# Patient Record
Sex: Female | Born: 1955
Health system: Southern US, Community
[De-identification: ages and names within clinical notes are randomized; demographics above are authoritative.]

## PROBLEM LIST (undated history)

## (undated) DIAGNOSIS — K219 Gastro-esophageal reflux disease without esophagitis: Secondary | ICD-10-CM

## (undated) DIAGNOSIS — M542 Cervicalgia: Secondary | ICD-10-CM

## (undated) DIAGNOSIS — M5416 Radiculopathy, lumbar region: Secondary | ICD-10-CM

## (undated) DIAGNOSIS — M549 Dorsalgia, unspecified: Secondary | ICD-10-CM

## (undated) DIAGNOSIS — K589 Irritable bowel syndrome without diarrhea: Secondary | ICD-10-CM

## (undated) DIAGNOSIS — T1490XA Injury, unspecified, initial encounter: Secondary | ICD-10-CM

## (undated) DIAGNOSIS — G8929 Other chronic pain: Secondary | ICD-10-CM

## (undated) DIAGNOSIS — I10 Essential (primary) hypertension: Secondary | ICD-10-CM

## (undated) DIAGNOSIS — E785 Hyperlipidemia, unspecified: Secondary | ICD-10-CM

## (undated) DIAGNOSIS — J449 Chronic obstructive pulmonary disease, unspecified: Secondary | ICD-10-CM

## (undated) DIAGNOSIS — G43909 Migraine, unspecified, not intractable, without status migrainosus: Secondary | ICD-10-CM

## (undated) DIAGNOSIS — K529 Noninfective gastroenteritis and colitis, unspecified: Secondary | ICD-10-CM

## (undated) DIAGNOSIS — K760 Fatty (change of) liver, not elsewhere classified: Secondary | ICD-10-CM

## (undated) DIAGNOSIS — Z86010 Personal history of colonic polyps: Secondary | ICD-10-CM

## (undated) HISTORY — DX: Essential (primary) hypertension: I10

## (undated) HISTORY — DX: Fatty (change of) liver, not elsewhere classified: K76.0

## (undated) HISTORY — PX: ABDOMINAL HYSTERECTOMY: SHX81

## (undated) HISTORY — DX: Gastro-esophageal reflux disease without esophagitis: K21.9

## (undated) HISTORY — PX: ROTATOR CUFF REPAIR: SHX139

## (undated) HISTORY — PX: SPINE SURGERY: SHX786

## (undated) HISTORY — DX: Hyperlipidemia, unspecified: E78.5

## (undated) HISTORY — PX: CERVICAL DISC SURGERY: SHX588

## (undated) HISTORY — DX: Personal history of colonic polyps: Z86.010

## (undated) HISTORY — PX: HERNIA REPAIR: SHX51

## (undated) HISTORY — PX: NECK SURGERY: SHX720

## (undated) HISTORY — PX: CHOLECYSTECTOMY: SHX55

## (undated) HISTORY — PX: CARPAL TUNNEL RELEASE: SHX101

## (undated) HISTORY — DX: Injury, unspecified, initial encounter: T14.90XA

## (undated) HISTORY — PX: FOOT SURGERY: SHX648

## (undated) HISTORY — DX: Noninfective gastroenteritis and colitis, unspecified: K52.9

## (undated) HISTORY — PX: FACIAL RECONSTRUCTION SURGERY: SHX631

## (undated) HISTORY — PX: FRACTURE SURGERY: SHX138

---

## 2003-04-14 DIAGNOSIS — Z86010 Personal history of colonic polyps: Secondary | ICD-10-CM

## 2003-04-14 DIAGNOSIS — Z860101 Personal history of adenomatous and serrated colon polyps: Secondary | ICD-10-CM

## 2003-04-14 HISTORY — DX: Personal history of colonic polyps: Z86.010

## 2003-04-14 HISTORY — DX: Personal history of adenomatous and serrated colon polyps: Z86.0101

## 2005-12-02 ENCOUNTER — Ambulatory Visit (HOSPITAL_COMMUNITY): Admission: RE | Admit: 2005-12-02 | Discharge: 2005-12-02 | Payer: Self-pay | Admitting: Podiatry

## 2006-02-15 ENCOUNTER — Ambulatory Visit (HOSPITAL_COMMUNITY): Admission: RE | Admit: 2006-02-15 | Discharge: 2006-02-15 | Payer: Self-pay | Admitting: Family Medicine

## 2006-04-15 ENCOUNTER — Ambulatory Visit (HOSPITAL_COMMUNITY): Admission: RE | Admit: 2006-04-15 | Discharge: 2006-04-15 | Payer: Self-pay | Admitting: Family Medicine

## 2006-06-16 ENCOUNTER — Ambulatory Visit (HOSPITAL_COMMUNITY): Admission: RE | Admit: 2006-06-16 | Discharge: 2006-06-16 | Payer: Self-pay | Admitting: Family Medicine

## 2006-07-19 ENCOUNTER — Observation Stay (HOSPITAL_COMMUNITY): Admission: RE | Admit: 2006-07-19 | Discharge: 2006-07-19 | Payer: Self-pay | Admitting: General Surgery

## 2007-03-21 ENCOUNTER — Observation Stay (HOSPITAL_COMMUNITY): Admission: RE | Admit: 2007-03-21 | Discharge: 2007-03-22 | Payer: Self-pay | Admitting: General Surgery

## 2007-04-14 DIAGNOSIS — K760 Fatty (change of) liver, not elsewhere classified: Secondary | ICD-10-CM

## 2007-04-14 HISTORY — DX: Fatty (change of) liver, not elsewhere classified: K76.0

## 2007-05-06 ENCOUNTER — Ambulatory Visit (HOSPITAL_COMMUNITY): Admission: RE | Admit: 2007-05-06 | Discharge: 2007-05-06 | Payer: Self-pay | Admitting: Family Medicine

## 2007-05-25 ENCOUNTER — Ambulatory Visit (HOSPITAL_COMMUNITY): Admission: RE | Admit: 2007-05-25 | Discharge: 2007-05-25 | Payer: Self-pay | Admitting: Family Medicine

## 2007-06-02 ENCOUNTER — Ambulatory Visit (HOSPITAL_COMMUNITY): Admission: RE | Admit: 2007-06-02 | Discharge: 2007-06-02 | Payer: Self-pay | Admitting: Family Medicine

## 2007-08-31 ENCOUNTER — Emergency Department (HOSPITAL_COMMUNITY): Admission: EM | Admit: 2007-08-31 | Discharge: 2007-08-31 | Payer: Self-pay | Admitting: Emergency Medicine

## 2007-09-08 ENCOUNTER — Ambulatory Visit: Payer: Self-pay | Admitting: Internal Medicine

## 2007-09-14 ENCOUNTER — Ambulatory Visit (HOSPITAL_COMMUNITY): Admission: RE | Admit: 2007-09-14 | Discharge: 2007-09-14 | Payer: Self-pay | Admitting: Internal Medicine

## 2007-09-14 ENCOUNTER — Encounter: Payer: Self-pay | Admitting: Internal Medicine

## 2007-09-14 ENCOUNTER — Ambulatory Visit: Payer: Self-pay | Admitting: Internal Medicine

## 2007-09-14 DIAGNOSIS — K219 Gastro-esophageal reflux disease without esophagitis: Secondary | ICD-10-CM

## 2007-09-14 HISTORY — PX: ESOPHAGOGASTRODUODENOSCOPY: SHX1529

## 2007-09-14 HISTORY — DX: Gastro-esophageal reflux disease without esophagitis: K21.9

## 2007-09-29 ENCOUNTER — Ambulatory Visit: Payer: Self-pay | Admitting: Internal Medicine

## 2007-12-07 ENCOUNTER — Ambulatory Visit: Payer: Self-pay | Admitting: Cardiology

## 2007-12-20 ENCOUNTER — Ambulatory Visit: Payer: Self-pay | Admitting: Cardiology

## 2007-12-20 ENCOUNTER — Ambulatory Visit (HOSPITAL_COMMUNITY): Admission: RE | Admit: 2007-12-20 | Discharge: 2007-12-20 | Payer: Self-pay | Admitting: Family Medicine

## 2008-01-02 ENCOUNTER — Ambulatory Visit (HOSPITAL_COMMUNITY): Admission: RE | Admit: 2008-01-02 | Discharge: 2008-01-02 | Payer: Self-pay | Admitting: Family Medicine

## 2008-01-09 ENCOUNTER — Ambulatory Visit: Payer: Self-pay | Admitting: Cardiology

## 2008-01-16 ENCOUNTER — Ambulatory Visit (HOSPITAL_COMMUNITY): Admission: RE | Admit: 2008-01-16 | Discharge: 2008-01-16 | Payer: Self-pay | Admitting: Family Medicine

## 2008-01-17 DIAGNOSIS — I10 Essential (primary) hypertension: Secondary | ICD-10-CM | POA: Insufficient documentation

## 2008-01-17 DIAGNOSIS — R079 Chest pain, unspecified: Secondary | ICD-10-CM | POA: Insufficient documentation

## 2008-01-17 DIAGNOSIS — R002 Palpitations: Secondary | ICD-10-CM | POA: Insufficient documentation

## 2008-01-17 DIAGNOSIS — E785 Hyperlipidemia, unspecified: Secondary | ICD-10-CM | POA: Insufficient documentation

## 2008-02-02 ENCOUNTER — Ambulatory Visit: Payer: Self-pay | Admitting: Cardiology

## 2008-05-01 ENCOUNTER — Ambulatory Visit: Payer: Self-pay | Admitting: Cardiology

## 2008-06-06 ENCOUNTER — Emergency Department (HOSPITAL_COMMUNITY): Admission: EM | Admit: 2008-06-06 | Discharge: 2008-06-06 | Payer: Self-pay | Admitting: Emergency Medicine

## 2008-06-13 ENCOUNTER — Ambulatory Visit (HOSPITAL_COMMUNITY): Admission: RE | Admit: 2008-06-13 | Discharge: 2008-06-13 | Payer: Self-pay | Admitting: Family Medicine

## 2008-07-12 ENCOUNTER — Ambulatory Visit (HOSPITAL_COMMUNITY): Admission: RE | Admit: 2008-07-12 | Discharge: 2008-07-12 | Payer: Self-pay | Admitting: Neurosurgery

## 2008-09-20 ENCOUNTER — Encounter: Payer: Self-pay | Admitting: Cardiology

## 2008-11-30 ENCOUNTER — Ambulatory Visit: Payer: Self-pay | Admitting: Vascular Surgery

## 2008-12-03 ENCOUNTER — Ambulatory Visit: Payer: Self-pay | Admitting: Vascular Surgery

## 2009-02-11 ENCOUNTER — Encounter (HOSPITAL_COMMUNITY): Admission: RE | Admit: 2009-02-11 | Discharge: 2009-03-13 | Payer: Self-pay | Admitting: Orthopedic Surgery

## 2009-04-15 ENCOUNTER — Ambulatory Visit (HOSPITAL_COMMUNITY): Admission: RE | Admit: 2009-04-15 | Discharge: 2009-04-15 | Payer: Self-pay | Admitting: General Surgery

## 2009-06-03 ENCOUNTER — Ambulatory Visit (HOSPITAL_COMMUNITY): Admission: RE | Admit: 2009-06-03 | Discharge: 2009-06-03 | Payer: Self-pay | Admitting: Family Medicine

## 2009-07-14 ENCOUNTER — Emergency Department (HOSPITAL_COMMUNITY): Admission: EM | Admit: 2009-07-14 | Discharge: 2009-07-14 | Payer: Self-pay | Admitting: Emergency Medicine

## 2009-07-22 ENCOUNTER — Observation Stay (HOSPITAL_COMMUNITY): Admission: RE | Admit: 2009-07-22 | Discharge: 2009-07-23 | Payer: Self-pay | Admitting: General Surgery

## 2009-08-21 ENCOUNTER — Ambulatory Visit (HOSPITAL_COMMUNITY): Admission: RE | Admit: 2009-08-21 | Discharge: 2009-08-21 | Payer: Self-pay | Admitting: Family Medicine

## 2009-09-23 DIAGNOSIS — R197 Diarrhea, unspecified: Secondary | ICD-10-CM

## 2009-09-23 DIAGNOSIS — K219 Gastro-esophageal reflux disease without esophagitis: Secondary | ICD-10-CM | POA: Insufficient documentation

## 2009-09-23 DIAGNOSIS — K589 Irritable bowel syndrome without diarrhea: Secondary | ICD-10-CM

## 2009-09-23 DIAGNOSIS — M129 Arthropathy, unspecified: Secondary | ICD-10-CM | POA: Insufficient documentation

## 2009-09-23 DIAGNOSIS — Z86010 Personal history of colonic polyps: Secondary | ICD-10-CM | POA: Insufficient documentation

## 2009-09-23 DIAGNOSIS — Z860101 Personal history of adenomatous and serrated colon polyps: Secondary | ICD-10-CM | POA: Insufficient documentation

## 2009-09-23 DIAGNOSIS — M549 Dorsalgia, unspecified: Secondary | ICD-10-CM | POA: Insufficient documentation

## 2009-09-24 ENCOUNTER — Ambulatory Visit: Payer: Self-pay | Admitting: Internal Medicine

## 2009-09-24 ENCOUNTER — Encounter: Payer: Self-pay | Admitting: Gastroenterology

## 2009-09-24 DIAGNOSIS — R1011 Right upper quadrant pain: Secondary | ICD-10-CM

## 2009-09-24 DIAGNOSIS — R1310 Dysphagia, unspecified: Secondary | ICD-10-CM | POA: Insufficient documentation

## 2009-09-26 ENCOUNTER — Ambulatory Visit (HOSPITAL_COMMUNITY): Admission: RE | Admit: 2009-09-26 | Discharge: 2009-09-26 | Payer: Self-pay | Admitting: Gastroenterology

## 2009-09-26 LAB — CONVERTED CEMR LAB
AST: 20 units/L (ref 0–37)
Albumin: 4.5 g/dL (ref 3.5–5.2)
Alkaline Phosphatase: 103 units/L (ref 39–117)
BUN: 8 mg/dL (ref 6–23)
Basophils Absolute: 0 10*3/uL (ref 0.0–0.1)
Lymphocytes Relative: 35 % (ref 12–46)
Neutro Abs: 3 10*3/uL (ref 1.7–7.7)
Neutrophils Relative %: 56 % (ref 43–77)
Platelets: 207 10*3/uL (ref 150–400)
Potassium: 4 meq/L (ref 3.5–5.3)
RDW: 15.3 % (ref 11.5–15.5)
Sodium: 138 meq/L (ref 135–145)
Tissue Transglutaminase Ab, IgA: 5.2 units (ref <20)
Total Protein: 7.3 g/dL (ref 6.0–8.3)

## 2009-09-30 ENCOUNTER — Encounter: Payer: Self-pay | Admitting: Internal Medicine

## 2009-10-21 ENCOUNTER — Telehealth (INDEPENDENT_AMBULATORY_CARE_PROVIDER_SITE_OTHER): Payer: Self-pay

## 2009-10-30 ENCOUNTER — Encounter (INDEPENDENT_AMBULATORY_CARE_PROVIDER_SITE_OTHER): Payer: Self-pay

## 2009-11-08 ENCOUNTER — Encounter: Payer: Self-pay | Admitting: Internal Medicine

## 2009-11-11 ENCOUNTER — Ambulatory Visit (HOSPITAL_COMMUNITY): Admission: RE | Admit: 2009-11-11 | Discharge: 2009-11-11 | Payer: Self-pay | Admitting: Internal Medicine

## 2009-11-11 ENCOUNTER — Telehealth (INDEPENDENT_AMBULATORY_CARE_PROVIDER_SITE_OTHER): Payer: Self-pay | Admitting: *Deleted

## 2009-11-11 ENCOUNTER — Ambulatory Visit: Payer: Self-pay | Admitting: Internal Medicine

## 2009-11-11 HISTORY — PX: ESOPHAGOGASTRODUODENOSCOPY: SHX1529

## 2009-11-12 ENCOUNTER — Telehealth (INDEPENDENT_AMBULATORY_CARE_PROVIDER_SITE_OTHER): Payer: Self-pay | Admitting: *Deleted

## 2009-11-12 ENCOUNTER — Encounter (INDEPENDENT_AMBULATORY_CARE_PROVIDER_SITE_OTHER): Payer: Self-pay | Admitting: *Deleted

## 2009-11-13 ENCOUNTER — Telehealth (INDEPENDENT_AMBULATORY_CARE_PROVIDER_SITE_OTHER): Payer: Self-pay | Admitting: *Deleted

## 2009-12-06 ENCOUNTER — Encounter: Payer: Self-pay | Admitting: Internal Medicine

## 2010-02-12 ENCOUNTER — Ambulatory Visit: Payer: Self-pay | Admitting: Internal Medicine

## 2010-02-12 DIAGNOSIS — K625 Hemorrhage of anus and rectum: Secondary | ICD-10-CM

## 2010-02-12 DIAGNOSIS — R109 Unspecified abdominal pain: Secondary | ICD-10-CM | POA: Insufficient documentation

## 2010-05-04 ENCOUNTER — Encounter: Payer: Self-pay | Admitting: Family Medicine

## 2010-05-05 ENCOUNTER — Encounter: Payer: Self-pay | Admitting: Family Medicine
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
## 2010-05-13 NOTE — Letter (Signed)
Summary: OFFICE NOTE FROM [facility]  OFFICE NOTE FROM [facility]   Imported By: Rexene Alberts 12/06/2009 09:41:10  _____________________________________________________________________  External Attachment:    Type:   Image     Comment:   External Document

## 2010-05-13 NOTE — Letter (Signed)
Summary: husband wants a call  Patient's husband desperately wants to speak with you RE: yesterday's results and further description of what could be causing her problems.  He felt as if he didn't get the entire picture at the time and is concerned previous surgeries could have caused severe damage.  Appended Document: husband wants a call Called Mr. Bo back to let him know that Dr. Jena Gauss is aware that he wishes to speak with him and that he will contact him in an appropriate time frame.  And that Mrs. Berthelot has been scheduled to see Dr. Chrisandra Carota at [facility] on 12/19/09 @ 2pm.  He documented the appt and I stated they would send her paperwork and directions.  Appt made with [facility].  Appended Document: husband wants a call called pts husband and reiterated findings and recommendations from yesterday's encounter. I answered all of his questions. He states he would like to speak with me after she has seen the  doctor at [facility]. I told him I would be glad to review the input we get from them once it becomes available.  Appended Document: husband wants a call done

## 2010-05-13 NOTE — Progress Notes (Signed)
Summary: scheduling referral to [facility] for second opinion  Phone Note From Other Clinic   Summary of Call: RMR called the office and spoke with Soledad Gerlach. He wants pt to have a referral to [facility] in the next several weeks for a second opinion. Initial call taken by: Diana Eves,  November 11, 2009 3:53 PM

## 2010-05-13 NOTE — Progress Notes (Signed)
----   Converted from flag ---- ---- 10/20/2009 6:16 PM, R. Roetta Sessions MD, Caleen Essex wrote: Discussed at length with LSL; will go aheaad and offer pt EGD/ED to see if this will help dysphagia signs;  pleease get her on schedule ---- 10/18/2009 11:29 AM, Leanna Battles. Dixon Boos wrote: Do you think we should offer EGD/ED? See OV note, had ugi series on 5/11. If so, can you please let nursing know to schedule it. ------------------------------  Appended Document:  LMOM to call.LMOM to call.  Appended Document:  LMOM to call.  Appended Document:  Mailed letter to call.

## 2010-05-13 NOTE — Letter (Signed)
Summary: referral-sarah bruce,P[facility]/Dr. Marisue Ivan bruce,[facility]/Dr. McGough   Imported By: Rosine Beat 09/30/2009 15:00:24  _____________________________________________________________________  External Attachment:    Type:   Image     Comment:   External Document

## 2010-05-13 NOTE — Letter (Signed)
Summary: Internal Other Magdalene Molly for EGD/ED  Internal Other Magdalene Molly for EGD/ED   Imported By: Cloria Spring LPN 09/81/1914 78:29:56  _____________________________________________________________________  External Attachment:    Type:   Image     Comment:   External Document

## 2010-05-13 NOTE — Letter (Signed)
Summary: Plan of Care, Need to Discuss  [facility]  [address]   [place], [place] [address]   Phone: [phone]  Fax: [phone]    October 30, 2009  [name] [address] [place], [place]  [address] October 07, 1955   Dear [name],   We are writing this letter to inform you of treatment plans and/or discuss your plan of care.  We have tried several times to contact you; however, we have yet to reach you.  We ask that you please contact our office for follow-up on your gastrointestinal issues.  We can  be reached at [phone] to schedule an appointment, or to speak with someone regarding your health care needs.  Please do not neglect your health.   Sincerely,    Cloria Spring LPN  [facility] Ph: [phone]    Fax: [phone]

## 2010-05-13 NOTE — Letter (Signed)
Summary: CT SCAN ORDER  CT SCAN ORDER   Imported By: Ave Filter 09/24/2009 11:36:27  _____________________________________________________________________  External Attachment:    Type:   Image     Comment:   External Document

## 2010-05-13 NOTE — Progress Notes (Signed)
Summary: [facility] referral DX  ---- Converted from flag ---- ---- 11/12/2009 12:52 PM, Jonathon Bellows MD, Caleen Essex wrote: regarding her abdominal wall hernias - last CT on CD ROM from here  needs to go w patient  ------------------------------
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
## 2010-05-13 NOTE — Progress Notes (Signed)
  Phone Note Call from Patient   Summary of Call: Soledad Gerlach took call from pt's husband and wanted to know the status of her appt at [facility].  He was told that Marchelle Folks was out of the office and he should call back tomorrow to speak with her.  Initial call taken by: Diana Eves,  November 13, 2009 4:08 PM     Appended Document:  Mr Speece called again this morning to speak with office manager and was told that she was not in today. He continues to tell me of his wife's condition and wants to know what is being done. I read him the note from Tuesday of his and Mandy's conversation and he does not remember this. He documented what I read about pt's referral to see Dr Barnetta Chapel on 12/19/09 @ 2pm and I gave him the number to reach their office. Mr Zarr was satisfied with that and thanked me. Call ended.  Appended Document:  Pt has an appt to see LL on 12/05/09 for further follow up for chronic diarrhea - She is scheduled for [facility] appt on 12/19/09- cdg

## 2010-05-13 NOTE — Letter (Signed)
Summary: OFFICE NOTE FROM [facility]  OFFICE NOTE FROM [facility]   Imported By: Rexene Alberts 12/06/2009 09:43:05  _____________________________________________________________________  External Attachment:    Type:   Image     Comment:   External Document

## 2010-05-16 ENCOUNTER — Emergency Department (HOSPITAL_COMMUNITY): Admit: 2010-05-16 | Discharge: 2010-05-16 | Disposition: A | Discharge status: Home / Self Care | Payer: Medicare Other

## 2010-05-16 ENCOUNTER — Emergency Department (HOSPITAL_COMMUNITY)
Admission: EM | Admit: 2010-05-16 | Discharge: 2010-05-16 | Disposition: A | Discharge status: Home / Self Care | Payer: Medicare Other | Attending: Emergency Medicine | Admitting: Emergency Medicine

## 2010-05-16 DIAGNOSIS — S301XXA Contusion of abdominal wall, initial encounter: Secondary | ICD-10-CM | POA: Insufficient documentation

## 2010-05-16 DIAGNOSIS — Y92009 Unspecified place in unspecified non-institutional (private) residence as the place of occurrence of the external cause: Secondary | ICD-10-CM | POA: Insufficient documentation

## 2010-05-16 DIAGNOSIS — S20229A Contusion of unspecified back wall of thorax, initial encounter: Secondary | ICD-10-CM | POA: Insufficient documentation

## 2010-05-16 DIAGNOSIS — W010XXA Fall on same level from slipping, tripping and stumbling without subsequent striking against object, initial encounter: Secondary | ICD-10-CM | POA: Insufficient documentation

## 2010-07-02 LAB — BASIC METABOLIC PANEL
BUN: 4 mg/dL — ABNORMAL LOW (ref 6–23)
BUN: 8 mg/dL (ref 6–23)
CO2: 31 mEq/L (ref 19–32)
Calcium: 8.6 mg/dL (ref 8.4–10.5)
Chloride: 104 mEq/L (ref 96–112)
Creatinine, Ser: 0.81 mg/dL (ref 0.4–1.2)
GFR calc Af Amer: >60 mL/min (ref >60)
Glucose, Bld: 118 mg/dL — ABNORMAL HIGH (ref 70–99)
Potassium: 4.1 mEq/L (ref 3.5–5.1)

## 2010-07-02 LAB — CBC
HCT: 38.9 % (ref 36.0–46.0)
Hemoglobin: 13.6 g/dL (ref 12.0–15.0)
MCHC: 35.1 g/dL (ref 30.0–36.0)
MCV: 91 fL (ref 78.0–100.0)
MCV: 91 fL (ref 78.0–100.0)
Platelets: 156 10*3/uL (ref 150–400)
RDW: 14 % (ref 11.5–15.5)
WBC: 5.8 10*3/uL (ref 4.0–10.5)

## 2010-07-02 LAB — DIFFERENTIAL
Basophils Absolute: 0 10*3/uL (ref 0.0–0.1)
Basophils Relative: 0 % (ref 0–1)
Eosinophils Absolute: 0 10*3/uL (ref 0.0–0.7)
Eosinophils Absolute: 0.1 10*3/uL (ref 0.0–0.7)
Eosinophils Relative: 1 % (ref 0–5)
Lymphs Abs: 1.9 10*3/uL (ref 0.7–4.0)
Monocytes Absolute: 0.4 10*3/uL (ref 0.1–1.0)
Monocytes Relative: 7 % (ref 3–12)
Neutro Abs: 8.3 10*3/uL — ABNORMAL HIGH (ref 1.7–7.7)
Neutrophils Relative %: 83 % — ABNORMAL HIGH (ref 43–77)

## 2010-07-14 LAB — BASIC METABOLIC PANEL
BUN: 9 mg/dL (ref 6–23)
Calcium: 9.4 mg/dL (ref 8.4–10.5)
GFR calc non Af Amer: >60 mL/min (ref >60)
Potassium: 4.1 mEq/L (ref 3.5–5.1)
Sodium: 137 mEq/L (ref 135–145)

## 2010-07-14 LAB — CBC
HCT: 40.5 % (ref 36.0–46.0)
Hemoglobin: 13.7 g/dL (ref 12.0–15.0)
Platelets: 170 10*3/uL (ref 150–400)
RDW: 14.3 % (ref 11.5–15.5)
WBC: 5.9 10*3/uL (ref 4.0–10.5)

## 2010-07-16 ENCOUNTER — Emergency Department (HOSPITAL_COMMUNITY): Payer: Medicare Other

## 2010-07-16 ENCOUNTER — Encounter (HOSPITAL_COMMUNITY): Payer: Self-pay | Admitting: Radiology

## 2010-07-16 ENCOUNTER — Emergency Department (HOSPITAL_COMMUNITY)
Admission: EM | Admit: 2010-07-16 | Discharge: 2010-07-16 | Disposition: A | Discharge status: Home / Self Care | Payer: Medicare Other | Attending: Emergency Medicine | Admitting: Emergency Medicine

## 2010-07-16 DIAGNOSIS — R109 Unspecified abdominal pain: Secondary | ICD-10-CM | POA: Insufficient documentation

## 2010-07-16 DIAGNOSIS — R141 Gas pain: Secondary | ICD-10-CM | POA: Insufficient documentation

## 2010-07-16 DIAGNOSIS — R143 Flatulence: Secondary | ICD-10-CM | POA: Insufficient documentation

## 2010-07-16 DIAGNOSIS — K7689 Other specified diseases of liver: Secondary | ICD-10-CM | POA: Insufficient documentation

## 2010-07-16 DIAGNOSIS — I10 Essential (primary) hypertension: Secondary | ICD-10-CM | POA: Insufficient documentation

## 2010-07-16 DIAGNOSIS — R142 Eructation: Secondary | ICD-10-CM | POA: Insufficient documentation

## 2010-07-16 DIAGNOSIS — E785 Hyperlipidemia, unspecified: Secondary | ICD-10-CM | POA: Insufficient documentation

## 2010-07-16 DIAGNOSIS — K432 Incisional hernia without obstruction or gangrene: Secondary | ICD-10-CM | POA: Insufficient documentation

## 2010-07-16 LAB — CBC
HCT: 37.5 % (ref 36.0–46.0)
Hemoglobin: 12.5 g/dL (ref 12.0–15.0)
MCV: 89.9 fL (ref 78.0–100.0)
RDW: 13.8 % (ref 11.5–15.5)
WBC: 4.5 10*3/uL (ref 4.0–10.5)

## 2010-07-16 LAB — URINALYSIS, ROUTINE W REFLEX MICROSCOPIC
Glucose, UA: NEGATIVE mg/dL
Ketones, ur: NEGATIVE mg/dL
Nitrite: NEGATIVE
Protein, ur: NEGATIVE mg/dL
Urobilinogen, UA: 0.2 mg/dL (ref 0.0–1.0)

## 2010-07-16 LAB — COMPREHENSIVE METABOLIC PANEL
ALT: 28 U/L (ref 0–35)
Alkaline Phosphatase: 85 U/L (ref 39–117)
BUN: 2 mg/dL — ABNORMAL LOW (ref 6–23)
CO2: 26 mEq/L (ref 19–32)
Chloride: 104 mEq/L (ref 96–112)
GFR calc non Af Amer: >60 mL/min (ref >60)
Glucose, Bld: 113 mg/dL — ABNORMAL HIGH (ref 70–99)
Potassium: 3.5 mEq/L (ref 3.5–5.1)
Sodium: 136 mEq/L (ref 135–145)
Total Bilirubin: 0.2 mg/dL — ABNORMAL LOW (ref 0.3–1.2)

## 2010-07-16 LAB — DIFFERENTIAL
Basophils Absolute: 0 10*3/uL (ref 0.0–0.1)
Eosinophils Relative: 3 % (ref 0–5)
Lymphocytes Relative: 28 % (ref 12–46)
Lymphs Abs: 1.3 10*3/uL (ref 0.7–4.0)
Neutro Abs: 2.7 10*3/uL (ref 1.7–7.7)

## 2010-07-16 LAB — URINE MICROSCOPIC-ADD ON

## 2010-07-16 LAB — OCCULT BLOOD, POC DEVICE: Fecal Occult Bld: NEGATIVE

## 2010-07-16 MED ORDER — IOHEXOL 300 MG/ML  SOLN
100.0000 mL | Freq: Once | INTRAMUSCULAR | Status: AC | PRN
  Administered 2010-07-16: 100 mL via INTRAVENOUS

## 2010-07-18 LAB — URINE CULTURE
Colony Count: 75000
Culture  Setup Time: 201204042109

## 2010-08-26 NOTE — H&P (Signed)
NAME:  [name], [name]            ACCOUNT NO.:  [id number]   MEDICAL RECORD NO.:  [id number]          PATIENT TYPE:  AMB   LOCATION:  DAY                           FACILITY:  [facility]   PHYSICIAN:  Dalia Heading, M.D.  DATE OF BIRTH:  [birth year]   DATE OF ADMISSION:  DATE OF DISCHARGE:  LH                              HISTORY & PHYSICAL   AGE:  55 years old.   CHIEF COMPLAINT:  Recurrent incisional hernia.   HISTORY OF PRESENT ILLNESS:  The patient is a 55 year old white female  who is status post multiple abdominal surgeries and incisional  herniorrhaphies in the past, who now presents with recurrence of a  suprapubic incisional hernia.  She did have a repair earlier this year  with mesh, but she has broken through lateral to this repair.   PAST MEDICAL HISTORY:  As noted above, arthritis.   PAST SURGICAL HISTORY:  1. Multiple extremity surgeries.  2. Hysterectomy.  3. Incisional herniorrhaphies with mesh.   CURRENT MEDICATIONS:  1. Vytorin.  2. Cyclobenzaprine.  3. Naprosyn.  4. Citalopram.   ALLERGIES:  NO KNOWN DRUG ALLERGIES.   SOCIAL HISTORY:  The patient smokes half a pack of cigarettes a day.  She denies any significant alcohol use.   REVIEW OF SYSTEMS:  She denies any other cardiopulmonary difficulties or  bleeding disorders.   PHYSICAL EXAMINATION:  GENERAL:  The patient is a well-developed, well-  nourished white female in no acute distress.  LUNGS:  Clear to auscultation with equal breath sounds bilaterally.  HEART:  Regular rate and rhythm without S3, S4 or murmurs.  ABDOMEN:  Soft and nondistended.  She is tender in the suprapubic region  with a large reducible hernia present.   IMPRESSION:  Recurrent incisional hernia.   PLAN:  The patient is scheduled for a laparoscopic recurrent incisional  herniorrhaphy with mesh on March 21, 2007.  The risks and benefits of  the procedure including bleeding, infection, bowel injury, recurrence of  the hernia,  the possibility of an open procedure were fully explained to  the patient and gave informed consent.      Dalia Heading, M.D.  Electronically Signed     MAJ/MEDQ  D:  03/17/2007  T:  03/17/2007  Job:  [id number]   cc:   Dalia Heading, M.D.  Fax: [phone]   Corrie Mckusick, M.D.  Fax: [phone]

## 2010-08-26 NOTE — Op Note (Signed)
NAMEBELKY, [name]            ACCOUNT NO.:  [id number]   MEDICAL RECORD NO.:  [id number]          PATIENT TYPE:  OBV   LOCATION:  A310                          FACILITY:  [facility]   PHYSICIAN:  Dalia Heading, M.D.  DATE OF BIRTH:  1956/04/07   DATE OF PROCEDURE:  03/21/2007  DATE OF DISCHARGE:                               OPERATIVE REPORT   PREOPERATIVE DIAGNOSIS:  Recurrent incisional hernia.   POSTOPERATIVE DIAGNOSIS:  Recurrent incisional hernia.   PROCEDURE:  Laparoscopic incisional herniorrhaphy with mesh.   SURGEON:  Dr. Franky Macho.   ASSISTANT:  Dr. Tilford Pillar.   ANESTHESIA:  General endotracheal.   INDICATIONS:  The patient is a 55 year old white female status post  multiple abdominal surgeries and incisional herniorrhaphy in the past,  who now presents with recurrent incisional hernia.  Risks and benefits  of the procedure including bleeding, infection, bowel injury, possible  recurrence of the hernia, and the possibility of an open procedure were  fully explained to the patient who gave informed consent.   PROCEDURE NOTE:  The patient was placed in the supine position.  After  induction of general endotracheal anesthesia, the abdomen was prepped  and draped in the usual sterile technique with Betadine.  Surgical site  confirmation was performed.   A small epigastric incision was made and the Veress needle was placed  into the abdominal cavity without difficulty.  Confirmation of placement  was done using the saline drop test.  The abdomen was then insufflated  to 16 mmHg pressure.  An 11 mm trocar was entered was then introduced  into the left flank under direct visualization without difficulty.  An  additional 5-mm trocar was placed on the right side of the abdominal  wall an 11-mm trocar was placed in the left lower quadrant region.  The  hernia defect was large and noted to have omentum in it.  This was freed  away and the hernia sac was excised using  the LigaSure without  difficulty.  The defect measured greater than 15 cm in its greatest  diameter.  Thus, a 15 x 20 cm piece of Proceed surgical mesh was placed.  2-0 Novofil tacking sutures were used as well as the Protacker.  No  abnormal bleeding was noted at the end of the procedure.  All air was  then evacuated from the abdominal cavity prior to removal of the  trocars.   All wounds were irrigated with normal saline.  All wounds were checked  with 0.5 cm Sensorcaine.  The 11-mm trocar site fascial edges were  reapproximated using 0 Vicryl interrupted sutures.  All skin incisions  were closed using staples and Dermabond.   All tape and needle counts correct at the end of the procedure.  The  patient was extubated in the operating room and went back to recovery  room awake in stable condition.   COMPLICATIONS:  None.   SPECIMEN:  None.   BLOOD LOSS:  Minimal.      Dalia Heading, M.D.  Electronically Signed     MAJ/MEDQ  D:  03/21/2007  T:  03/21/2007  Job:  [id number]   cc:   Corrie Mckusick, M.D.  Fax: [phone]

## 2010-08-26 NOTE — Consult Note (Signed)
NAME:  [name], [name]            ACCOUNT NO.:  [id number]   MEDICAL RECORD NO.:  [id number]          PATIENT TYPE:  AMB   LOCATION:  DAY                           FACILITY:  [facility]   PHYSICIAN:  R. Roetta Sessions, M.D. DATE OF BIRTH:  08-24-1955   DATE OF CONSULTATION:  DATE OF DISCHARGE:                                 CONSULTATION   REQUESTING PHYSICIAN:  Rhae Lerner. Margretta Ditty, MD   PRIMARY CARE PHYSICIAN:  [name], MD   REASON FOR CONSULTATION:  Abdominal pain and chronic diarrhea.   HISTORY OF PRESENT ILLNESS:  [name] is a 55 year old Caucasian  female.  She began to have severe epigastric pain about 3 months ago.  She describes this as a terrible pain.  She rates pain 10/10 on pain  scale.  She had a previous surgery for incisional hernia by Dr. Lovell Sheehan  in April 2008.  She had a second surgery on March 21, 2007, for  recurrent incisional hernia with mesh placement.  She continues to  complain of epigastric pain, which does seem to radiate to her back.  She complains of constant back pain and a chronic pressure.  She  complains of pain down her right side.  She complains of pain all the  time.  She tells me that she cannot sleep.  She is taking Percocet and  tells me it barely touches it.  She tells me that she does not take  off the edge.  She complains of nausea and has had vomiting.  On  occasion, she has had a 1-week trial of Prevacid 30 mg daily, which did  not seem to help.  She denies any history of heartburn indigestion.  Denies any dysphagia or odynophagia.  She has had anorexia.  She tells  me she has lost 20 pounds in the last 6 months.  She tells me she has  had chronic diarrhea for the last 2 months.  She is having upwards of  six stools per day, which are loose, watery, and nonbloody.  She denies  any melena.  She denies any NSAID use.  She tells me she was referred to  [facility], given her chronic back pain, but she  has not  received her appointment time yet.  She has been started on  Questran for diarrhea, this does seem to help some.   CT of the abdomen and pelvis with contrast on Aug 31, 2007, shows mild  hepatic fatty infiltration status post cholecystectomy.  Postoperative  changes; post hernia repair, anterior abdominal wall, and normal pelvis  status post hysterectomy.   PAST MEDICAL AND SURGICAL HISTORY:  Past medical and surgical history is  extensive.  She tells me she had a colonoscopy 4 years ago in [place],  which showed polyp unsure as to whether these were adenomatous.  She had  an EGD at the same time and she believes this was normal.  She has had a  partial hysterectomy followed by bilateral salpingo-oophorectomy.  She  had a cholecystectomy 3 years ago.  She has had three C-sections.  She  has had cervical  disk surgery.  She has had left rotator cuff repair and  carpal tunnel and trigger finger surgeries.  She has had a tubal  pregnancy.  She has had arthroscopy to her right knee.  She has had foot  surgery.  She has had three back surgeries.  Reconstructive face  surgery.  She had a multiple incisional herniorrhaphy with mesh  placement by Dr. Lovell Sheehan.  She has history of arthritis.   LABORATORY STUDIES:  Laboratory studies from the same date show, white  blood cell count of 5.4, hemoglobin 14.7, hematocrit 41, and platelets  187.  Lipase 27.  Sodium 137, potassium 3.9, chloride 103, carbon  dioxide 27, glucose 98, BUN 6, creatinine 0.68, calcium 9.5, total  protein 7, albumin 3.9, AST 23, and ALT 24.  Trace hemoglobin in her  urine.  MRI of lumbar spine with and without contrast on May 25, 2007, mild disk bulge at L2-L3 with nerve compression and diskogenic  edema anteriorly with in the L3 vertebral body.  Lumbar spine films on  May 06, 2007, no acute finding.   CURRENT MEDICATIONS:  Cholestyramine 2 scoops b.i.d. and Percocet p.r.n.   ALLERGIES:  DARVOCET.   FAMILY  HISTORY:  There is no known family history of colon or rectal  carcinoma.  She does have one brother who has history of alcoholism and  what sounds like hepatocellular carcinoma.  Mother of age 29 is diabetic  with hypertension.  Father is healthy.  She has 10 healthy siblings all  together.   SOCIAL HISTORY:  [name] is divorced, but has had a significant  other for about 14 years now.  She is disabled.  She smokes half-a-pack  of cigarettes per day.  She denies any alcohol or drug use.   REVIEW OF SYSTEMS:  See HPI, otherwise negative.   PHYSICAL EXAMINATION:  VITAL SIGNS:  Weight 147 pounds, height 61  inches, temperature 97.9, blood pressure 150/78, and pulse 84.  GENERAL:  [name] is a well-developed, well-nourished Caucasian  female in no acute distress.  HEENT:  Sclerae clear, nonicteric, conjunctivae pink.  Oropharynx pink  and moist without any lesions.  NECK:  Supple without any mass or thyromegaly.  HEART:  Regular rate and rhythm.  Normal S1 and S2.  No murmurs, clicks,  rubs, or gallops.  LUNGS:  Clear to auscultation bilaterally.  ABDOMEN:  Positive bowel sounds x4.  No bruits auscultated.  Soft,  nontender, and nondistended without palpable mass or hepatosplenomegaly.  No rebound tenderness or guarding.  Well-healed surgical scars.  EXTREMITIES:  Without clubbing or edema bilaterally.  SKIN:  Pink, warm, and dry without any rash or jaundice.   IMPRESSION:  [name] is a 55 year old female with a 80-month history  of severe epigastric pain and nausea.  She also has a history of chronic  back pain with multiple surgeries.  She has actually been referred for  pain management for chronic back pain and new 35-month history of chronic  nonbloody diarrhea, etiology unknown.  I suspect, she could have either  post cholecystectomy diarrhea versus microscopic colitis.  She had had  significant weight loss as well and this is going to require further  evaluation to  rule out occult colorectal carcinoma as well as peptic  ulcer disease given her epigastric pain and nausea.   PLAN:  1. I do agree that she should proceed with the Pain Clinic evaluation,      given her chronic back pain and I  have deferred her back to her      primary care physician Dr. Nobie Putnam for distribution of narcotic      pain medications, as I feel it is in her best interest to have all      of her pain medications coming from one source.  She does have      Percocet left over at this time from the emergency room.  2. Colonoscopy and EGD with Dr. Jena Gauss in near future.  I have      discussed the procedure including risks and benefits which include,      but not limited to bleeding, infection, perforation, drug reaction.      She agrees with the      plan and consent will be obtained.  3. Full set of stool studies, which include C. Diff, ova and      parasites, culture and sensitivity, and lactoferrin.   Thank you Dr. Margretta Ditty for the opportunity to participate in the care of  [name].      Lorenza Burton, N.P.      Jonathon Bellows, M.D.  Electronically Signed    KJ/MEDQ  D:  09/08/2007  T:  09/09/2007  Job:  [id number]   cc:   [name], M.D.  Fax: [phone]

## 2010-08-26 NOTE — Procedures (Signed)
NAMESEJAL, COFIELD             ACCOUNT NO.:  [id number]   MEDICAL RECORD NO.:  [id number]          PATIENT TYPE:  OUT   LOCATION:  RESP                          FACILITY:  [facility]   PHYSICIAN:  Edward L. Juanetta Gosling, M.D.DATE OF BIRTH:  Aug 11, 1955   DATE OF PROCEDURE:  DATE OF DISCHARGE:                            PULMONARY FUNCTION TEST   Pulmonary Function Test  1. Spirometry shows no ventilatory defect, but does show evidence of      airflow obstruction in the smaller airways.  2. Lung volumes are normal.  3. DLCO is normal.  4. Arterial blood gases are normal.  5. The airflow obstruction in the smaller airways is consistent with      the clinical diagnosis of COPD.      Edward L. Juanetta Gosling, M.D.  Electronically Signed     ELH/MEDQ  D:  01/03/2008  T:  01/03/2008  Job:  [id number]   cc:   Kirk Ruths, M.D.  Fax: [phone]

## 2010-08-26 NOTE — Letter (Signed)
December 07, 2007    [name], M.D.  [address]  [place], [place]  [address]   RE:  KORDELIA, SEVERIN  MRN:  [id number]  /  DOB:  [birth year]   Dear Loraine Leriche:   It was my pleasure evaluating Ms. Wack in the office today in  consultation at your request for palpitations.  As you know, this nice  woman has had wealth of medical problems, but no cardiovascular issues.  She has never been seen by a cardiologist.  She has never undergone any  significant cardiac testing.  Over the past 10 days or so, she has noted  episodes of rapid heart action associated with malaise.  These occur at  rest and resolve spontaneously after number of minutes.  There has been  no chest discomfort associated with palpitations, but she has had some  right shoulder pain radiating to the chest with associated numbness in  her right hand and a clammy sensation in the right hand.   Past medical history is notable for dyslipidemia for which she is  treated pharmacologically.  She has a history of GERD.  She was  evaluated by Dr. Jena Gauss earlier this year for chronic diarrhea with a  possible diagnosis of IBS.  Adenomatous polyps were excised in 2005.  She has had multiple surgeries including partial hysterectomy and  subsequent bilateral oophorectomy, cholecystectomy, three cesarean  sections, cervical disk surgery, left rotator cuff repair, carpal tunnel  repair and multiple lumbosacral spinal procedures following an injury.  She has required a number of surgeries for incisional hernias with a  more recent mesh placement.  She also has a history of DJD.   She has no known allergies.   MEDICATION:  Her only medication is Vytorin 10/20 mg daily.   SOCIAL HISTORY:  Disabled; recently married to a companion of 14 years;  two adult children.   FAMILY HISTORY:  Positive for diabetes, hypertension, hyperlipidemia,  and myocardial infarction in one of four siblings.   REVIEW OF SYSTEMS:  Notable for intermittent  dizziness, the need for  corrective lenses, mild chronic cough, variable weight, a somewhat  impaired appetite.  All other systems reviewed and are negative.   PHYSICAL EXAMINATION:  GENERAL:  On exam, trim somewhat anxious woman in  no acute distress.  The height is 5 feet 1 inch, weight 142 pounds.  VITAL SIGNS:  Blood pressure 170/90, heart rate 70 and regular,  respirations 16.  NECK:  No jugular venous distention; normal carotid upstrokes without  bruits.  HEENT:  Slight asymmetry to the face; EOMs full; normal lids and  conjunctivae; normal oral mucosa.  LUNGS:  Mild inspiratory and expiratory rhonchi.  CARDIAC:  Normal first and second heart sounds; modest early systolic  ejection murmur.  ABDOMEN:  Soft and nontender; normal bowel sounds; no organomegaly.  EXTREMITIES:  Normal distal pulses; no edema.  MUSCULOSKELETAL:  Tenderness over the anterior aspect of the humeral  head; some limitation of range of motion at the shoulder; neurologic  exam of the upper extremities is normal.   EKG:  Normal sinus rhythm; borderline left atrial abnormality; right  ventricular conduction delay; and occasional PVC.   Most recent laboratory available to me is from May when CBC and  chemistry profiles were normal.  She had tests drawn yesterday, but the  results are not currently available.   IMPRESSION:  Ms. Devins has episodes that are certainly consistent with  paroxysmal supraventricular tachycardia or perhaps atrial fibrillation.  A serious arrhythmia,  such as ventricular tachycardia, is much less  likely.  We will verify that thyroid function has been ascertained.  We  will proceed with an event recorder in an attempt to document rhythm  during a symptomatic spell.  The chest and shoulder discomfort that she  describes is associated with numbness in the hand is most likely  musculoskeletal in etiology.   Blood pressure is elevated at this visit.  We have asked her to collect   additional values for review at her next appointment in 1 month, once  event recording has been completed.   Thanks so much for sending this nice woman to see me.    Sincerely,      Gerrit Friends. Dietrich Pates, MD, [facility]  Electronically Signed    RMR/MedQ  DD: 12/07/2007  DT: 12/08/2007  Job #: [id number]

## 2010-08-26 NOTE — Assessment & Plan Note (Signed)
NAME:  [name], [name]             CHART#:  [id number]   DATE:  09/29/2007                       DOB:  1955/04/16   CHIEF COMPLAINT:  Followup colonoscopy and EGD for abdominal bloating,  chronic diarrhea, chronic GERD, and epigastric pain.   PROBLEM LIST:  1. GERD with history of normal esophagus on 09/14/2007.  2. Chronic diarrhea felt to be due to IBS.  Colonoscopy on 09/14/2007,      by Dr. Jena Gauss showed benign biopsies and a full set of negative      stool studies.  3. History of adenomatous polyps, 2005, in [place].  4. Status post partial hysterectomy followed later by bilateral      salpingo-oophorectomy.  5. Status post cholecystectomy.  6. Status post 3 C-sections.  7. History of cervical disk surgery, chronic back pain, left rotator      cuff repair, carpal tunnel, and multiple surgeries.  8. History of multiple incisional herniorrhaphies with mesh placement      by Dr. Lovell Sheehan.  9. History of arthritis.   SUBJECTIVE:  The patient is a 55 year old Caucasian female who underwent  EGD and colonoscopy by Dr. Jena Gauss for above problems.  She is felt to  have postcholecystectomy diarrhea versus IBS.  She was previously on  Questran, but tells me she could not stand the taste but will take it if  she needs to.  She has been avoiding fried and fatty foods.  Overall,  she is doing much better since the procedures.  She does continue to  have a couple of loose nonbloody stools per day.  Occasional  intermittent epigastric pain; however, most of her pain is chronic mid  to low back pain.  Her weight has remained stable.   CURRENT MEDICATIONS:  See the list from 09/29/2007.   ALLERGIES:  Darvocet.   OBJECTIVE:  VITAL SIGNS:  Weight 146 pounds, height 61 inches,  temperature 98.7, blood pressure 122/78, and pulse 80.  GENERAL:  The patient is a well-developed, well-nourished Caucasian  female in no acute distress.  HEENT:  Sclerae clear, nonicteric.  Conjunctivae pink.   Oropharynx pink  and moist without any lesions.  CHEST:  Heart regular rate and rhythm.  Normal S1 and S2.  ABDOMEN:  Positive bowel sounds x4.  No bruits auscultated.  Soft,  nontender, nondistended without palpable mass or hepatosplenomegaly.  No  rebound, tenderness, or guarding.  EXTREMITIES:  Without clubbing or edema.   ASSESSMENT:  The patient is a 51-hour-old Caucasian female with  postcholecystectomy diarrhea and irritable bowel syndrome.   PLAN:  1. Daily fiber supplement of choice.  We will send her home with      Benefiber samples today.  2. Levsin 0.125 mg before meals and nightly p.r.n. abdominal cramping      and diarrhea, #60 with 2 refills.  3. Can continue Questran 2-4 g daily, not within 2 hours of any of her      other medications, 30 packets with 2 refills.  4. Office visit in 6 months with Dr. Jena Gauss or sooner if needed.       Lorenza Burton, N.P.  Electronically Signed     R. Roetta Sessions, M.D.  Electronically Signed    KJ/MEDQ  D:  09/29/2007  T:  09/30/2007  Job:  [id number]   cc:   [name],  M.D. 

## 2010-08-26 NOTE — Letter (Signed)
February 02, 2008    Kaitlyn Duel, MD  [address], [address]  [place], [place] [address]   RE:  [name], [name]  MRN:  [id number]  /  DOB:  January 15, 1956   [name]:   [name] returns to the office for continued assessment and  treatment of palpitations without demonstrable arrhythmia by event  recording.  She has continued to have symptoms, which have created  substantial anxiety.  Use of Xanax either is 0.25 mg t.i.d. or 0.5 mg  t.i.d., had no effect.  She has not experienced lightheadedness nor  syncope.  She has no chest pain nor dyspnea.   CURRENT MEDICATIONS:  1. Azitamide 10 mg daily.  2. Xanax 0.25-0.5 mg t.i.d. p.r.n.  3. Chlorthalidone 12.5 mg daily.   She has carefully monitored her blood pressures at home.  Of 70 or 80  readings, the vast majority showed systolics below 135.  All diastolic  pressures are below 80.   PHYSICAL EXAMINATION:  GENERAL:  Anxious woman, in no acute distress.  VITAL SIGNS:  The weight is 149, 4 pounds more than at her last visit.  Blood pressure 130/80, heart rate 70 and regular, and respirations 14.  NECK:  No jugular venous distention; no carotid bruits.  LUNGS:  Clear.  CARDIAC:  Normal first and second heart sounds; fourth heart sound  present.  ABDOMEN:  Soft and nontender; no organomegaly.  EXTREMITIES:  No edema.   IMPRESSION:  [name] is doing well overall.  There is no evidence for  significant heart disease.  To decrease her symptoms, I will ask her to  take Toprol 25 mg daily.  She will continue on chlorthalidone, which is  working well for her hypertension.  A chemistry profile is pending.  I  will reassess this nice woman in 1 month.    Sincerely,      Gerrit Friends. Dietrich Pates, MD, [facility]  Electronically Signed    RMR/MedQ  DD: 02/02/2008  DT: 02/03/2008  Job #: [id number]

## 2010-08-26 NOTE — Procedures (Signed)
DUPLEX DEEP VENOUS EXAM - LOWER EXTREMITY   INDICATION:  Severe varicose veins with pain.   HISTORY:  Edema:  No.  Trauma/Surgery:  No.  Pain:  Bilateral upper lateral thigh pain with exercise and alleviated  with rest.  PE:  No.  Previous DVT:  No.  Anticoagulants:  Other:  The patient states no raised varicose veins bilaterally.   DUPLEX EXAM:                CFV   SFV   PopV  PTV    GSV                R  L  R  L  R  L  R   L  R  L  Thrombosis    o  o  o  o  o  o  o   o  o  o  Spontaneous   +  +  +  +  +  +  +   +  +  +  Phasic        +  +  +  +  +  +  +   +  +  +  Augmentation  +  +  +  +  +  +  +   +  +  +  Compressible  +  +  +  +  +  +  +   +  +  +  Competent     +  +  +  +  +  +  +   +  +  +   Legend:  + - yes  o - no  p - partial  D - decreased   IMPRESSION:  1. No evidence of deep vein thrombosis noted in the bilateral lower      extremities.  2. No reflux is noted in the bilateral deep venous system or the      bilateral greater saphenous and lesser saphenous veins.    _____________________________  Quita Skye Hart [place], M.D.   CH/MEDQ  D:  12/03/2008  T:  12/03/2008  Job:  [id number]

## 2010-08-26 NOTE — Letter (Signed)
January 09, 2008    [name], M.D.  [address], [address]  [place], [place] [address]   RE:  HADEN, CAVENAUGH  MRN:  [id number]  /  DOB:  10-May-1955   Dear Loraine Leriche:   Ms. Oconnor returns to the office for continued assessment and treatment  of palpitations.  She did not record events with her device as I  advised.  She claims that the nurse monitoring her rhythms told her that  was not necessary.  She also had problems with the skin patches and was  only able to carry the device for 10 days.  During that interval, no  significant arrhythmias were identified.  Her heart rate tended to stay  well within the normal range.  She did have a few PACs and PVCs, but  without information as to her symptoms, it is impossible to determine if  these were symptomatic.  She notes continuing fatigue.  She does not  report any more right shoulder, chest, and arm discomfort.  She did not  monitor blood pressures at home as I asked.   Her exam and medications are unchanged.  Her heart rate was initially 52  when she entered the office, but subsequently was closer to 70.  Blood  pressure is 150/85.   IMPRESSION:  Ms. Vita does not have any significant arrhythmias.  We  do not have a specific etiology for her symptoms.  I suggested that we  could either try a low dose beta-blocker  to suppress premature beats or an antianxiety agent.  We settled on  Xanax 0.25 mg t.i.d. with dose to be increased to 0.5 mg t.i.d. if she  continues to have symptoms.  She will start chlorthalidone 12.5 mg daily  for her mild hypertension.  I will reassess this nice woman again in 3  weeks at which time a metabolic profile will be obtained.    Sincerely,      Gerrit Friends. Dietrich Pates, MD, [facility]  Electronically Signed    RMR/MedQ  DD: 01/09/2008  DT: 01/10/2008  Job #: [id number]

## 2010-08-26 NOTE — Letter (Signed)
May 01, 2008    [name], MD  [address]  [place], [place] [address]   RE:  [name], [name]  MRN:  [id number]  /  DOB:  Jun 26, 1955   Dear Loraine Leriche,   Ms. Hackert-Chester returns to the office after a delay caused by the  illness of her husband.  Unfortunately, he required below-the-knee  amputation and soon will require an above-the-knee amputation on the  same side.  Caring for him has been a mental and physical strain.  She  has increased low back pain.  She has monitored blood pressure at home,  which has been good.  She rarely has diastolics above 90 or systolics  approaching 150.  The palpitations resolved immediately upon starting  metoprolol XL 25 mg daily.   Her other medications include:  1. Acetamide 10 mg daily.  2. Chlorthalidone 12.5 mg daily.  3. Simvastatin 80 mg nightly.   PHYSICAL EXAMINATION:  GENERAL:  A pleasant woman in no acute distress.  VITAL SIGNS:  The weight is 147 pounds, 2 pounds less than in October.  Blood pressure 135/75, heart rate 65 and regular, respirations 12 and  unlabored.  NECK:  No jugular venous distention; no carotid bruits.  LUNGS:  Clear.  CARDIAC:  Normal first heart sounds; increased intensity of the aortic  component of the second heart sound.  ABDOMEN:  Soft and nontender; no organomegaly.  EXTREMITIES:  No edema; normal distal pulses.   IMPRESSION:  [name] is doing well with current therapy.  Blood  pressure control is adequate.  I will plan to reassess this nice woman  in 1 year.  Please call me in the interim, if I can offer assistance in  her medical care.    Sincerely,      Gerrit Friends. Dietrich Pates, MD, [facility]  Electronically Signed    RMR/MedQ  DD: 05/01/2008  DT: 05/02/2008  Job #: [id number]

## 2010-08-26 NOTE — Op Note (Signed)
NAME:  [name], [name]            ACCOUNT NO.:  [id number]   MEDICAL RECORD NO.:  [id number]          PATIENT TYPE:  AMB   LOCATION:  DAY                           FACILITY:  [facility]   PHYSICIAN:  R. Roetta Sessions, M.D. DATE OF BIRTH:  [birth year]   DATE OF PROCEDURE:  09/14/2007  DATE OF DISCHARGE:                               OPERATIVE REPORT   EGD and ileocolonoscopy with biopsy and stool sampling.   INDICATIONS FOR PROCEDURE:  Epigastric right upper quadrant abdominal  pain, status post cholecystectomy, negative CT scan, recent history of  abdominal hernia repair, history of colonic polyps.  She has had  diarrhea for 5 months.  EGD and colonoscopy are now being done as she  has a distant history of polyps with adenomas removed poorly in [place]  4 years ago.  Risks, benefits, alternatives, and limitations have been  reviewed, questions answered, and all parties agreeable.  Please see  documentation in medical record.   PROCEDURE NOTE:  O2 saturation, blood pressure, pulse, and respirations  were monitored through the entirety of both procedures.   CONSCIOUS SEDATION:  Versed 5 mg IV, Demerol 125 mg IV in divided doses.  Cetacaine spray for topical pharyngeal anesthesia.   INSTRUMENT:  Pentax video chip system.   FINDINGS:  EGD examination of tubular esophagus revealed normal mucosa.  EG junction easily traversed.   Stomach:  Gastric cavity was emptied and insufflated well with air.  Thorough examination of the gastric mucosa including retroflexion of the  proximal stomach and esophagogastric junction demonstrated no  abnormalities.  Pylorus was patent, easily traversed.  Examination of  the bulb and second portion revealed no abnormalities.   Therapeutic/diagnostic maneuvers performed:  None.   The patient tolerated the procedure well and was prepared for  colonoscopy.  A digital rectal exam revealed no abnormalities.   ENDOSCOPIC FINDINGS:  The prep was suboptimal but  adequate.  Colon:  Colonic mucosa was surveyed from the rectosigmoid junction through the  left transverse, right colon to the appendiceal orifice, ileocecal  valve, and cecum.  These structures were well seen and photographed for  the record.  Terminal ileum was intubated at 5 cm.  From this level, the  scope was slowly and cautiously withdrawn.  All previously mentioned  mucosal surfaces were again seen.  The patient had scattered sigmoid  diverticula.  The remainder of colonic mucosa and terminal ileal mucosa  appeared entirely normal.  Scope was pulled down the rectum where a  thorough examination of the rectal mucosa including retroflexion of the  anal verge demonstrated 3 distal diminutive polyps.  Stool samples were  sent to the microbiology lab.  Biopsies of the descending and sigmoid  were taken to rule out microscopic colitis.  The 3 polyps in the distal  rectum were removed with cold biopsy forceps in the retroflexed  position.  The patient did have single prominent anal papilla.  The  patient tolerated both procedures well and was reactive to endoscopy.   IMPRESSION:  Normal esophagus, stomach, D1-D2.  Colonoscopy findings:  Distal diminutive rectal polyp, status post cold biopsy removal.  Otherwise, normal rectum.  Scattered sigmoid diverticula.  Remainder of  the colonic mucosa and terminal ileal mucosa appeared normal.  Status  post segmental biopsies and stool collection of the colon as described  above.   RECOMMENDATIONS:  1. Followup on path.  2. Diverticulosis literature provided to [name].  3. Further recommendations to follow.      Jonathon Bellows, M.D.  Electronically Signed     RMR/MEDQ  D:  09/14/2007  T:  09/15/2007  Job:  [id number]   cc:   Rhae Lerner. Margretta Ditty, M.D.  [address]  [place]  [place] [address]   [facility]
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
## 2010-08-29 NOTE — H&P (Signed)
NAME:  [name], [name]            ACCOUNT NO.:  [id number]   MEDICAL RECORD NO.:  [id number]          PATIENT TYPE:  AMB   LOCATION:  DAY                           FACILITY:  [facility]   PHYSICIAN:  Denny Peon. Ulice Brilliant, D.P.M.  DATE OF BIRTH:  06-17-1955   DATE OF ADMISSION:  DATE OF DISCHARGE:  LH                                HISTORY & PHYSICAL   HISTORY OF PRESENT ILLNESS:  Bunion pain, right foot, present for about five  months.  The patient relates she is at the point now where no shoes are  comfortable.  She relates it just aches and throbs in this area.  Ms.  [name] is a recent transplant from [place].   PAST MEDICAL HISTORY:  Significant for hypercholesterolemia for which she  has taken, in the past, Vytorin.  She also takes Ambien for difficulty  sleeping.  She has had previous back surgery in 1998, previous gallbladder  procedure last year, hernia repair in February of this year.   SOCIAL HISTORY:  She smokes 1/2 pack per day of cigarettes.  She does not  drink alcohol.   ALLERGIES:  DARVOCET.   PHYSICAL EXAMINATION:  Patterson 55 year old white female who has a prominent  HAV deformity of the right foot.  She has been treated previously with anti-  inflammatory which gave her a little relief.  She is noted clinically to  have a well defined HAV deformity of the right foot.  It is painful to  palpation of the medial eminence.  Range of motion is somewhat  uncomfortable.  The length of the metatarsal is slightly shorter than the  first, equal to the third.   ASSESSMENT:  HAV deformity, right foot.   PLAN:  The patient did not do well with conservative care which basically  consisted of wearing sandals, soaks, and taking Diclofenac.  We have  discussed surgical repair.  This would consist of an Recruitment consultant  under [facility] at [facility].  She has been described on two occasions the  details of the procedure, the usual postoperative course, the possible  complications.  She  has read, apparently understood, and signed the consent  form.                                            ______________________________  Denny Peon. Ulice Brilliant, D.P.M.     CMD/MEDQ  D:  12/01/2005  T:  12/01/2005  Job:  [id number]

## 2010-08-29 NOTE — Op Note (Signed)
[name], [name]            ACCOUNT NO.:  [id number]   MEDICAL RECORD NO.:  [id number]          PATIENT TYPE:  AMB   LOCATION:  DAY                           FACILITY:  [facility]   PHYSICIAN:  Dalia Heading, M.D.  DATE OF BIRTH:  [birth year]   DATE OF PROCEDURE:  07/19/2006  DATE OF DISCHARGE:                               OPERATIVE REPORT   PREOPERATIVE DIAGNOSIS:  Recurrent incisional hernia.   POSTOPERATIVE DIAGNOSIS:  Recurrent incisional hernia.   PROCEDURE:  Recurrent incisional herniorrhaphy with mesh.   SURGEON:  Dr. Franky Macho.   ANESTHESIA:  General endotracheal.   INDICATIONS:  The patient is a 55 year old white female who has had  multiple abdominal surgeries with incisional herniorrhaphies in the past  who now presents with a recurrent incisional hernia along the lower  aspect of her incision.  The risks and benefits of the procedure,  including bleeding, infection, intestinal injury, and the possibility of  recurrence of the hernia, were fully explained to the patient who gave  informed consent.   PROCEDURE NOTE:  The patient was placed in the supine position.  After  induction of general endotracheal anesthesia, the abdomen was prepped  and draped using the usual sterile technique with Betadine.  Surgical  site confirmation was performed.   A lower midline incision was made through the previous midline surgical  scar.  The surgical scar was excised.  The dissection was taken down to  the fascia/mesh.  It appeared the patient had previous dual mesh placed  in the past.  There was a hernia defect along the inferior aspect of the  mesh.  This extended into the right groin region.  The defect was  approximately 4-5 cm at its greatest diameter.  The perineal layer was  freed away from the overlying muscle and closed using 0-Ethibond  interrupted sutures.  An onlay polypropylene mesh patch was then placed  over this area.  The fascia and remaining mesh was  reapproximated  transversely using 0-Prolene interrupted sutures.  The subcutaneous  layer was reapproximated using 2-0 and 3-0 Vicryl interrupted sutures.  The skin was closed using staples.  0.5 cm Sensorcaine was instilled in  the surrounding wound.  Betadine ointment and dry sterile dressing were  applied.   All team needle counts were correct and the procedure.  The patient was  extubated in the operating room and went back to the recovery room awake  in stable condition.   COMPLICATIONS:  None.   SPECIMEN:  None.   BLOOD LOSS:  Minimal.      Dalia Heading, M.D.  Electronically Signed     MAJ/MEDQ  D:  07/19/2006  T:  07/19/2006  Job:  [id number]   cc:   Corrie Mckusick, M.D.  Fax: [phone]

## 2010-08-29 NOTE — Op Note (Signed)
NAME:  [name], [name]            ACCOUNT NO.:  [id number]   MEDICAL RECORD NO.:  [id number]          PATIENT TYPE:  AMB   LOCATION:  DAY                           FACILITY:  [facility]   PHYSICIAN:  Denny Peon. Ulice Brilliant, D.P.M.  DATE OF BIRTH:  1955-12-06   DATE OF PROCEDURE:  12/02/2005  DATE OF DISCHARGE:                                 OPERATIVE REPORT   PREOPERATIVE DIAGNOSIS:  Hallux abductovalgus deformity.   POSTOPERATIVE DIAGNOSIS:  Hallux abductovalgus deformity.   OPERATION/PROCEDURE:  Austin bunionectomy, right foot.   SURGEON:  Denny Peon. Ulice Brilliant, D.P.M.   ANESTHESIA:  MAC.   INDICATIONS:  Painful bunion deformity of right foot getting progressively  worse. This has been bothersome for probably a year but to the point now in  the last three months she has been unable to wear regular shoes.   DESCRIPTION OF PROCEDURE:  [name] is brought to the OR and placed on  the table in the supine position.  IV sedation is established.  A Mayo block  was performed by about the first MTP of her right foot.  A pneumatic ankle  tourniquet was then applied across the right ankle.  The foot is prepped and  draped in the usual aseptic fashion.  An Ace bandage utilizing to  exsanguinate her foot.  Tourniquet was inflated to 250 mmHg.   Austin bunionectomy, right foot.  Attention is directed to this first MTP of  the right foot. A 7 cm, slightly curvilinear skin incision is created  utilizing a #15 blade and then deepened through subcutaneous tissue  utilizing sharp and blunt dissection.  Care was taken to avoid neurovascular  structures.  Care was taken to get good exposure of the medial aspect of the  joint capsule and inverted L capsulotomy was then performed.  The capsule  and periosteal tissues were reflected away from the underlying and bony  surface.  The medial lamina was delivered in the surgical wound and excised.  The first interspace was then entered through the original skin  incision.  A  Weitlaner self-retaining retractor was utilized to aid in exposure.  The  dissection was carried down to the level of the fibular sesamoid.  An  adductor halluces tenotomy was then performed.  Attention was then directed  back to the first metatarsal head.  A 0.045 K-wire was introduced and  utilized to act as a pin axis guide.  The [place] cuts were then made with  the #63 blade on the oscillating saw.  The K-wire was removed and the  capital fragment is relocated approximately 30% across the first  metatarsal's width. This was then fixated via two 0.045 K-wires driven in a  slightly divergent fashion with care taken to avoid any redundant protrusion  of K-wire through the articular surface.  The toe is put through a range of  motion and found to be freely movable.  The osteotomy was deemed stable.  The __________ scan shows good position of the capital fragment along with  no protrusion of the K-wires through the bone or cartilage.   The wound is flushed with  copious amounts of irrigant.  Redundant bone  laterally is excised utilizing the #63 blade.  The newly formed ostial  surface is rasped smooth,  K-wires were bent close to the metatarsal surface  cut and rotated so that it lay flush.   Joint capsule was then reapproximated  and approximately 1-2 mm wedge of  redundant medial joint capsule was excised.  Capsule tissues were then  reapproximated and closed utilizing 3-0 Vicryl.  Subcutaneous tissues were  reapproximated and closed utilizing 4-0 Vicryl in a running horizontal  mattress suture.  Skin was closed with 4-0 Vicryl in a running subcuticular  suture.  Steri-Strips were applied across the wound.  The postoperative  injection of Marcaine and Hexadrol was dispensed.  Betadine-soaked Adaptic  dressing and dry sterile compressive dressing follows.  The tourniquet is  deflated with tourniquet time spanning roughly 55 minutes.   [name] tolerates the anesthesia  and procedure well.  She is  transported from the OR to day hospital.  While there a list of written  instructions are explained to her and her husband.  A prescription for  hydrocodone 10/650 and Phenergan 25 mg is dispensed.  She is adamantly  instructed to stay off the foot, keep it elevated and keep ice to it.  She  is to wear the Cam walker when she has to get up and ambulate.  She asked  for a pair of crutches which I am willing to do as long as she is careful.  We will see her in one week for her first postoperative visit.           ______________________________  Denny Peon. Ulice Brilliant, D.P.M.     CMD/MEDQ  D:  12/02/2005  T:  12/03/2005  Job:  [id number]

## 2010-08-29 NOTE — H&P (Signed)
[name], [name]            ACCOUNT NO.:  [id number]   MEDICAL RECORD NO.:  [id number]          PATIENT TYPE:  AMB   LOCATION:                                [facility]   PHYSICIAN:  Dalia Heading, M.D.  DATE OF BIRTH:  07-13-1955   DATE OF ADMISSION:  07/19/2006  DATE OF DISCHARGE:  LH                              HISTORY & PHYSICAL   CHIEF COMPLAINT:  Recurrent incisional hernia.   HISTORY OF PRESENT ILLNESS:  The patient is a 55 year old white female  who is referred for evaluation and treatment of a recurrent incisional  hernia.  It has been present since 2007.  She has had multiple lower  abdominal surgeries and herniorrhaphies with mesh in [place] in the  past.  She has been having some pain and swelling along the right lower  quadrant inguinal region.  No nausea or vomiting have been noted.   PAST MEDICAL HISTORY:  1. As noted above.  2. Arthritis.   PAST SURGICAL HISTORY:  1. Multiple extremity surgeries.  2. Hysterectomy.  3. Incisional herniorrhaphies.   CURRENT MEDICATIONS:  1. Vytorin.  2. Cyclobenzaprine.  3. Naprosyn.  4. Citalopram.   ALLERGIES:  No known drug allergies.   REVIEW OF SYSTEMS:  The patient smokes half a pack of cigarettes a day.  She denies any significant alcohol use.  She denies any other  cardiopulmonary difficulties or bleeding disorders.   PHYSICAL EXAMINATION:  GENERAL:  The patient is a well-developed, well-  nourished white female in no acute distress.  LUNGS:  Clear to auscultation with equal breath sounds bilaterally.  HEART:  Regular rate and rhythm without S3, S4, or murmurs.  ABDOMEN:  Soft and nondistended.  She is tender in the right inguinal  region and right lower quadrant to palpation over the hernia site.  The  lower midline incision is present with no fascia palpable.  No  hepatosplenomegaly or masses are noted.   IMPRESSION:  Recurrent incisional hernia.   PLAN:  The patient is scheduled for a  recurrent incisional herniorrhaphy  with allograft mesh on July 19, 2006.  The risks and benefits of the  procedure including bleeding, infection, bowel injury and recurrence of  the hernia were fully explained to the patient who gave informed  consent.      Dalia Heading, M.D.  Electronically Signed     MAJ/MEDQ  D:  06/29/2006  T:  06/30/2006  Job:  [id number]   cc:   Corrie Mckusick, M.D.  Fax: [phone]

## 2010-11-07 HISTORY — PX: ABDOMINAL SURGERY: SHX537

## 2011-01-07 LAB — URINE MICROSCOPIC-ADD ON

## 2011-01-07 LAB — COMPREHENSIVE METABOLIC PANEL
ALT: 24
AST: 23
Alkaline Phosphatase: 90
CO2: 27
Calcium: 9.5
GFR calc Af Amer: >60
GFR calc non Af Amer: >60
Glucose, Bld: 98
Potassium: 3.9
Sodium: 137
Total Protein: 7

## 2011-01-07 LAB — URINALYSIS, ROUTINE W REFLEX MICROSCOPIC
Bilirubin Urine: NEGATIVE
Glucose, UA: NEGATIVE
Ketones, ur: NEGATIVE
Specific Gravity, Urine: <1.005 — ABNORMAL LOW
pH: 6

## 2011-01-07 LAB — DIFFERENTIAL
Basophils Relative: 0
Eosinophils Absolute: 0.1
Eosinophils Relative: 1
Lymphs Abs: 1.6
Monocytes Relative: 7
Neutrophils Relative %: 62

## 2011-01-07 LAB — CBC
Hemoglobin: 14.7
RBC: 4.69

## 2011-01-08 LAB — STOOL CULTURE

## 2011-01-08 LAB — GIARDIA/CRYPTOSPORIDIUM SCREEN(EIA)
Cryptosporidium Screen (EIA): NEGATIVE
Giardia Screen - EIA: NEGATIVE

## 2011-01-08 LAB — OVA AND PARASITE EXAMINATION

## 2011-01-08 LAB — CLOSTRIDIUM DIFFICILE EIA

## 2011-01-08 LAB — FECAL LACTOFERRIN, QUANT

## 2011-01-12 LAB — BLOOD GAS, ARTERIAL
Acid-base deficit: 0.9
FIO2: 0.21
O2 Saturation: 96.8
Patient temperature: 37
TCO2: 19.7
pCO2 arterial: 36.4

## 2011-01-19 LAB — BASIC METABOLIC PANEL
CO2: 27
Calcium: 9
Chloride: 101
Creatinine, Ser: 0.75
GFR calc Af Amer: >60
Glucose, Bld: 100 — ABNORMAL HIGH

## 2011-01-19 LAB — DIFFERENTIAL
Basophils Absolute: 0
Basophils Relative: 0
Eosinophils Absolute: 0.1 — ABNORMAL LOW
Monocytes Absolute: 0.4
Monocytes Relative: 7
Neutrophils Relative %: 62

## 2011-01-19 LAB — CBC
MCHC: 34
MCV: 90.2
RBC: 4.54
RDW: 13.7

## 2011-03-30 ENCOUNTER — Encounter: Payer: Self-pay | Admitting: Cardiology

## 2011-04-30 ENCOUNTER — Other Ambulatory Visit (HOSPITAL_COMMUNITY): Payer: Self-pay | Admitting: Internal Medicine

## 2011-04-30 DIAGNOSIS — Z139 Encounter for screening, unspecified: Secondary | ICD-10-CM

## 2011-05-05 ENCOUNTER — Ambulatory Visit (HOSPITAL_COMMUNITY)
Admission: RE | Admit: 2011-05-05 | Discharge: 2011-05-05 | Disposition: A | Discharge status: Home / Self Care | Payer: Medicare Other | Source: Ambulatory Visit | Attending: Internal Medicine | Admitting: Internal Medicine

## 2011-05-05 DIAGNOSIS — Z1231 Encounter for screening mammogram for malignant neoplasm of breast: Secondary | ICD-10-CM | POA: Insufficient documentation

## 2011-05-05 DIAGNOSIS — Z139 Encounter for screening, unspecified: Secondary | ICD-10-CM

## 2011-05-27 ENCOUNTER — Encounter: Payer: Self-pay | Admitting: Internal Medicine

## 2011-05-28 ENCOUNTER — Ambulatory Visit (INDEPENDENT_AMBULATORY_CARE_PROVIDER_SITE_OTHER): Payer: Medicare Other | Admitting: Gastroenterology

## 2011-05-28 ENCOUNTER — Encounter: Payer: Self-pay | Admitting: Gastroenterology

## 2011-05-28 VITALS — BP 135/85 | HR 92 | Temp 97.8°F | Ht 61.0 in | Wt 155.4 lb

## 2011-05-28 DIAGNOSIS — K760 Fatty (change of) liver, not elsewhere classified: Secondary | ICD-10-CM

## 2011-05-28 DIAGNOSIS — K589 Irritable bowel syndrome without diarrhea: Secondary | ICD-10-CM

## 2011-05-28 DIAGNOSIS — K7689 Other specified diseases of liver: Secondary | ICD-10-CM

## 2011-05-28 MED ORDER — RESTORA PO CAPS: ORAL_CAPSULE | ORAL | Status: DC

## 2011-05-28 NOTE — Assessment & Plan Note (Signed)
NL HFP 2011 AND APR 2012.  LOSE 10-20 LBS. FOLLOW A LOW FAT DIET.
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
## 2011-05-28 NOTE — Patient Instructions (Addendum)
ADD RESTORA EVERY MORNING. ALTERNATIVE PROBIOTICS ARE [facility] OR [facility] COLON HE[facility]. FOLLOW A LOW FAT/DAIRY FREE DIET. ADD 2 TUMS WITH MEALS THREE TIMES A DAY. FOLLOW UP IN 2 MOS.  Irritable Bowel Syndrome (Spastic Colon) Irritable Bowel Syndrome (IBS) is caused by a disturbance of normal bowel function. Other terms used are spastic colon, mucous colitis, and irritable colon. It does not require surgery, nor does it lead to cancer. There is no cure for IBS. But with proper diet, stress reduction, and medication, you will find that your problems (symptoms) will gradually disappear or improve. IBS is a common digestive disorder. It usually appears in late adolescence or early adulthood. Women develop it twice as often as men.  CAUSES After food has been digested and absorbed in the small intestine, waste material is moved into the colon (large intestine). In the colon, water and salts are absorbed from the undigested products coming from the small intestine. The remaining residue, or fecal material, is held for elimination. Under normal circumstances, gentle, rhythmic contractions on the bowel walls push the fecal material along the colon towards the rectum. In IBS, however, these contractions are irregular and poorly coordinated. The fecal material is either retained too long, resulting in constipation, or expelled too soon, producing diarrhea.  SYMPTOMS  The most common symptom of IBS is pain. It is typically in the lower left side of the belly (abdomen). But it may occur anywhere in the abdomen. It can be felt as heartburn, backache, or even as a dull pain in the arms or shoulders. The pain comes from excessive bowel-muscle spasms and from the buildup of gas and fecal material in the colon. This pain:  Can range from sharp belly (abdominal) cramps to a dull, continuous ache.   Usually worsens soon after eating.   Is typically relieved by having a bowel movement or passing gas.    Abdominal pain is usually accompanied by constipation. But it may also produce diarrhea. The diarrhea typically occurs right after a meal or upon arising in the morning. The stools are typically soft and watery. They are often flecked with secretions (mucus).  Other symptoms of IBS include:  Bloating.  Loss of appetite.   Heartburn.  Feeling sick to your stomach  (nausea).   Belching  Vomiting   Gas.   IBS may also cause a number of symptoms that are unrelated to the digestive system:  Fatigue.  Headaches.   Anxiety  Shortness of breath   Difficulty in concentrating.  Dizziness.   These symptoms tend to come and go.  HOME CARE INSTRUCTIONS   Avoid foods that are high in fat or oils. Some examples WUJ:[id number] cream, butter, frankfurters, sausage, and other fatty meats.   Avoid foods that have a laxative effect, such as fruit, fruit juice, and dairy products.   Cut out carbonated drinks, chewing gum, and "gassy" foods, such as beans and cabbage. This may help relieve bloating and belching.   Keep track of what foods seem to trigger your symptoms.   Avoid emotionally charged situations or circumstances that produce anxiety.   Start or continue exercising.   Get plenty of rest and sleep.  Low-Fat Diet BREADS, CEREALS, PASTA, RICE, DRIED PEAS, AND BEANS These products are high in carbohydrates and most are low in fat. Therefore, they can be increased in the diet as substitutes for fatty foods. They too, however, contain calories and should not be eaten in excess. Cereals can be eaten for snacks as well  as for breakfast.  Include foods that contain fiber (fruits, vegetables, whole grains, and legumes). Research shows that fiber may lower blood cholesterol levels, especially the water-soluble fiber found in fruits, vegetables, oat products, and legumes. FRUITS AND VEGETABLES It is good to eat fruits and vegetables. Besides being sources of fiber, both are rich in vitamins  and some minerals. They help you get the daily allowances of these nutrients. Fruits and vegetables can be used for snacks and desserts. MEATS Limit lean meat, chicken, [place], and fish to no more than 6 ounces per day. Beef, Pork, and Lamb Use lean cuts of beef, pork, and lamb. Lean cuts include:  Extra-lean ground beef.  Arm roast.  Sirloin tip.  Center-cut ham.  Round steak.  Loin chops.  Rump roast.  Tenderloin.  Trim all fat off the outside of meats before cooking. It is not necessary to severely decrease the intake of red meat, but lean choices should be made. Lean meat is rich in protein and contains a highly absorbable form of iron. Premenopausal women, in particular, should avoid reducing lean red meat because this could increase the risk for low red blood cells (iron-deficiency anemia).  Chicken and [place] These are good sources of protein. The fat of poultry can be reduced by removing the skin and underlying fat layers before cooking. Chicken and [place] can be substituted for lean red meat in the diet. Poultry should not be fried or covered with high-fat sauces. Fish and Shellfish Fish is a good source of protein. Shellfish contain cholesterol, but they usually are low in saturated fatty acids. The preparation of fish is important. Like chicken and [place], they should not be fried or covered with high-fat sauces. EGGS Egg whites contain no fat or cholesterol. They can be eaten often. Try 1 to 2 egg whites instead of whole eggs in recipes or use egg substitutes that do not contain yolk.  MILK AND DAIRY PRODUCTS Use skim or 1% milk instead of 2% or whole milk. Decrease whole milk, natural, and processed cheeses. Use nonfat or low-fat (2%) cottage cheese or low-fat cheeses made from vegetable oils. Choose nonfat or low-fat (1 to 2%) yogurt. Experiment with evaporated skim milk in recipes that call for heavy cream. Substitute low-fat yogurt or low-fat cottage cheese for sour cream in  dips and salad dressings. Have at least 2 servings of low-fat dairy products, such as 2 glasses of skim (or 1%) milk each day to help get your daily calcium intake.  FATS AND OILS Butterfat, lard, and beef fats are high in saturated fat and cholesterol. These should be avoided.Vegetable fats do not contain cholesterol. AVOID coconut oil, palm oil, and palm kernel oil, WHICH are very high in saturated fats. These should be limited. These fats are often used in bakery goods, processed foods, popcorn, oils, and nondairy creamers. Vegetable shortenings and some peanut butters contain hydrogenated oils, which are also saturated fats. Read the labels on these foods and check for saturated vegetable oils.  Desirable liquid vegetable oils are corn oil, cottonseed oil, olive oil, canola oil, safflower oil, soybean oil, and sunflower oil. Peanut oil is not as good, but small amounts are acceptable. Buy a heart-healthy tub margarine that has no partially hydrogenated oils in the ingredients. AVOID Mayonnaise and salad dressings often are made from unsaturated fats.  OTHER EATING TIPS Snacks  Most sweets should be limited as snacks. They tend to be rich in calories and fats, and their caloric content outweighs their nutritional  value. Some good choices in snacks are graham crackers, melba toast, soda crackers, bagels (no egg), English muffins, fruits, and vegetables. These snacks are preferable to snack crackers, [place] fries, and chips. Popcorn should be air-popped or cooked in small amounts of liquid vegetable oil.  Desserts Eat fruit, low-fat yogurt, and fruit ices instead of pastries, cake, and cookies. Sherbet, angel food cake, gelatin dessert, frozen low-fat yogurt, or other frozen products that do not contain saturated fat (pure fruit juice bars, frozen ice pops) are also acceptable.   COOKING METHODS Choose those methods that use little or no fat. They include: Poaching.  Braising.  Steaming.   Grilling.  Baking.  Stir-frying.  Broiling.  Microwaving.  Foods can be cooked in a nonstick pan without added fat, or use a nonfat cooking spray in regular cookware. Limit fried foods and avoid frying in saturated fat. Add moisture to lean meats by using water, broth, cooking wines, and other nonfat or low-fat sauces along with the cooking methods mentioned above. Soups and stews should be chilled after cooking. The fat that forms on top after a few hours in the refrigerator should be skimmed off. When preparing meals, avoid using excess salt. Salt can contribute to raising blood pressure in some people.  EATING AWAY FROM HOME Order entres, potatoes, and vegetables without sauces or butter. When meat exceeds the size of a deck of cards (3 to 4 ounces), the rest can be taken home for another meal. Choose vegetable or fruit salads and ask for low-calorie salad dressings to be served on the side. Use dressings sparingly. Limit high-fat toppings, such as bacon, crumbled eggs, cheese, sunflower seeds, and olives. Ask for heart-healthy tub margarine instead of butter.   Lactose Free Diet Lactose is a carbohydrate that is found mainly in milk and milk products, as well as in foods with added milk or whey. Lactose must be digested by the enzyme in order to be used by the body. Lactose intolerance occurs when there is a shortage of lactase. When your body is not able to digest lactose, you may feel sick to your stomach (nausea), bloating, cramping, gas and diarrhea.  There are many dairy products that may be tolerated better than milk by some people:  The use of cultured dairy products such as yogurt, buttermilk, cottage cheese, and sweet acidophilus milk (Kefir) for lactase-deficient individuals is usually well tolerated. This is because the healthy bacteria help digest lactose.   Lactose-hydrolyzed milk (Lactaid) contains 40-90% less lactose than milk and may also be well tolerated.    SPECIAL  NOTES  Lactose is a carbohydrates. The major food source is dairy products. Reading food labels is important. Many products contain lactose even when they are not made from milk. Look for the following words: whey, milk solids, dry milk solids, nonfat dry milk powder. Typical sources of lactose other than dairy products include breads, candies, cold cuts, prepared and processed foods, and commercial sauces and gravies.   All foods must be prepared without milk, cream, or other dairy foods.   Soy milk and lactose-free supplements (LACTASE) may be used as an alternative to milk.   FOOD GROUP ALLOWED/RECOMMENDED AVOID/USE SPARINGLY  BREADS / STARCHES 4 servings or more* Breads and rolls made without milk. [place], [place], or [place] bread. Breads and rolls that contain milk. Prepared mixes such as muffins, biscuits, waffles, pancakes. Sweet rolls, donuts, [place] toast (if made with milk or lactose).  Crackers: Soda crackers, graham crackers. Any crackers prepared without lactose.  Zwieback crackers, corn curls, or any that contain lactose.  Cereals: Cooked or dry cereals prepared without lactose (read labels). Cooked or dry cereals prepared with lactose (read labels). Total, Cocoa Krispies. Special K.  Potatoes / Pasta / Rice: Any prepared without milk or lactose. Popcorn. Instant potatoes, frozen [place] fries, scalloped or au gratin potatoes.  VEGETABLES 2 servings or more Fresh, frozen, and canned vegetables. Creamed or breaded vegetables. Vegetables in a cheese sauce or with lactose-containing margarines.  FRUIT 2 servings or more All fresh, canned, or frozen fruits that are not processed with lactose. Any canned or frozen fruits processed with lactose.  MEAT & SUBSTITUTES 2 servings or more (4 to 6 oz. total per day) Plain beef, chicken, fish, [place], lamb, veal, pork, or ham. Kosher prepared meat products. Strained or junior meats that do not contain milk. Eggs, soy meat substitutes, nuts.  Scrambled eggs, omelets, and souffles that contain milk. Creamed or breaded meat, fish, or fowl. Sausage products such as wieners, liver sausage, or cold cuts that contain milk solids. Cheese, cottage cheese, or cheese spreads.  MILK None. (See "BEVERAGES" for milk substitutes. See "DESSERTS" for ice cream and frozen desserts.) Milk (whole, 2%, skim, or chocolate). Evaporated, powdered, or condensed milk; malted milk.  SOUPS & COMBINATION FOODS Bouillon, broth, vegetable soups, clear soups, consomms. Homemade soups made with allowed ingredients. Combination or prepared foods that do not contain milk or milk products (read labels). Cream soups, chowders, commercially prepared soups containing lactose. Macaroni and cheese, pizza. Combination or prepared foods that contain milk or milk products.  DESSERTS & SWEETS In moderation Water and fruit ices; gelatin; angel food cake. Homemade cookies, pies, or cakes made from allowed ingredients. Pudding (if made with water or a milk substitute). Lactose-free tofu desserts. Sugar, honey, corn syrup, jam, jelly; marmalade; molasses (beet sugar); Pure sugar candy; marshmallows. Ice cream, ice milk, sherbet, custard, pudding, frozen yogurt. Commercial cake and cookie mixes. Desserts that contain chocolate. Pie crust made with milk-containing margarine; reduced-calorie desserts made with a sugar substitute that contains lactose. Toffee, peppermint, butterscotch, chocolate, caramels.  FATS & OILS In moderation Butter (as tolerated; contains very small amounts of lactose). Margarines and dressings that do not contain milk, Vegetable oils, shortening, Miracle Whip, mayonnaise, nondairy cream & whipped toppings without lactose or milk solids added (examples: Coffee Rich, Carnation Coffeemate, Rich's Whipped Topping, PolyRich). Tomasa Blase. Margarines and salad dressings containing milk; cream, cream cheese; peanut butter with added milk solids, sour cream, chip dips, made with  sour cream.  BEVERAGES Carbonated drinks; tea; coffee and freeze-dried coffee; some instant coffees (check labels). Fruit drinks; fruit and vegetable juice; Rice or Soy milk. Ovaltine, hot chocolate. Some cocoas; some instant coffees; instant iced teas; powdered fruit drinks (read labels).   CONDIMENTS / MISCELLANEOUS Soy sauce, carob powder, olives, gravy made with water, baker's cocoa, pickles, pure seasonings and spices, wine, pure monosodium glutamate, catsup, mustard. Some chewing gums, chocolate, some cocoas. Certain antibiotics and vitamin / mineral preparations. Spice blends if they contain milk products. MSG extender. Artificial sweeteners that contain lactose such as Equal (Nutra-Sweet) and Sweet 'n Low. Some nondairy creamers (read labels).   SAMPLE MENU*  Breakfast   Orange Juice.  Banana.   Bran flakes.   Nondairy Creamer.  [place]ienna Bread (toasted).   Butter or milk-free margarine.   Coffee or tea.    Noon Meal   Chicken Breast.  Rice.   Green beans.   Butter or milk-free margarine.  Fresh melon.   Coffee  or tea.    Evening Meal   Roast Beef.  Baked potato.   Butter or milk-free margarine.   Broccoli.   Lettuce salad with vinegar and oil dressing.  [employer].   Coffee or tea.

## 2011-05-28 NOTE — Progress Notes (Signed)
Faxed to PCP

## 2011-06-01 NOTE — Progress Notes (Signed)
Pt is aware of OV on 4/16 at 0830 with [facility]

## 2011-07-27 ENCOUNTER — Encounter: Payer: Self-pay | Admitting: Internal Medicine

## 2011-07-28 ENCOUNTER — Ambulatory Visit: Payer: Medicare Other | Admitting: Gastroenterology

## 2011-07-29 ENCOUNTER — Other Ambulatory Visit (HOSPITAL_COMMUNITY): Payer: Self-pay | Admitting: Family Medicine

## 2011-07-29 ENCOUNTER — Ambulatory Visit (HOSPITAL_COMMUNITY)
Admission: RE | Admit: 2011-07-29 | Discharge: 2011-07-29 | Disposition: A | Discharge status: Home / Self Care | Payer: Medicare Other | Source: Ambulatory Visit | Attending: Family Medicine | Admitting: Family Medicine

## 2011-07-29 DIAGNOSIS — M546 Pain in thoracic spine: Secondary | ICD-10-CM | POA: Insufficient documentation

## 2011-07-29 DIAGNOSIS — R0789 Other chest pain: Secondary | ICD-10-CM | POA: Insufficient documentation

## 2012-02-08 ENCOUNTER — Other Ambulatory Visit (HOSPITAL_COMMUNITY): Payer: Self-pay | Admitting: Preventative Medicine

## 2012-02-08 ENCOUNTER — Ambulatory Visit (HOSPITAL_COMMUNITY)
Admission: RE | Admit: 2012-02-08 | Discharge: 2012-02-08 | Disposition: A | Discharge status: Home / Self Care | Payer: Medicare Other | Source: Ambulatory Visit | Attending: Preventative Medicine | Admitting: Preventative Medicine

## 2012-02-08 DIAGNOSIS — R1011 Right upper quadrant pain: Secondary | ICD-10-CM

## 2012-02-08 LAB — CBC
ALT: 16 U/L (ref 7–35)
Alkaline Phosphatase: 90 U/L
Bilirubin, Direct: 0.1 mg/dL (ref 0.01–0.4)
Hemoglobin: 14.2 g/dL (ref 12.0–16.0)
RBC: 4.64
Total Bilirubin: 0.3 mg/dL
Total Protein: 7.2 g/dL
WBC: 4.5

## 2012-02-09 ENCOUNTER — Ambulatory Visit (INDEPENDENT_AMBULATORY_CARE_PROVIDER_SITE_OTHER): Payer: Medicare Other | Admitting: Gastroenterology

## 2012-02-09 ENCOUNTER — Encounter: Payer: Self-pay | Admitting: Gastroenterology

## 2012-02-09 VITALS — BP 123/70 | HR 72 | Temp 97.3°F | Ht 62.0 in | Wt 151.4 lb

## 2012-02-09 DIAGNOSIS — R1011 Right upper quadrant pain: Secondary | ICD-10-CM

## 2012-02-09 LAB — LIPASE: Lipase: 47 U/L (ref 11–59)

## 2012-02-09 NOTE — Patient Instructions (Addendum)
We have set you up for an upper endoscopy with Dr. Darrick Penna.   We will call you with the results of your blood work.  Seek medical attention if you have worsening pain, nausea, vomiting, fever, or chills.
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
## 2012-02-10 ENCOUNTER — Encounter (HOSPITAL_COMMUNITY): Payer: Self-pay | Admitting: Pharmacy Technician

## 2012-02-10 NOTE — Progress Notes (Signed)
Quick Note:  Informed pt normal Lipase. Continue with EGD as planned.   Start Prilosec daily. Samples to be left at front, pt picking up 10/31 ______

## 2012-02-11 DIAGNOSIS — R1011 Right upper quadrant pain: Secondary | ICD-10-CM | POA: Insufficient documentation

## 2012-02-11 NOTE — Assessment & Plan Note (Addendum)
55 year old female with hx of chronic abdominal pain, referral to pain clinic in past but has not gone recently (unclear why), who now presents with upper abdominal and right upper quadrant pain (right greater than left). Notes worsening, stabbing, +nausea, worsened with twisting to right. Denies melena. Decreased appetite and early satiety noted. Currently not on a PPI. Last EGD in Aug 2011 normal. [place] from Oct 2013 with normal CBD at 3mm, fatty liver. LFTs normal. CBC normal. No lipase on file. +CARNETT'S SIGN on physical exam. Likely dealing with acute on chronic abdominal pain with several possible etiologies: unable to exclude gastritis, PUD, unlikely pancreatitis. Likely element of underlying chronic abdominal pain, ?adhesions, would benefit from referral again to Pain Management after GI evaluation.   Proceed with upper endoscopy in the near future with Dr. Darrick Penna. The risks, benefits, and alternatives have been discussed in detail with patient. They have stated understanding and desire to proceed.  Lipase

## 2012-02-15 ENCOUNTER — Encounter (HOSPITAL_COMMUNITY): Payer: Self-pay | Admitting: *Deleted

## 2012-02-15 ENCOUNTER — Ambulatory Visit (HOSPITAL_COMMUNITY)
Admission: RE | Admit: 2012-02-15 | Discharge: 2012-02-15 | Disposition: A | Discharge status: Home / Self Care | Payer: Medicare Other | Source: Ambulatory Visit | Attending: Gastroenterology | Admitting: Gastroenterology

## 2012-02-15 ENCOUNTER — Encounter (HOSPITAL_COMMUNITY)
Admission: RE | Disposition: A | Discharge status: Home / Self Care | Payer: Self-pay | Source: Ambulatory Visit | Attending: Gastroenterology

## 2012-02-15 DIAGNOSIS — I10 Essential (primary) hypertension: Secondary | ICD-10-CM | POA: Insufficient documentation

## 2012-02-15 DIAGNOSIS — K2970 Gastritis, unspecified, without bleeding: Secondary | ICD-10-CM

## 2012-02-15 DIAGNOSIS — R1011 Right upper quadrant pain: Secondary | ICD-10-CM

## 2012-02-15 DIAGNOSIS — R197 Diarrhea, unspecified: Secondary | ICD-10-CM | POA: Insufficient documentation

## 2012-02-15 DIAGNOSIS — K2990 Gastroduodenitis, unspecified, without bleeding: Secondary | ICD-10-CM

## 2012-02-15 DIAGNOSIS — K2940 Chronic atrophic gastritis without bleeding: Secondary | ICD-10-CM | POA: Insufficient documentation

## 2012-02-15 HISTORY — PX: ESOPHAGOGASTRODUODENOSCOPY: SHX5428

## 2012-02-15 SURGERY — EGD (ESOPHAGOGASTRODUODENOSCOPY)
Anesthesia: Moderate Sedation

## 2012-02-15 MED ORDER — MIDAZOLAM HCL 5 MG/5ML IJ SOLN
INTRAMUSCULAR | Status: DC | PRN
  Administered 2012-02-15: 1 mg via INTRAVENOUS
  Administered 2012-02-15 (×2): 2 mg via INTRAVENOUS

## 2012-02-15 MED ORDER — MEPERIDINE HCL 100 MG/ML IJ SOLN
INTRAMUSCULAR | Status: AC
  Filled 2012-02-15: qty 2

## 2012-02-15 MED ORDER — MIDAZOLAM HCL 5 MG/5ML IJ SOLN
INTRAMUSCULAR | Status: AC
  Filled 2012-02-15: qty 10

## 2012-02-15 MED ORDER — BUTAMBEN-TETRACAINE-BENZOCAINE 2-2-14 % EX AERO
INHALATION_SPRAY | CUTANEOUS | Status: DC | PRN
  Administered 2012-02-15: 2 via TOPICAL

## 2012-02-15 MED ORDER — MEPERIDINE HCL 100 MG/ML IJ SOLN
INTRAMUSCULAR | Status: DC | PRN
  Administered 2012-02-15 (×2): 50 mg via INTRAVENOUS

## 2012-02-15 MED ORDER — MINERAL OIL PO OIL
TOPICAL_OIL | ORAL | Status: AC
  Filled 2012-02-15: qty 30

## 2012-02-15 MED ORDER — SODIUM CHLORIDE 0.45 % IV SOLN
INTRAVENOUS | Status: DC
  Administered 2012-02-15: 13:00:00 via INTRAVENOUS

## 2012-02-15 MED ORDER — STERILE WATER FOR IRRIGATION IR SOLN
Status: DC | PRN
  Administered 2012-02-15: 14:00:00
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
## 2012-02-15 NOTE — Op Note (Signed)
[facility] [address] [place], [address]   ENDOSCOPY PROCEDURE REPORT  PATIENT: Janey, Petron  MR#: #[id number] BIRTHDATE: 07/01/1955 , 56  yrs. old GENDER: Female  ENDOSCOPIST: Jonette Eva, MD REFERRED [id number] Regino Schultze, M.D.  PROCEDURE DATE: 02/15/2012 PROCEDURE:   EGD w/ biopsy  INDICATIONS:abdominal pain in the upper right quadrant.   chronic diarrhea. USE NSAIDS. LAST EGD/DIL/TCS BY DR. Jena Gauss IN 2011.  MEDICATIONS: Demerol 100 mg IV and Versed 5 mg IV TOPICAL ANESTHETIC:   Cetacaine Spray  DESCRIPTION OF PROCEDURE:     Physical exam was performed.  Informed consent was obtained from the patient after explaining the benefits, risks, and alternatives to the procedure.  The patient was connected to the monitor and placed in the left lateral position.  Continuous oxygen was provided by nasal cannula and IV medicine administered through an indwelling cannula.  After administration of sedation, the patients esophagus was intubated and the EG-[id number] ([id number])  endoscope was advanced under direct visualization to the second portion of the duodenum.  The scope was removed slowly by carefully examining the color, texture, anatomy, and integrity of the mucosa on the way out.  The patient was recovered in endoscopy and discharged home in satisfactory condition.      ESOPHAGUS: The mucosa of the esophagus appeared normal.   IRREGULAR Z LINE.  STOMACH: Mild non-erosive gastritis (inflammation) was found in the gastric antrum.  Multiple biopsies were performed using cold forceps.  DUODENUM: The duodenal mucosa showed no abnormalities in the bulb and second portion of the duodenum.  Cold forcep biopsies were taken in the second portion.  COMPLICATIONS:   None  ENDOSCOPIC IMPRESSION: 1.   The mucosa of the esophagus appeared normal 2.   Non-erosive gastritis (inflammation) was found in the gastric antrum; multiple biopsies 3.   The duodenal  mucosa showed no abnormalities in the bulb and second portion of the duodenum 4.   RUQ PAIN DUE TO FUNCTIONAL BOWEL DISORDER AND GASTRITIS  RECOMMENDATIONS: AWAIT BIOPSY LOW FAT DIET CONTINUE OMEPRAZOLE OPV IN 4 MOS   REPEAT EXAM:   _______________________________ Rosalie DoctorJonette Eva, MD 02/15/2012 2:20 PM       PATIENT NAME:  Ashok Croon MR#: #[id number]

## 2012-02-15 NOTE — Progress Notes (Signed)
Faxed to PCP

## 2012-02-16 NOTE — Progress Notes (Signed)
REVIEWED.  

## 2012-02-18 ENCOUNTER — Telehealth: Payer: Self-pay | Admitting: Gastroenterology

## 2012-02-18 ENCOUNTER — Encounter (HOSPITAL_COMMUNITY): Payer: Self-pay | Admitting: Gastroenterology

## 2012-02-18 NOTE — Telephone Encounter (Signed)
PLEASE CALL PT. HER BIOPSIES SHOW gastritis. HER SMALL BOWEL BIOPSIES ARE NORMAL.   TAKE OMEPRAZOLE 30 MINUTES PRIOR TO MEALS ONCE DAILY FORever.  AVOID TRIGGERS FOR GASTRITIS.   FOLLOW A LOW FAT DIET.   FOLLOW UP IN 4 MOS WITH RMR OR EXTENDER E30.

## 2012-02-18 NOTE — Telephone Encounter (Signed)
Called and informed pt.  

## 2012-02-18 NOTE — Telephone Encounter (Signed)
Path faxed to PCP, appt made  

## 2012-03-15 ENCOUNTER — Other Ambulatory Visit (HOSPITAL_COMMUNITY): Payer: Self-pay | Admitting: Physician Assistant

## 2012-03-15 DIAGNOSIS — Z139 Encounter for screening, unspecified: Secondary | ICD-10-CM

## 2012-04-19 ENCOUNTER — Other Ambulatory Visit (HOSPITAL_COMMUNITY): Payer: Self-pay | Admitting: Physician Assistant

## 2012-04-19 DIAGNOSIS — M549 Dorsalgia, unspecified: Secondary | ICD-10-CM

## 2012-04-19 DIAGNOSIS — R109 Unspecified abdominal pain: Secondary | ICD-10-CM

## 2012-04-21 ENCOUNTER — Other Ambulatory Visit (HOSPITAL_COMMUNITY): Payer: Self-pay | Admitting: Physician Assistant

## 2012-04-21 ENCOUNTER — Ambulatory Visit (HOSPITAL_COMMUNITY)
Admission: RE | Admit: 2012-04-21 | Discharge: 2012-04-21 | Disposition: A | Discharge status: Home / Self Care | Payer: Medicare Other | Source: Ambulatory Visit | Attending: Physician Assistant | Admitting: Physician Assistant

## 2012-04-21 DIAGNOSIS — M549 Dorsalgia, unspecified: Secondary | ICD-10-CM

## 2012-04-21 DIAGNOSIS — R109 Unspecified abdominal pain: Secondary | ICD-10-CM | POA: Insufficient documentation

## 2012-04-21 MED ORDER — IOHEXOL 300 MG/ML  SOLN
100.0000 mL | Freq: Once | INTRAMUSCULAR | Status: AC | PRN
  Administered 2012-04-21: 100 mL via INTRAVENOUS

## 2012-05-05 ENCOUNTER — Ambulatory Visit (HOSPITAL_COMMUNITY)
Admission: RE | Admit: 2012-05-05 | Discharge: 2012-05-05 | Disposition: A | Discharge status: Home / Self Care | Payer: Medicare Other | Source: Ambulatory Visit | Attending: Physician Assistant | Admitting: Physician Assistant

## 2012-05-05 DIAGNOSIS — Z139 Encounter for screening, unspecified: Secondary | ICD-10-CM

## 2012-05-05 DIAGNOSIS — Z1231 Encounter for screening mammogram for malignant neoplasm of breast: Secondary | ICD-10-CM | POA: Insufficient documentation

## 2012-05-06 ENCOUNTER — Ambulatory Visit (HOSPITAL_COMMUNITY): Payer: Medicare Other

## 2012-06-06 ENCOUNTER — Emergency Department (HOSPITAL_COMMUNITY)
Admission: EM | Admit: 2012-06-06 | Discharge: 2012-06-06 | Disposition: A | Discharge status: Home / Self Care | Payer: Medicare Other | Attending: Emergency Medicine | Admitting: Emergency Medicine

## 2012-06-06 ENCOUNTER — Encounter (HOSPITAL_COMMUNITY): Payer: Self-pay | Admitting: *Deleted

## 2012-06-06 DIAGNOSIS — Z860100 Personal history of colon polyps, unspecified: Secondary | ICD-10-CM | POA: Insufficient documentation

## 2012-06-06 DIAGNOSIS — G43909 Migraine, unspecified, not intractable, without status migrainosus: Secondary | ICD-10-CM | POA: Insufficient documentation

## 2012-06-06 DIAGNOSIS — I10 Essential (primary) hypertension: Secondary | ICD-10-CM | POA: Insufficient documentation

## 2012-06-06 DIAGNOSIS — Z79899 Other long term (current) drug therapy: Secondary | ICD-10-CM | POA: Insufficient documentation

## 2012-06-06 DIAGNOSIS — R110 Nausea: Secondary | ICD-10-CM | POA: Insufficient documentation

## 2012-06-06 DIAGNOSIS — Z862 Personal history of diseases of the blood and blood-forming organs and certain disorders involving the immune mechanism: Secondary | ICD-10-CM | POA: Insufficient documentation

## 2012-06-06 DIAGNOSIS — H53149 Visual discomfort, unspecified: Secondary | ICD-10-CM | POA: Insufficient documentation

## 2012-06-06 DIAGNOSIS — F3289 Other specified depressive episodes: Secondary | ICD-10-CM | POA: Insufficient documentation

## 2012-06-06 DIAGNOSIS — Z87891 Personal history of nicotine dependence: Secondary | ICD-10-CM | POA: Insufficient documentation

## 2012-06-06 DIAGNOSIS — M542 Cervicalgia: Secondary | ICD-10-CM | POA: Insufficient documentation

## 2012-06-06 DIAGNOSIS — Z87828 Personal history of other (healed) physical injury and trauma: Secondary | ICD-10-CM | POA: Insufficient documentation

## 2012-06-06 DIAGNOSIS — Z8639 Personal history of other endocrine, nutritional and metabolic disease: Secondary | ICD-10-CM | POA: Insufficient documentation

## 2012-06-06 DIAGNOSIS — K219 Gastro-esophageal reflux disease without esophagitis: Secondary | ICD-10-CM | POA: Insufficient documentation

## 2012-06-06 DIAGNOSIS — F411 Generalized anxiety disorder: Secondary | ICD-10-CM | POA: Insufficient documentation

## 2012-06-06 DIAGNOSIS — Z8719 Personal history of other diseases of the digestive system: Secondary | ICD-10-CM | POA: Insufficient documentation

## 2012-06-06 MED ORDER — KETOROLAC TROMETHAMINE 60 MG/2ML IM SOLN
60.0000 mg | Freq: Once | INTRAMUSCULAR | Status: AC
  Administered 2012-06-06: 60 mg via INTRAMUSCULAR
  Filled 2012-06-06: qty 2

## 2012-06-06 MED ORDER — KETOROLAC TROMETHAMINE 10 MG PO TABS: 10.0000 mg | ORAL_TABLET | Freq: Once | ORAL | Status: DC

## 2012-06-06 MED ORDER — DEXAMETHASONE SODIUM PHOSPHATE 4 MG/ML IJ SOLN
8.0000 mg | Freq: Once | INTRAMUSCULAR | Status: AC
  Administered 2012-06-06: 8 mg via INTRAMUSCULAR
  Filled 2012-06-06: qty 2

## 2012-06-06 MED ORDER — OXYCODONE-ACETAMINOPHEN 5-325 MG PO TABS: 1.0000 | ORAL_TABLET | Freq: Four times a day (QID) | ORAL | Status: DC | PRN

## 2012-06-06 NOTE — ED Provider Notes (Signed)
Medical screening examination/treatment/procedure(s) were performed by non-physician practitioner and as supervising physician I was immediately available for consultation/collaboration.   Aristide Waggle M Haylin Camilli, DO 06/06/12 1854 

## 2012-06-06 NOTE — ED Notes (Signed)
Pt states hx of migraine HA's but never had one this long

## 2012-06-06 NOTE — ED Notes (Signed)
Headache since Wednesday,  Took vicodin without relief. Nausea, no vomiting,  No HI.
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
## 2012-06-06 NOTE — ED Notes (Signed)
Pt with HA since Wednesday, diaphoresis Wednesday and Saturday, + nausea but denies vomiting, feels like tightness to throat as well, took Bayer Migraine at 1100 and Vicodin earlier, unable to see PCP and Urgent Care

## 2012-06-10 ENCOUNTER — Encounter: Payer: Self-pay | Admitting: Gastroenterology

## 2012-06-14 ENCOUNTER — Ambulatory Visit: Payer: Medicare Other | Admitting: Urgent Care

## 2012-07-04 ENCOUNTER — Encounter: Payer: Self-pay | Admitting: Gastroenterology

## 2012-07-04 ENCOUNTER — Ambulatory Visit (INDEPENDENT_AMBULATORY_CARE_PROVIDER_SITE_OTHER): Payer: Medicare Other | Admitting: Gastroenterology

## 2012-07-04 VITALS — BP 156/78 | HR 71 | Temp 97.4°F | Ht 61.0 in | Wt 153.4 lb

## 2012-07-04 DIAGNOSIS — I739 Peripheral vascular disease, unspecified: Secondary | ICD-10-CM

## 2012-07-04 DIAGNOSIS — R109 Unspecified abdominal pain: Secondary | ICD-10-CM

## 2012-07-04 DIAGNOSIS — Z86010 Personal history of colonic polyps: Secondary | ICD-10-CM

## 2012-07-04 DIAGNOSIS — K219 Gastro-esophageal reflux disease without esophagitis: Secondary | ICD-10-CM

## 2012-07-04 DIAGNOSIS — K589 Irritable bowel syndrome without diarrhea: Secondary | ICD-10-CM

## 2012-07-04 DIAGNOSIS — K625 Hemorrhage of anus and rectum: Secondary | ICD-10-CM

## 2012-07-04 NOTE — Assessment & Plan Note (Signed)
History of adenomatous colon polyps. Last colonoscopy 5 years ago. She has chronic diarrhea which is unchanged. She has intermittent bright red blood per rectum with history of hemorrhoids. Discussed with patient, we'll likely offer her colonoscopy for surveillance purposes in the near future. But initially we'll address acute on chronic abdominal pain.

## 2012-07-04 NOTE — Patient Instructions (Addendum)
I will review your CT abdomen with radiologist and notes from Dr. Barnetta Chapel. Once reviewed we will let you know next step in management of your pain.  I will let you know in your next colonoscopy is due after reviewing records.  Call in the meantime if your symptoms worsen.

## 2012-07-04 NOTE — Progress Notes (Signed)
Faxed to PCP

## 2012-07-04 NOTE — Assessment & Plan Note (Signed)
Doing well on omeprazole.
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
## 2012-07-04 NOTE — Assessment & Plan Note (Signed)
Chronic right-sided abdominal pain unrelated to meals or bowel movements. Pain is constant. Multiple abdominal wall hernia repairs with last surgery in July 2012 by Dr. Barnetta Chapel as outlined above. Recent CT showed extensive atherosclerosis of the abdominal vasculature but otherwise was not commented on. Will review CT with radiologist. Symptoms not typical of mesenteric ischemia. Suspect adhesive disease or abdominal wall pain. Could not appreciate any recurrent hernias. Further recommendations to follow once CT scans have been reviewed with radiologist.

## 2012-07-04 NOTE — Assessment & Plan Note (Signed)
Questionable claudication of the lower extremities based on history. Risk factors include history of tobacco use. No audible bruits on exam. Will review vasculature seen on recent CT and further recommendations to follow. She also has a history of bulging disc in the lower lumbar region with possible impingement on the right.

## 2012-07-07 ENCOUNTER — Encounter: Payer: Self-pay | Admitting: Gastroenterology

## 2012-07-07 ENCOUNTER — Telehealth: Payer: Self-pay | Admitting: Gastroenterology

## 2012-07-07 NOTE — Telephone Encounter (Signed)
Patient is c/o a lot of pain in her legs and left side of stomach, she cant hardly walk for periods of time, shes asking to talk to [facility]

## 2012-07-07 NOTE — Telephone Encounter (Signed)
Tried to call pt- she was not at home, spoke with husband- they are wanting to know what the next step is and if [facility] has reviewed the records. She continues to have the same problems she was having at her ov.

## 2012-07-07 NOTE — Telephone Encounter (Signed)
Talked with patient. Now her legs hurt even without ambulation. Bad last couple of days. Still with chronic right sided abd pain. Reviewed CT abd done in 04/2012 with Dr. Tyron Russell. She has significant athlerosclerosis of the aorta and iliac arteries. SMA patent, IMA with some plaque at origin but patent, and celiac about 50% patent. No hernias or intraabdominal findings to explain right sided abd pain.  Discussed with patient. She has had two lumbar surgeries. CAGE procedure.  Last surgery in 1998. Last MRI lumbar region in 2010. Appears she could have some impingment of L2 and L3 on that study.  Advised patient I would discuss with Dr. Jena Gauss. ?needs repeat MRI or referral.  Eventually will need surveillance TCS.

## 2012-07-08 NOTE — Telephone Encounter (Signed)
[name], please make f/u OV with RMR only in 4 weeks for chronic abd pain/diarrhea/due TCS.

## 2012-07-08 NOTE — Telephone Encounter (Signed)
Pt is aware of results. I told her that [name] will be giving her a call on Monday to get her set up for the MRI

## 2012-07-08 NOTE — Telephone Encounter (Addendum)
Discussed with Dr. Jena Gauss. Proceed with MRI lumbar spine with and without contrast. Dx: prior lumbar surgery and h/o known bulging disc, radiculopathy.  Colonoscopy at a later date.   Schedule f/u OV with RMR only 4 weeks for chronic abd pain/diarrhea/due tcs.

## 2012-07-11 ENCOUNTER — Encounter: Payer: Self-pay | Admitting: Internal Medicine

## 2012-07-11 ENCOUNTER — Other Ambulatory Visit: Payer: Self-pay | Admitting: Gastroenterology

## 2012-07-11 DIAGNOSIS — Z9889 Other specified postprocedural states: Secondary | ICD-10-CM

## 2012-07-11 DIAGNOSIS — M5126 Other intervertebral disc displacement, lumbar region: Secondary | ICD-10-CM

## 2012-07-11 NOTE — Telephone Encounter (Signed)
Patient is scheduled for MRI on April 3rd at 10:45 and I have spoke to patients spouse and he is aware of the details

## 2012-07-11 NOTE — Telephone Encounter (Signed)
Pt is aware of OV on 4/22 at 1030 with RMR and appt card was mailed

## 2012-07-14 ENCOUNTER — Ambulatory Visit (HOSPITAL_COMMUNITY)
Admission: RE | Admit: 2012-07-14 | Discharge: 2012-07-14 | Disposition: A | Discharge status: Home / Self Care | Payer: Medicare Other | Source: Ambulatory Visit | Attending: Gastroenterology | Admitting: Gastroenterology

## 2012-07-14 DIAGNOSIS — R1031 Right lower quadrant pain: Secondary | ICD-10-CM | POA: Insufficient documentation

## 2012-07-14 DIAGNOSIS — Z9889 Other specified postprocedural states: Secondary | ICD-10-CM

## 2012-07-14 DIAGNOSIS — M5380 Other specified dorsopathies, site unspecified: Secondary | ICD-10-CM | POA: Insufficient documentation

## 2012-07-14 DIAGNOSIS — M5126 Other intervertebral disc displacement, lumbar region: Secondary | ICD-10-CM

## 2012-07-14 DIAGNOSIS — R209 Unspecified disturbances of skin sensation: Secondary | ICD-10-CM | POA: Insufficient documentation

## 2012-07-14 MED ORDER — GADOBENATE DIMEGLUMINE 529 MG/ML IV SOLN
12.0000 mL | Freq: Once | INTRAVENOUS | Status: AC | PRN
  Administered 2012-07-14: 12 mL via INTRAVENOUS

## 2012-07-18 ENCOUNTER — Telehealth: Payer: Self-pay | Admitting: Internal Medicine

## 2012-07-18 ENCOUNTER — Telehealth: Payer: Self-pay

## 2012-07-18 NOTE — Telephone Encounter (Signed)
Faxed Referral to [facility] and they will call the patient to schedule date & time

## 2012-07-18 NOTE — Telephone Encounter (Signed)
Blood work last week: Creatinine normal. MRI: No definite nerve root compression at L2-L3 but bulging noted (stable).   1. Recommend keep appt with Dr. Jena Gauss. He will reevaluate abdominal pain, determine if CTA of abdomen needed to look more closely at blood vessel flow and schedule for colonoscopy. 2. Recommend referral to neurosurgeon to discuss MRI findings and symptoms to see if anything needs to be done. I think her leg pain is related to her back.

## 2012-07-18 NOTE — Telephone Encounter (Signed)
[name] is calling for MRI and Lab Results, please advise

## 2012-07-18 NOTE — Telephone Encounter (Signed)
Routing to [facility] for review. 

## 2012-07-18 NOTE — Telephone Encounter (Signed)
Pt is aware of results.  [name] can you please referral her to a neurosurgeon

## 2012-07-18 NOTE — Telephone Encounter (Signed)
Pt's husband called and is very up set because they have not heard anything about her MRI and blood work that was done last week. She is hurting really bad. He would like to know what is going on. Please advise

## 2012-07-18 NOTE — Telephone Encounter (Signed)
See other phone note

## 2012-07-21 NOTE — Telephone Encounter (Signed)
Patient is scheduled with Dr Gerlene Fee 07/27/12 at 10:00

## 2012-07-21 NOTE — Telephone Encounter (Signed)
Referral has been sent to [facility] instead of [facility]

## 2012-07-27 ENCOUNTER — Other Ambulatory Visit (HOSPITAL_COMMUNITY): Payer: Self-pay | Admitting: Neurosurgery

## 2012-07-27 ENCOUNTER — Ambulatory Visit (HOSPITAL_COMMUNITY)
Admission: RE | Admit: 2012-07-27 | Discharge: 2012-07-27 | Disposition: A | Discharge status: Home / Self Care | Payer: Medicare Other | Source: Ambulatory Visit | Attending: Neurosurgery | Admitting: Neurosurgery

## 2012-07-27 DIAGNOSIS — M25569 Pain in unspecified knee: Secondary | ICD-10-CM

## 2012-07-27 DIAGNOSIS — I10 Essential (primary) hypertension: Secondary | ICD-10-CM | POA: Insufficient documentation

## 2012-07-27 DIAGNOSIS — E785 Hyperlipidemia, unspecified: Secondary | ICD-10-CM | POA: Insufficient documentation

## 2012-07-27 DIAGNOSIS — I739 Peripheral vascular disease, unspecified: Secondary | ICD-10-CM

## 2012-07-27 DIAGNOSIS — M79609 Pain in unspecified limb: Secondary | ICD-10-CM

## 2012-07-27 NOTE — Progress Notes (Signed)
VASCULAR LAB PRELIMINARY  ARTERIAL  ABI completed:    RIGHT    LEFT    PRESSURE WAVEFORM  PRESSURE WAVEFORM  BRACHIAL 153 Triphasic BRACHIAL 150 Triphasic  DP 148 Biphasic DP 159 Biphasic  AT   AT    PT 159 Biphasic PT 150 Biphasic  PER   PER    GREAT TOE  NA GREAT TOE  NA    RIGHT LEFT  ABI 1.04 1.04   ABIs are within normal limits bilaterally.  07/27/2012 1:30 PM Gertie Fey, RDMS, RDCS

## 2012-08-02 ENCOUNTER — Ambulatory Visit: Payer: Medicare Other | Admitting: Internal Medicine

## 2012-08-04 ENCOUNTER — Encounter: Payer: Self-pay | Admitting: Gastroenterology

## 2012-08-04 ENCOUNTER — Ambulatory Visit (INDEPENDENT_AMBULATORY_CARE_PROVIDER_SITE_OTHER): Payer: Medicare Other | Admitting: Gastroenterology

## 2012-08-04 VITALS — BP 154/80 | HR 65 | Temp 97.2°F | Ht 61.0 in | Wt 153.0 lb

## 2012-08-04 DIAGNOSIS — R1011 Right upper quadrant pain: Secondary | ICD-10-CM

## 2012-08-04 DIAGNOSIS — D369 Benign neoplasm, unspecified site: Secondary | ICD-10-CM

## 2012-08-04 MED ORDER — DICYCLOMINE HCL 10 MG PO CAPS: 10.0000 mg | ORAL_CAPSULE | Freq: Three times a day (TID) | ORAL | Status: DC

## 2012-08-04 MED ORDER — PEG 3350-KCL-NA BICARB-NACL 420 G PO SOLR: 4000.0000 mL | ORAL | Status: DC

## 2012-08-04 NOTE — Assessment & Plan Note (Signed)
Persistent, chronic, gallbladder absent. CT as in HPI without evidence to suggest mesenteric ischemia. Her symptoms do not seem consistent with this; pain is constant, and I question abdominal wall pain vs radiculopathy secondary to spinal issues. She will be seeing specialist again next week. LFTs remain normal.

## 2012-08-04 NOTE — Patient Instructions (Addendum)
We have set you up for a colonoscopy with Dr. Jena Gauss in the near future.   Start taking Bentyl before meals and at bedtime; this is to help with loose stools after eating.   Further recommendations to follow in near future.

## 2012-08-04 NOTE — Assessment & Plan Note (Signed)
57 year old female due for surveillance, last colonoscopy in 2009. Chronic diarrhea unchanged, with postprandial component. Start Bentyl. Prescription provided.   Proceed with TCS with Dr. Jena Gauss in near future: the risks, benefits, and alternatives have been discussed with the patient in detail. The patient states understanding and desires to proceed.
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
## 2012-08-04 NOTE — Progress Notes (Signed)
Cc PCP 

## 2012-08-09 ENCOUNTER — Ambulatory Visit: Payer: Medicare Other | Admitting: Internal Medicine

## 2012-08-10 ENCOUNTER — Encounter (HOSPITAL_COMMUNITY): Payer: Self-pay | Admitting: Pharmacy Technician

## 2012-08-24 ENCOUNTER — Encounter (HOSPITAL_COMMUNITY): Payer: Self-pay | Admitting: *Deleted

## 2012-08-24 ENCOUNTER — Encounter (HOSPITAL_COMMUNITY)
Admission: RE | Disposition: A | Discharge status: Home / Self Care | Payer: Self-pay | Source: Ambulatory Visit | Attending: Internal Medicine

## 2012-08-24 ENCOUNTER — Ambulatory Visit (HOSPITAL_COMMUNITY)
Admission: RE | Admit: 2012-08-24 | Discharge: 2012-08-24 | Disposition: A | Discharge status: Home / Self Care | Payer: Medicare Other | Source: Ambulatory Visit | Attending: Internal Medicine | Admitting: Internal Medicine

## 2012-08-24 DIAGNOSIS — Z860100 Personal history of colon polyps, unspecified: Secondary | ICD-10-CM

## 2012-08-24 DIAGNOSIS — D369 Benign neoplasm, unspecified site: Secondary | ICD-10-CM

## 2012-08-24 DIAGNOSIS — Z1211 Encounter for screening for malignant neoplasm of colon: Secondary | ICD-10-CM

## 2012-08-24 DIAGNOSIS — K5730 Diverticulosis of large intestine without perforation or abscess without bleeding: Secondary | ICD-10-CM

## 2012-08-24 DIAGNOSIS — K7689 Other specified diseases of liver: Secondary | ICD-10-CM | POA: Insufficient documentation

## 2012-08-24 DIAGNOSIS — G8929 Other chronic pain: Secondary | ICD-10-CM | POA: Insufficient documentation

## 2012-08-24 DIAGNOSIS — K620 Anal polyp: Secondary | ICD-10-CM

## 2012-08-24 DIAGNOSIS — I10 Essential (primary) hypertension: Secondary | ICD-10-CM | POA: Insufficient documentation

## 2012-08-24 DIAGNOSIS — K621 Rectal polyp: Secondary | ICD-10-CM

## 2012-08-24 DIAGNOSIS — Z87891 Personal history of nicotine dependence: Secondary | ICD-10-CM | POA: Insufficient documentation

## 2012-08-24 DIAGNOSIS — Z885 Allergy status to narcotic agent status: Secondary | ICD-10-CM | POA: Insufficient documentation

## 2012-08-24 DIAGNOSIS — I700 Atherosclerosis of aorta: Secondary | ICD-10-CM | POA: Insufficient documentation

## 2012-08-24 DIAGNOSIS — F329 Major depressive disorder, single episode, unspecified: Secondary | ICD-10-CM | POA: Insufficient documentation

## 2012-08-24 DIAGNOSIS — Z79899 Other long term (current) drug therapy: Secondary | ICD-10-CM | POA: Insufficient documentation

## 2012-08-24 DIAGNOSIS — K219 Gastro-esophageal reflux disease without esophagitis: Secondary | ICD-10-CM | POA: Insufficient documentation

## 2012-08-24 DIAGNOSIS — F411 Generalized anxiety disorder: Secondary | ICD-10-CM | POA: Insufficient documentation

## 2012-08-24 DIAGNOSIS — Z7982 Long term (current) use of aspirin: Secondary | ICD-10-CM | POA: Insufficient documentation

## 2012-08-24 DIAGNOSIS — R1011 Right upper quadrant pain: Secondary | ICD-10-CM | POA: Insufficient documentation

## 2012-08-24 DIAGNOSIS — E785 Hyperlipidemia, unspecified: Secondary | ICD-10-CM | POA: Insufficient documentation

## 2012-08-24 DIAGNOSIS — Z9109 Other allergy status, other than to drugs and biological substances: Secondary | ICD-10-CM | POA: Insufficient documentation

## 2012-08-24 DIAGNOSIS — R197 Diarrhea, unspecified: Secondary | ICD-10-CM | POA: Insufficient documentation

## 2012-08-24 DIAGNOSIS — I708 Atherosclerosis of other arteries: Secondary | ICD-10-CM | POA: Insufficient documentation

## 2012-08-24 DIAGNOSIS — F3289 Other specified depressive episodes: Secondary | ICD-10-CM | POA: Insufficient documentation

## 2012-08-24 HISTORY — PX: COLONOSCOPY: SHX5424

## 2012-08-24 SURGERY — COLONOSCOPY
Anesthesia: Moderate Sedation

## 2012-08-24 MED ORDER — MIDAZOLAM HCL 5 MG/5ML IJ SOLN
INTRAMUSCULAR | Status: DC | PRN
  Administered 2012-08-24 (×3): 2 mg via INTRAVENOUS

## 2012-08-24 MED ORDER — MEPERIDINE HCL 100 MG/ML IJ SOLN
INTRAMUSCULAR | Status: DC | PRN
  Administered 2012-08-24 (×3): 50 mg via INTRAVENOUS

## 2012-08-24 MED ORDER — MIDAZOLAM HCL 5 MG/5ML IJ SOLN
INTRAMUSCULAR | Status: AC
  Filled 2012-08-24: qty 10

## 2012-08-24 MED ORDER — ONDANSETRON HCL 4 MG/2ML IJ SOLN
INTRAMUSCULAR | Status: AC
  Filled 2012-08-24: qty 2

## 2012-08-24 MED ORDER — STERILE WATER FOR IRRIGATION IR SOLN
Status: DC | PRN
  Administered 2012-08-24: 10:00:00

## 2012-08-24 MED ORDER — MEPERIDINE HCL 100 MG/ML IJ SOLN
INTRAMUSCULAR | Status: AC
  Filled 2012-08-24: qty 1

## 2012-08-24 MED ORDER — MEPERIDINE HCL 100 MG/ML IJ SOLN
INTRAMUSCULAR | Status: AC
  Filled 2012-08-24: qty 2

## 2012-08-24 MED ORDER — ONDANSETRON HCL 4 MG/2ML IJ SOLN
INTRAMUSCULAR | Status: DC | PRN
  Administered 2012-08-24: 4 mg via INTRAVENOUS

## 2012-08-24 MED ORDER — SODIUM CHLORIDE 0.9 % IV SOLN
INTRAVENOUS | Status: DC
  Administered 2012-08-24: 10:00:00 via INTRAVENOUS

## 2012-08-24 NOTE — Op Note (Signed)
[facility] [address] [place], [address]   COLONOSCOPY PROCEDURE REPORT  PATIENT: [name], [name]  MR#:         #[id number] BIRTHDATE: Mar 29, 1956 , 56  yrs. old GENDER: Female ENDOSCOPIST: R.  Roetta Sessions, MD FACP FACG REFERRED BY:  Karleen Hampshire, M.D. PROCEDURE DATE:  08/24/2012 PROCEDURE:     Colonoscopy with biopsy  INDICATIONS: History colonic adenoma  INFORMED CONSENT:  The risks, benefits, alternatives and imponderables including but not limited to bleeding, perforation as well as the possibility of a missed lesion have been reviewed.  The potential for biopsy, lesion removal, etc. have also been discussed.  Questions have been answered.  All parties agreeable. Please see the history and physical in the medical record for more information.  MEDICATIONS: Versed 6 mg IV and Demerol 150 mg IV in divided doses. Zofran 4 mg IV  DESCRIPTION OF PROCEDURE:  After a digital rectal exam was performed, the EC-[id number] ([id number])  colonoscope was advanced from the anus through the rectum and colon to the area of the cecum, ileocecal valve and appendiceal orifice.  The cecum was deeply intubated.  These structures were well-seen and photographed for the record.  From the level of the cecum and ileocecal valve, the scope was slowly and cautiously withdrawn.  The mucosal surfaces were carefully surveyed utilizing scope tip deflection to facilitate fold flattening as needed.  The scope was pulled down into the rectum where a thorough examination including retroflexion was performed.    FINDINGS:  Adequate preparation. (1) anal papilla; (1) diminutive polyp in the rectum at 5 cm from the anal verge; otherwise, the remainder of the rectal mucosa appeared normal. Left-sided diverticulosis; otherwise, the remainder of the colonic mucosa and the distal 5 cm of terminal ileal mucosa appeared normal.  THERAPEUTIC / DIAGNOSTIC MANEUVERS PERFORMED:  The  above-mentioned polyp was cold biopsied/removed  COMPLICATIONS: None  CECAL WITHDRAWAL TIME:  8 minutes  IMPRESSION:  Rectal polyp-removed as described above. Colonic diverticulosis  RECOMMENDATIONS: Followup on pathology.   _______________________________ eSigned:  R. Roetta Sessions, MD FACP [facility] 08/24/2012 10:53 AM   CC:    PATIENT NAME:  [name], [name] MR#: #[id number]

## 2012-08-24 NOTE — OR Nursing (Signed)
Demerol 100mg  wasted before procedure. Vial did not have but 75mg  total. Witnessed by Eual Fines
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
## 2012-08-24 NOTE — Interval H&P Note (Signed)
History and Physical Interval Note:  08/24/2012 10:17 AM  [name]  has presented today for surgery, with the diagnosis of ADENOMATOUS POLYPS  The various methods of treatment have been discussed with the patient and family. After consideration of risks, benefits and other options for treatment, the patient has consented to  Procedure(s) with comments: COLONOSCOPY (N/A) - 10:30 as a surgical intervention .  The patient's history has been reviewed, patient examined, no change in status, stable for surgery.  I have reviewed the patient's chart and labs.  Questions were answered to the patient's satisfaction.       Colonoscopy per plan.The risks, benefits, limitations, alternatives and imponderables have been reviewed with the patient. Questions have been answered. All parties are agreeable.   Eula Listen

## 2012-08-25 ENCOUNTER — Telehealth: Payer: Self-pay | Admitting: General Practice

## 2012-08-25 ENCOUNTER — Encounter: Payer: Self-pay | Admitting: Internal Medicine

## 2012-08-25 NOTE — Telephone Encounter (Signed)
Per RMR patient needs a f/u appt in 6-8 weeks with an extender in our office.  I called that patient to give her an appt on 09/28/12@10 :00am. Her husband stated she was unavailable and he would have her to call me later.

## 2012-08-25 NOTE — Telephone Encounter (Signed)
Patient called back and confirmed appointment date/time.

## 2012-08-26 ENCOUNTER — Encounter (HOSPITAL_COMMUNITY): Payer: Self-pay | Admitting: Internal Medicine

## 2012-09-08 ENCOUNTER — Other Ambulatory Visit: Payer: Self-pay | Admitting: Neurosurgery

## 2012-09-08 DIAGNOSIS — M5416 Radiculopathy, lumbar region: Secondary | ICD-10-CM

## 2012-09-15 ENCOUNTER — Ambulatory Visit
Admission: RE | Admit: 2012-09-15 | Discharge: 2012-09-15 | Disposition: A | Discharge status: Home / Self Care | Payer: Medicare Other | Source: Ambulatory Visit | Attending: Neurosurgery | Admitting: Neurosurgery

## 2012-09-15 VITALS — BP 150/78 | HR 70

## 2012-09-15 DIAGNOSIS — M5416 Radiculopathy, lumbar region: Secondary | ICD-10-CM

## 2012-09-15 MED ORDER — IOHEXOL 180 MG/ML  SOLN
15.0000 mL | Freq: Once | INTRAMUSCULAR | Status: AC | PRN
  Administered 2012-09-15: 15 mL via INTRATHECAL

## 2012-09-15 MED ORDER — DIAZEPAM 5 MG PO TABS
10.0000 mg | ORAL_TABLET | Freq: Once | ORAL | Status: AC
  Administered 2012-09-15: 10 mg via ORAL

## 2012-09-15 MED ORDER — MEPERIDINE HCL 100 MG/ML IJ SOLN
50.0000 mg | Freq: Once | INTRAMUSCULAR | Status: AC
  Administered 2012-09-15: 50 mg via INTRAMUSCULAR

## 2012-09-15 MED ORDER — ONDANSETRON HCL 4 MG/2ML IJ SOLN
4.0000 mg | Freq: Once | INTRAMUSCULAR | Status: AC
  Administered 2012-09-15: 4 mg via INTRAMUSCULAR

## 2012-09-15 NOTE — Progress Notes (Signed)
Discharge instructions explained to pt. 

## 2012-09-28 ENCOUNTER — Ambulatory Visit: Payer: Medicare Other | Admitting: Gastroenterology

## 2012-10-11 ENCOUNTER — Encounter: Payer: Self-pay | Admitting: Internal Medicine

## 2012-10-12 ENCOUNTER — Ambulatory Visit: Payer: Medicare Other | Admitting: Gastroenterology

## 2012-10-12 ENCOUNTER — Telehealth: Payer: Self-pay | Admitting: Gastroenterology

## 2012-10-12 NOTE — Telephone Encounter (Signed)
Pt was a no show

## 2013-05-09 ENCOUNTER — Emergency Department (HOSPITAL_COMMUNITY): Payer: No Typology Code available for payment source

## 2013-05-09 ENCOUNTER — Emergency Department (HOSPITAL_COMMUNITY)
Admission: EM | Admit: 2013-05-09 | Discharge: 2013-05-09 | Disposition: A | Discharge status: Home / Self Care | Payer: No Typology Code available for payment source | Attending: Emergency Medicine | Admitting: Emergency Medicine

## 2013-05-09 ENCOUNTER — Encounter (HOSPITAL_COMMUNITY): Payer: Self-pay | Admitting: Emergency Medicine

## 2013-05-09 DIAGNOSIS — Z8739 Personal history of other diseases of the musculoskeletal system and connective tissue: Secondary | ICD-10-CM | POA: Insufficient documentation

## 2013-05-09 DIAGNOSIS — K219 Gastro-esophageal reflux disease without esophagitis: Secondary | ICD-10-CM | POA: Insufficient documentation

## 2013-05-09 DIAGNOSIS — Z87891 Personal history of nicotine dependence: Secondary | ICD-10-CM | POA: Insufficient documentation

## 2013-05-09 DIAGNOSIS — Z79899 Other long term (current) drug therapy: Secondary | ICD-10-CM | POA: Insufficient documentation

## 2013-05-09 DIAGNOSIS — I10 Essential (primary) hypertension: Secondary | ICD-10-CM | POA: Insufficient documentation

## 2013-05-09 DIAGNOSIS — Z87828 Personal history of other (healed) physical injury and trauma: Secondary | ICD-10-CM | POA: Insufficient documentation

## 2013-05-09 DIAGNOSIS — Z860100 Personal history of colon polyps, unspecified: Secondary | ICD-10-CM | POA: Insufficient documentation

## 2013-05-09 DIAGNOSIS — F411 Generalized anxiety disorder: Secondary | ICD-10-CM | POA: Insufficient documentation

## 2013-05-09 DIAGNOSIS — M19019 Primary osteoarthritis, unspecified shoulder: Secondary | ICD-10-CM

## 2013-05-09 DIAGNOSIS — G8929 Other chronic pain: Secondary | ICD-10-CM | POA: Insufficient documentation

## 2013-05-09 DIAGNOSIS — Y9389 Activity, other specified: Secondary | ICD-10-CM | POA: Insufficient documentation

## 2013-05-09 DIAGNOSIS — K589 Irritable bowel syndrome without diarrhea: Secondary | ICD-10-CM | POA: Insufficient documentation

## 2013-05-09 DIAGNOSIS — R197 Diarrhea, unspecified: Secondary | ICD-10-CM | POA: Insufficient documentation

## 2013-05-09 DIAGNOSIS — Z862 Personal history of diseases of the blood and blood-forming organs and certain disorders involving the immune mechanism: Secondary | ICD-10-CM | POA: Insufficient documentation

## 2013-05-09 DIAGNOSIS — S0993XA Unspecified injury of face, initial encounter: Secondary | ICD-10-CM | POA: Insufficient documentation

## 2013-05-09 DIAGNOSIS — Y92410 Unspecified street and highway as the place of occurrence of the external cause: Secondary | ICD-10-CM | POA: Insufficient documentation

## 2013-05-09 DIAGNOSIS — S199XXA Unspecified injury of neck, initial encounter: Principal | ICD-10-CM

## 2013-05-09 DIAGNOSIS — Z8639 Personal history of other endocrine, nutritional and metabolic disease: Secondary | ICD-10-CM | POA: Insufficient documentation

## 2013-05-09 DIAGNOSIS — M542 Cervicalgia: Secondary | ICD-10-CM

## 2013-05-09 HISTORY — DX: Cervicalgia: M54.2

## 2013-05-09 HISTORY — DX: Radiculopathy, lumbar region: M54.16

## 2013-05-09 HISTORY — DX: Other chronic pain: G89.29

## 2013-05-09 HISTORY — DX: Irritable bowel syndrome, unspecified: K58.9

## 2013-05-09 HISTORY — DX: Dorsalgia, unspecified: M54.9

## 2013-05-09 MED ORDER — DEXAMETHASONE 6 MG PO TABS: ORAL_TABLET | ORAL | Status: DC

## 2013-05-09 MED ORDER — METHOCARBAMOL 500 MG PO TABS: 500.0000 mg | ORAL_TABLET | Freq: Three times a day (TID) | ORAL | Status: DC

## 2013-05-09 NOTE — Discharge Instructions (Signed)
The x-ray of your shoulder reveals degenerative joint disease problems. There is no fracture or dislocation appreciated. The CT scan of your cervical spine reveals your fusion to be intact. There is no fracture or dislocation appreciated on the CT. There are degenerative changes noted on the CT as well. Please continue your Norco. Please add Decadron and Robaxin to your current medication. Please see Dr. Orson Ape Osteoarthritis Osteoarthritis is a disease that causes soreness and swelling (inflammation) of a joint. It occurs when the cartilage at the affected joint wears down. Cartilage acts as a cushion, covering the ends of bones where they meet to form a joint. Osteoarthritis is the most common form of arthritis. It often occurs in older people. The joints affected most often by this condition include those in the:  Ends of the fingers.  Thumbs.  Neck.  Lower back.  Knees.  Hips. CAUSES  Over time, the cartilage that covers the ends of bones begins to wear away. This causes bone to rub on bone, producing pain and stiffness in the affected joints.  RISK FACTORS Certain factors can increase your chances of having osteoarthritis, including:  Older age.  Excessive body weight.  Overuse of joints. SIGNS AND SYMPTOMS   Pain, swelling, and stiffness in the joint.  Over time, the joint may lose its normal shape.  Small deposits of bone (osteophytes) may grow on the edges of the joint.  Bits of bone or cartilage can break off and float inside the joint space. This may cause more pain and damage. DIAGNOSIS  Your health care provider will do a physical exam and ask about your symptoms. Various tests may be ordered, such as:  X-rays of the affected joint.  An MRI scan.  Blood tests to rule out other types of arthritis.  Joint fluid tests. This involves using a needle to draw fluid from the joint and examining the fluid under a microscope. TREATMENT  Goals of treatment are to  control pain and improve joint function. Treatment plans may include:  A prescribed exercise program that allows for rest and joint relief.  A weight control plan.  Pain relief techniques, such as:  Properly applied heat and cold.  Electric pulses delivered to nerve endings under the skin (transcutaneous electrical nerve stimulation, TENS).  Massage.  Certain nutritional supplements.  Medicines to control pain, such as:  Acetaminophen.  Nonsteroidal anti-inflammatory drugs (NSAIDs), such as naproxen.  Narcotic or central-acting agents, such as tramadol.  Corticosteroids. These can be given orally or as an injection.  Surgery to reposition the bones and relieve pain (osteotomy) or to remove loose pieces of bone and cartilage. Joint replacement may be needed in advanced states of osteoarthritis. HOME CARE INSTRUCTIONS   Only take over-the-counter or prescription medicines as directed by your health care provider. Take all medicines exactly as instructed.  Maintain a healthy weight. Follow your health care provider's instructions for weight control. This may include dietary instructions.  Exercise as directed. Your health care provider can recommend specific types of exercise. These may include:  Strengthening exercises These are done to strengthen the muscles that support joints affected by arthritis. They can be performed with weights or with exercise bands to add resistance.  Aerobic activities These are exercises, such as brisk walking or low-impact aerobics, that get your heart pumping.  Range-of-motion activities These keep your joints limber.  Balance and agility exercises These help you maintain daily living skills.  Rest your affected joints as directed by your health care  provider.  Follow up with your health care provider as directed. SEEK MEDICAL CARE IF:   Your skin turns red.  You develop a rash in addition to your joint pain.  You have worsening joint  pain. SEEK IMMEDIATE MEDICAL CARE IF:  You have a significant loss of weight or appetite.  You have a fever along with joint or muscle aches.  You have night sweats. [place] of Arthritis and Musculoskeletal and Skin Diseases: www.niams.SouthExposed.es [employer] on Aging: http://kim-miller.com/ American College of Rheumatology: www.rheumatology.org Document Released: 03/30/2005 Document Revised: 01/18/2013 Document Reviewed: 12/05/2012 [facility] [place] Patient Information 2014 [place], [place].  or member of his team next week for recheck and followup of your symptoms.
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
## 2013-05-10 NOTE — ED Provider Notes (Signed)
Medical screening examination/treatment/procedure(s) were performed by non-physician practitioner and as supervising physician I was immediately available for consultation/collaboration.  EKG Interpretation   None         Alfonzo Feller, DO 05/10/13 1524

## 2013-09-19 ENCOUNTER — Observation Stay (HOSPITAL_COMMUNITY): Payer: Medicare Other

## 2013-09-19 ENCOUNTER — Emergency Department (HOSPITAL_COMMUNITY): Payer: Medicare Other

## 2013-09-19 ENCOUNTER — Encounter (HOSPITAL_COMMUNITY): Payer: Self-pay | Admitting: Emergency Medicine

## 2013-09-19 ENCOUNTER — Inpatient Hospital Stay (HOSPITAL_COMMUNITY)
Admission: EM | Admit: 2013-09-19 | Discharge: 2013-09-21 | DRG: 069 | Disposition: A | Discharge status: Home / Self Care | Payer: Medicare Other | Attending: Family Medicine | Admitting: Family Medicine

## 2013-09-19 ENCOUNTER — Inpatient Hospital Stay (HOSPITAL_COMMUNITY): Payer: Medicare Other

## 2013-09-19 DIAGNOSIS — D369 Benign neoplasm, unspecified site: Secondary | ICD-10-CM

## 2013-09-19 DIAGNOSIS — Z7982 Long term (current) use of aspirin: Secondary | ICD-10-CM

## 2013-09-19 DIAGNOSIS — I739 Peripheral vascular disease, unspecified: Secondary | ICD-10-CM

## 2013-09-19 DIAGNOSIS — I10 Essential (primary) hypertension: Secondary | ICD-10-CM | POA: Diagnosis present

## 2013-09-19 DIAGNOSIS — R109 Unspecified abdominal pain: Secondary | ICD-10-CM

## 2013-09-19 DIAGNOSIS — K589 Irritable bowel syndrome without diarrhea: Secondary | ICD-10-CM | POA: Diagnosis present

## 2013-09-19 DIAGNOSIS — M129 Arthropathy, unspecified: Secondary | ICD-10-CM

## 2013-09-19 DIAGNOSIS — Z833 Family history of diabetes mellitus: Secondary | ICD-10-CM

## 2013-09-19 DIAGNOSIS — Z800 Family history of malignant neoplasm of digestive organs: Secondary | ICD-10-CM

## 2013-09-19 DIAGNOSIS — R002 Palpitations: Secondary | ICD-10-CM

## 2013-09-19 DIAGNOSIS — F3289 Other specified depressive episodes: Secondary | ICD-10-CM | POA: Diagnosis present

## 2013-09-19 DIAGNOSIS — Z91199 Patient's noncompliance with other medical treatment and regimen due to unspecified reason: Secondary | ICD-10-CM

## 2013-09-19 DIAGNOSIS — Z8673 Personal history of transient ischemic attack (TIA), and cerebral infarction without residual deficits: Secondary | ICD-10-CM

## 2013-09-19 DIAGNOSIS — K7689 Other specified diseases of liver: Secondary | ICD-10-CM | POA: Diagnosis present

## 2013-09-19 DIAGNOSIS — R1011 Right upper quadrant pain: Secondary | ICD-10-CM

## 2013-09-19 DIAGNOSIS — Z8249 Family history of ischemic heart disease and other diseases of the circulatory system: Secondary | ICD-10-CM

## 2013-09-19 DIAGNOSIS — Z86010 Personal history of colonic polyps: Secondary | ICD-10-CM

## 2013-09-19 DIAGNOSIS — K760 Fatty (change of) liver, not elsewhere classified: Secondary | ICD-10-CM

## 2013-09-19 DIAGNOSIS — G459 Transient cerebral ischemic attack, unspecified: Secondary | ICD-10-CM | POA: Diagnosis present

## 2013-09-19 DIAGNOSIS — R55 Syncope and collapse: Secondary | ICD-10-CM | POA: Diagnosis present

## 2013-09-19 DIAGNOSIS — G8929 Other chronic pain: Secondary | ICD-10-CM | POA: Diagnosis present

## 2013-09-19 DIAGNOSIS — K219 Gastro-esophageal reflux disease without esophagitis: Secondary | ICD-10-CM | POA: Diagnosis present

## 2013-09-19 DIAGNOSIS — E785 Hyperlipidemia, unspecified: Secondary | ICD-10-CM | POA: Diagnosis present

## 2013-09-19 DIAGNOSIS — R079 Chest pain, unspecified: Secondary | ICD-10-CM

## 2013-09-19 DIAGNOSIS — R1310 Dysphagia, unspecified: Secondary | ICD-10-CM

## 2013-09-19 DIAGNOSIS — R197 Diarrhea, unspecified: Secondary | ICD-10-CM | POA: Diagnosis present

## 2013-09-19 DIAGNOSIS — K625 Hemorrhage of anus and rectum: Secondary | ICD-10-CM

## 2013-09-19 DIAGNOSIS — F329 Major depressive disorder, single episode, unspecified: Secondary | ICD-10-CM | POA: Diagnosis present

## 2013-09-19 DIAGNOSIS — Z9119 Patient's noncompliance with other medical treatment and regimen: Secondary | ICD-10-CM

## 2013-09-19 DIAGNOSIS — M549 Dorsalgia, unspecified: Secondary | ICD-10-CM | POA: Diagnosis present

## 2013-09-19 DIAGNOSIS — Z87891 Personal history of nicotine dependence: Secondary | ICD-10-CM

## 2013-09-19 DIAGNOSIS — F411 Generalized anxiety disorder: Secondary | ICD-10-CM | POA: Diagnosis present

## 2013-09-19 LAB — CBC WITH DIFFERENTIAL/PLATELET
BASOS ABS: 0 10*3/uL (ref 0.0–0.1)
Basophils Relative: 0 % (ref 0–1)
EOS ABS: 0.1 10*3/uL (ref 0.0–0.7)
EOS PCT: 1 % (ref 0–5)
HEMATOCRIT: 40.1 % (ref 36.0–46.0)
Hemoglobin: 13.6 g/dL (ref 12.0–15.0)
LYMPHS ABS: 1.5 10*3/uL (ref 0.7–4.0)
Lymphocytes Relative: 23 % (ref 12–46)
MCH: 30.8 pg (ref 26.0–34.0)
MCHC: 33.9 g/dL (ref 30.0–36.0)
MCV: 90.9 fL (ref 78.0–100.0)
MONO ABS: 0.5 10*3/uL (ref 0.1–1.0)
Monocytes Relative: 8 % (ref 3–12)
Neutro Abs: 4.5 10*3/uL (ref 1.7–7.7)
Neutrophils Relative %: 68 % (ref 43–77)
PLATELETS: 175 10*3/uL (ref 150–400)
RBC: 4.41 MIL/uL (ref 3.87–5.11)
RDW: 14.1 % (ref 11.5–15.5)
WBC: 6.6 10*3/uL (ref 4.0–10.5)

## 2013-09-19 LAB — BASIC METABOLIC PANEL
BUN: 7 mg/dL (ref 6–23)
CALCIUM: 9.3 mg/dL (ref 8.4–10.5)
CO2: 27 meq/L (ref 19–32)
CREATININE: 0.76 mg/dL (ref 0.50–1.10)
Chloride: 101 mEq/L (ref 96–112)
GFR calc Af Amer: >90 mL/min (ref >90)
GLUCOSE: 109 mg/dL — AB (ref 70–99)
Potassium: 3.7 mEq/L (ref 3.7–5.3)
SODIUM: 141 meq/L (ref 137–147)

## 2013-09-19 LAB — TROPONIN I
Troponin I: <0.3 ng/mL (ref <0.30)
Troponin I: <0.3 ng/mL (ref <0.30)

## 2013-09-19 MED ORDER — SODIUM CHLORIDE 0.9 % IV BOLUS (SEPSIS): 500.0000 mL | Freq: Once | INTRAVENOUS | Status: DC

## 2013-09-19 MED ORDER — ASPIRIN 325 MG PO TABS
325.0000 mg | ORAL_TABLET | Freq: Once | ORAL | Status: AC
  Administered 2013-09-19: 325 mg via ORAL
  Filled 2013-09-19: qty 1

## 2013-09-19 MED ORDER — IOHEXOL 350 MG/ML SOLN
100.0000 mL | Freq: Once | INTRAVENOUS | Status: AC | PRN
  Administered 2013-09-19: 100 mL via INTRAVENOUS

## 2013-09-19 MED ORDER — ASPIRIN 325 MG PO TABS
325.0000 mg | ORAL_TABLET | Freq: Every day | ORAL | Status: DC
  Administered 2013-09-20 – 2013-09-21 (×2): 325 mg via ORAL
  Filled 2013-09-19 (×3): qty 1

## 2013-09-19 MED ORDER — ENOXAPARIN SODIUM 40 MG/0.4ML ~~LOC~~ SOLN
40.0000 mg | SUBCUTANEOUS | Status: DC
  Administered 2013-09-19 – 2013-09-20 (×2): 40 mg via SUBCUTANEOUS
  Filled 2013-09-19 (×2): qty 0.4
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
## 2013-09-19 NOTE — ED Notes (Signed)
Pt states she was at the school in the [place] waiting on her granddaughter and was found by a school employee to be unresponsive; pt states she has not felt well all week but today she was having pain in her right armpit; pt state she has hx of high blood pressure and was recently started on a new medication; pt states she has had some numbness in the right arm that has gotten better and numbness to her lips

## 2013-09-20 ENCOUNTER — Inpatient Hospital Stay (HOSPITAL_COMMUNITY): Payer: Medicare Other

## 2013-09-20 ENCOUNTER — Encounter (HOSPITAL_COMMUNITY): Payer: Self-pay | Admitting: Cardiology

## 2013-09-20 ENCOUNTER — Encounter (HOSPITAL_COMMUNITY): Payer: Self-pay

## 2013-09-20 DIAGNOSIS — G459 Transient cerebral ischemic attack, unspecified: Secondary | ICD-10-CM

## 2013-09-20 LAB — LIPID PANEL
CHOL/HDL RATIO: 7.1 ratio
CHOLESTEROL: 261 mg/dL — AB (ref 0–200)
HDL: 37 mg/dL — AB (ref >39)
LDL CALC: 188 mg/dL — AB (ref 0–99)
Triglycerides: 181 mg/dL — ABNORMAL HIGH (ref <150)
VLDL: 36 mg/dL (ref 0–40)

## 2013-09-20 LAB — GLUCOSE, CAPILLARY
GLUCOSE-CAPILLARY: 110 mg/dL — AB (ref 70–99)
GLUCOSE-CAPILLARY: 119 mg/dL — AB (ref 70–99)
Glucose-Capillary: 122 mg/dL — ABNORMAL HIGH (ref 70–99)
Glucose-Capillary: 167 mg/dL — ABNORMAL HIGH (ref 70–99)

## 2013-09-20 LAB — RAPID URINE DRUG SCREEN, HOSP PERFORMED
Amphetamines: NOT DETECTED
BARBITURATES: NOT DETECTED
BENZODIAZEPINES: NOT DETECTED
Cocaine: NOT DETECTED
Opiates: NOT DETECTED
Tetrahydrocannabinol: POSITIVE — AB

## 2013-09-20 LAB — HEMOGLOBIN A1C
Hgb A1c MFr Bld: 6 % — ABNORMAL HIGH (ref <5.7)
Mean Plasma Glucose: 126 mg/dL — ABNORMAL HIGH (ref <117)

## 2013-09-20 LAB — TROPONIN I

## 2013-09-20 MED ORDER — BUSPIRONE HCL 5 MG PO TABS
10.0000 mg | ORAL_TABLET | Freq: Two times a day (BID) | ORAL | Status: DC
  Administered 2013-09-20 – 2013-09-21 (×3): 10 mg via ORAL
  Filled 2013-09-20 (×3): qty 2

## 2013-09-20 MED ORDER — ATORVASTATIN CALCIUM 40 MG PO TABS
40.0000 mg | ORAL_TABLET | Freq: Every day | ORAL | Status: DC
  Administered 2013-09-20: 40 mg via ORAL
  Filled 2013-09-20: qty 1

## 2013-09-20 MED ORDER — TECHNETIUM TC 99M SESTAMIBI - CARDIOLITE
30.0000 | Freq: Once | INTRAVENOUS | Status: AC | PRN
  Administered 2013-09-20: 30 via INTRAVENOUS

## 2013-09-20 MED ORDER — REGADENOSON 0.4 MG/5ML IV SOLN
INTRAVENOUS | Status: AC
Start: 2013-09-20 — End: 2013-09-20
  Administered 2013-09-20: 0.4 mg via INTRAVENOUS
  Filled 2013-09-20: qty 5

## 2013-09-20 MED ORDER — REGADENOSON 0.4 MG/5ML IV SOLN
0.4000 mg | Freq: Once | INTRAVENOUS | Status: DC
  Filled 2013-09-20: qty 5

## 2013-09-20 MED ORDER — SODIUM CHLORIDE 0.9 % IJ SOLN
INTRAMUSCULAR | Status: AC
  Administered 2013-09-20: 10 mL via INTRAVENOUS
  Filled 2013-09-20: qty 10

## 2013-09-20 MED ORDER — TECHNETIUM TC 99M SESTAMIBI GENERIC - CARDIOLITE
10.0000 | Freq: Once | INTRAVENOUS | Status: AC | PRN
  Administered 2013-09-20: 10 via INTRAVENOUS

## 2013-09-20 MED ORDER — AMLODIPINE BESYLATE 5 MG PO TABS
5.0000 mg | ORAL_TABLET | Freq: Every day | ORAL | Status: DC
  Administered 2013-09-20 – 2013-09-21 (×2): 5 mg via ORAL
  Filled 2013-09-20 (×2): qty 1
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
## 2013-09-20 NOTE — Progress Notes (Signed)
Stress Lab Nurses Notes - Shoshanah Dapper 09/20/2013 Reason for doing test: Chest Pain and Syncope Type of test: Wille Glaser / Inpatient Rm 311 Nurse performing test: Gerrit Halls, RN Nuclear Medicine Tech: Dyanne Carrel Echo Tech: Not Applicable MD performing test: S. McDowell/K.Lawrence NP Family MD: McGough Test explained and consent signed: yes IV started: 20g jelco, Saline lock flushed and No redness or edema Symptoms: nausea  Treatment/Intervention: None Reason test stopped: protocol completed After recovery IV was: No redness or edema and Saline Lock flushed Patient to return to [facility]. Med at :12:40 Patient discharged: Transported back to room 311 via wc Patient's Condition upon discharge was: stable Comments: During test BP 159/69 & HR 112.  Recovery BP 138/67 & HR 60.  Symptoms resolved in recovery. Geanie Cooley T

## 2013-09-20 NOTE — Care Management Utilization Note (Signed)
UR completed 

## 2013-09-20 NOTE — Progress Notes (Signed)
Initial visit with patient for emotional and spiritual support. She was very expressive about her home and family situation, also about the stress she has been under within the past months.Plan to follow up. Her support is somewhat limited.

## 2013-09-20 NOTE — Progress Notes (Signed)
Note: This document was prepared with digital dictation and possible smart phrase technology. Any transcriptional errors that result from this process are unintentional.   [name] XVQ:[id number] DOB: 02/21/1956 DOA: 09/19/2013 PCP: Leonides Grills, MD  Brief narrative: 58 y/o ? 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 Sitting up in bed without issue. Denies n/v/cp No blurred or double vision   Objective    Interim History: None   Telemetry: Sinus, sinus bready 47 bpm  Objective: Filed Vitals:   09/19/13 1958 09/19/13 2345 09/20/13 0417 09/20/13 1017  BP: 178/83 156/76 155/92 167/74  Pulse: 56 53 54 52  Temp: 97.8 F (36.6 C) 97.7 F (36.5 C) 97.9 F (36.6 C) 97.7 F (36.5 C)  TempSrc: Oral Oral Oral   Resp: 16 16 16 16   Height:  5\' 1"  (1.549 m)    Weight:  70 kg (154 lb 5.2 oz)    SpO2: 96% 93% 96% 95%    Intake/Output Summary (Last 24 hours) at 09/20/13 1324 Last data filed at 09/20/13 0900  Gross per 24 hour  Intake    480 ml  Output      0 ml  Net    480 ml    Exam:  General:  EOMI, NCAT  Cardiovascular:  S1-S2 no murmur rub or gallop  Respiratory:  clinically clear no added sound  Abdomen:  soft nontender nondistended no rebound  SkinNo lower extremity edema Neurorange of motion is intact power 5/5 , no other weakness on any one side of the body, vision by direct confrontation is normal, she has some strabismus  Data Reviewed: Basic Metabolic  Panel:  Recent Labs Lab 09/19/13 1629  NA 141  K 3.7  CL 101  CO2 27  GLUCOSE 109*  BUN 7  CREATININE 0.76  CALCIUM 9.3   Liver Function Tests: No results found for this basename: AST, ALT, ALKPHOS, BILITOT, PROT, ALBUMIN,  in the last 168 hours No results found for this basename: LIPASE, AMYLASE,  in the last 168 hours No results found for this basename: AMMONIA,  in the last 168 hours CBC:  Recent Labs Lab 09/19/13 1629  WBC 6.6  NEUTROABS 4.5  HGB 13.6  HCT 40.1  MCV 90.9  PLT 175   Cardiac Enzymes:  Recent Labs Lab 09/19/13 1629 09/19/13 2117 09/20/13 0133 09/20/13 0732  TROPONINI <0.30 <0.30 <0.30 <0.30   BNP: No components found with this basename: POCBNP,  CBG:  Recent Labs Lab 09/19/13 2343 09/20/13 0831  GLUCAP 110* 119*    No results found for this or any previous visit (from the past 240 hour(s)).   Studies:              All Imaging reviewed and is as per above notation   Scheduled Meds: . amLODipine  5 mg Oral Daily  . aspirin  325 mg Oral Daily  . atorvastatin  40 mg Oral q1800  . enoxaparin (LOVENOX) injection  40 mg Subcutaneous Q24H  . regadenoson  0.4 mg Intravenous Once  Continuous Infusions:    Assessment/Plan: 1.  syncope-patient has had workup done and has had Myoview which is pending. Echocardiogram has been ordered which is pending. MRI brain shows no other findings. We will get orthostatic vital signs and keep on telemetry 24 hours . If no other cause can be found she can probably be safely discharged from  2.  hypertension continue amlodipine 5 mg daily 3. Hyperlipidemia continue atorvastatin 40 daily 4. Anxiety may benefit from BuSpar which will be started 10 mg twice a day  Code Status: full  Family Communication:  none present  Disposition Plan:  inpatient    Verneita Griffes, MD  [facility] Pager [phone] 09/20/2013, 1:24 PM  LOS: 1 day

## 2013-09-20 NOTE — Care Management Note (Addendum)
    Page 1 of 1   09/21/2013     1:30:06 PM CARE MANAGEMENT NOTE 09/21/2013  Patient:  [name], [name]   Account Number:  [id number]  Date Initiated:  09/20/2013  Documentation initiated by:  Theophilus Kinds  Subjective/Objective Assessment:   Pt admitted from home with tia/syncope. Pt lives with her husband and is his primary caregiver. Pt is independent with ADL's.     Action/Plan:   No CM needs noted.   Anticipated DC Date:  09/21/2013   Anticipated DC Plan:  [place]  CM consult      Choice offered to / List presented to:             Status of service:  Completed, signed off Medicare Important Message given?   (If response is "NO", the following Medicare IM given date fields will be blank) Date Medicare IM given:   Date Additional Medicare IM given:    Discharge Disposition:  HOME/SELF CARE  Per UR Regulation:    If discussed at Long Length of Stay Meetings, dates discussed:    Comments:  09/21/13 [place], RN BSN CM Pt discharged home today. No Cm needs noted.  09/20/13 [place], RN BSN CM

## 2013-09-20 NOTE — Progress Notes (Signed)
  Echocardiogram 2D Echocardiogram has been performed.  [place], [place] 09/20/2013, 3:05 PM

## 2013-09-20 NOTE — Progress Notes (Signed)
Nurse from previous shift performed bedside swallow eval.  In report, nurse who performed screening stated the patient passed the bedside swallow eval, however, the charting for the screening is incomplete.  Re-performed the screen, patient passed with no difficulty.  Patient states she has had no trouble swallowing or speaking and to her knowledge passed the swallowing eval during previous shift.

## 2013-09-21 LAB — CBC WITH DIFFERENTIAL/PLATELET
BASOS ABS: 0 10*3/uL (ref 0.0–0.1)
Basophils Relative: 0 % (ref 0–1)
EOS PCT: 2 % (ref 0–5)
Eosinophils Absolute: 0.1 10*3/uL (ref 0.0–0.7)
HCT: 41.6 % (ref 36.0–46.0)
HEMOGLOBIN: 13.8 g/dL (ref 12.0–15.0)
LYMPHS ABS: 1.3 10*3/uL (ref 0.7–4.0)
Lymphocytes Relative: 23 % (ref 12–46)
MCH: 30.4 pg (ref 26.0–34.0)
MCHC: 33.2 g/dL (ref 30.0–36.0)
MCV: 91.6 fL (ref 78.0–100.0)
MONO ABS: 0.4 10*3/uL (ref 0.1–1.0)
Monocytes Relative: 8 % (ref 3–12)
Neutro Abs: 3.8 10*3/uL (ref 1.7–7.7)
Neutrophils Relative %: 67 % (ref 43–77)
Platelets: 196 10*3/uL (ref 150–400)
RBC: 4.54 MIL/uL (ref 3.87–5.11)
RDW: 14.1 % (ref 11.5–15.5)
WBC: 5.6 10*3/uL (ref 4.0–10.5)

## 2013-09-21 LAB — GLUCOSE, CAPILLARY
GLUCOSE-CAPILLARY: 110 mg/dL — AB (ref 70–99)
Glucose-Capillary: 126 mg/dL — ABNORMAL HIGH (ref 70–99)

## 2013-09-21 LAB — BASIC METABOLIC PANEL
BUN: 10 mg/dL (ref 6–23)
CO2: 27 meq/L (ref 19–32)
CREATININE: 0.78 mg/dL (ref 0.50–1.10)
Calcium: 9.2 mg/dL (ref 8.4–10.5)
Chloride: 101 mEq/L (ref 96–112)
GFR calc Af Amer: >90 mL/min (ref >90)
GFR calc non Af Amer: 90 mL/min — ABNORMAL LOW (ref >90)
Glucose, Bld: 143 mg/dL — ABNORMAL HIGH (ref 70–99)
POTASSIUM: 3.9 meq/L (ref 3.7–5.3)
Sodium: 141 mEq/L (ref 137–147)

## 2013-09-21 MED ORDER — BUSPIRONE HCL 10 MG PO TABS: 10.0000 mg | ORAL_TABLET | Freq: Two times a day (BID) | ORAL | Status: DC

## 2013-09-21 MED ORDER — ASPIRIN 81 MG PO CHEW: 81.0000 mg | CHEWABLE_TABLET | Freq: Every day | ORAL | Status: DC

## 2013-09-21 MED ORDER — ATORVASTATIN CALCIUM 40 MG PO TABS: 40.0000 mg | ORAL_TABLET | Freq: Every day | ORAL | Status: DC

## 2013-09-21 NOTE — Discharge Summary (Signed)
Physician Discharge Summary  [name] EGB:[id number] DOB: [birth year] DOA: 09/19/2013  PCP: Leonides Grills, MD  Admit date: 09/19/2013 Discharge date: 09/21/2013  Time spent: 20 minutes  Recommendations for Outpatient Follow-up:  1. Please followup with primary care physician for further management  2. May need alternative therapies to help with depression/anxiety  Discharge Diagnoses:  Active Problems:   HYPERTENSION, BENIGN   DIARRHEA, CHRONIC   Syncope   TIA (transient ischemic attack)   Discharge Condition: Good  Diet recommendation: Heart healthy  Filed Weights   09/19/13 2345  Weight: 70 kg (154 lb 5.2 oz)    History of present illness:  58 y/o ? 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 She states that over the past 2-3 week she's been very exhausted with her duties at school with his sons issues and testing and with her husband who has chronic debility and is wheelchair-bound  She ultimately came into the hospital and mentioned chest pain which prompted leg scan Myoview which was negative and showed an EF of 73% Gen. recurrence of her issues . She was started on discharge on statin because her total cholesterol was 261 and LDL was 188 She was also started on over-the-counter aspirin 81 mg daily  She was counseled regarding behavior modifications as well as biofeedback and started as well on BuSpar     Discharge Exam: Filed Vitals:   09/21/13 1015  BP: 116/66  Pulse: 73  Temp:   Resp:     General: Alert pleasant oriented no apparent distress Cardiovascular: S1-S2 no murmur gallop Respiratory: Clinically clear  Discharge Instructions You were cared for by a hospitalist during your hospital stay. If you have any questions about your discharge medications or the care you received while you were in the hospital after you are discharged, you  can call the unit and asked to speak with the hospitalist on call if the hospitalist that took care of you is not available. Once you are discharged, your primary care physician will handle any further medical issues. Please note that NO REFILLS for any discharge medications will be authorized once you are discharged, as it is imperative that you return to your primary care physician (or establish a relationship with a primary care physician if you do not have one) for your aftercare needs so that they can reassess your need for medications and monitor your lab values.  Discharge Instructions   Diet - low sodium heart healthy    Complete by:  As directed      Discharge instructions    Complete by:  As directed   He probably had a physical manifestation of her acute stress with all of things going on in your life I would recommend taking a cholesterol medication as well as aspirin to prevent any further issues. You do not need any blood pressure medications. In addition I've given you a low dose of the medication called BuSpar which is something to help with anxiety depression and I would recommend only short-term use of this until you can see your regular physician In addition     Increase activity slowly    Complete by:  As directed             Medication List         aspirin 81 MG chewable tablet  Commonly known as:  ASPIRIN CHILDRENS  Chew 1 tablet (81 mg total) by  mouth daily.     atorvastatin 40 MG tablet  Commonly known as:  LIPITOR  Take 1 tablet (40 mg total) by mouth daily at 6 PM.     busPIRone 10 MG tablet  Commonly known as:  BUSPAR  Take 1 tablet (10 mg total) by mouth 2 (two) times daily.       Allergies  Allergen Reactions  . Propoxyphene N-Acetaminophen Itching  . Tape Other (See Comments)    Surgical tape-Tears the skin.      The results of significant diagnostics from this hospitalization (including imaging, microbiology, ancillary and laboratory) are  listed below for reference.    Significant Diagnostic Studies: Dg Chest 2 View  09/19/2013   CLINICAL DATA:  Loss of consciousness  EXAM: CHEST  2 VIEW  COMPARISON:  None.  FINDINGS: The heart size and mediastinal contours are within normal limits. Both lungs are clear. The visualized skeletal structures are unremarkable.  IMPRESSION: No active cardiopulmonary disease.   Electronically Signed   By: Maryclare Bean M.D.   On: 09/19/2013 17:06   Ct Head Wo Contrast  09/19/2013   CLINICAL DATA:  Loss of consciousness, high blood pressure  EXAM: CT HEAD WITHOUT CONTRAST  TECHNIQUE: Contiguous axial images were obtained from the base of the skull through the vertex without intravenous contrast.  COMPARISON:  07/14/2009  FINDINGS: No skull fracture is noted. Paranasal sinuses the and mastoid air cells are unremarkable.  No acute cortical infarction. No mass lesion is noted on this unenhanced scan. Ventricular size is stable from prior exam. No intra or extra-axial fluid collection. The gray and white-matter differentiation is preserved.  IMPRESSION: No acute intracranial abnormality.   Electronically Signed   By: Lahoma Crocker M.D.   On: 09/19/2013 17:14   Ct Angio Chest Pe W/cm &/or Wo Cm  09/19/2013   CLINICAL DATA:  Syncopal episode, right chest pain  EXAM: CT ANGIOGRAPHY CHEST WITH CONTRAST  TECHNIQUE: Multidetector CT imaging of the chest was performed using the standard protocol during bolus administration of intravenous contrast. Multiplanar CT image reconstructions and MIPs were obtained to evaluate the vascular anatomy.  CONTRAST:  147mL OMNIPAQUE IOHEXOL 350 MG/ML SOLN  COMPARISON:  01/16/2008  FINDINGS: Pulmonary arteries are well visualized. No filling defect or significant acute pulmonary embolus demonstrated by CTA. Thoracic atherosclerosis noted. No mediastinal hemorrhage or hematoma. Normal heart size. No pericardial or pleural effusion. Negative for hiatal hernia. No adenopathy. Native coronary  calcifications evident.  Included upper abdomen demonstrates mild fatty infiltration of the liver. No acute upper abdominal findings.  Lung windows demonstrate small left apical subpleural blebs. No acute airspace process, collapse or consolidation. No interstitial process or edema. Negative for pneumothorax.  Progressive mid to lower thoracic spondylosis and degenerative change noted. T9 superior endplate prominent Schmorl's node has developed.  Review of the MIP images confirms the above findings.  IMPRESSION: No significant acute pulmonary embolus demonstrated by CTA.  No acute intra thoracic finding.  Progressive lower thoracic degenerative change.  Fatty infiltration of the liver   Electronically Signed   By: Daryll Brod M.D.   On: 09/19/2013 20:50   Mr Jodene Nam Head Wo Contrast  09/19/2013   CLINICAL DATA:  TIA  EXAM: MRA HEAD WITHOUT CONTRAST  TECHNIQUE: Angiographic images of the Circle of Willis were obtained using MRA technique without intravenous contrast.  COMPARISON:  None.  FINDINGS: Left vertebral artery is dominant and widely patent to the basilar. Right vertebral artery is hypoplastic with minimal contribution to  the basilar. PICA patent bilaterally. Basilar widely patent. Superior cerebellar and posterior cerebral arteries are widely patent bilaterally.  Internal carotid arteries widely patent through the cavernous segment. Anterior and middle cerebral arteries are patent bilaterally. Mild stenosis of the right MCA bifurcation. Left middle cerebral artery branches are widely patent.  Negative for cerebral aneurysm.  IMPRESSION: Mild stenosis at the right middle cerebral artery bifurcation. Otherwise negative.   Electronically Signed   By: Franchot Gallo M.D.   On: 09/19/2013 21:08   Mri Brain Without Contrast  09/19/2013   CLINICAL DATA:  TIA  EXAM: MRI HEAD WITHOUT CONTRAST  TECHNIQUE: Multiplanar, multiecho pulse sequences of the brain and surrounding structures were obtained without intravenous  contrast.  COMPARISON:  CT head 09/19/2013  FINDINGS: Negative for acute infarct.  Mild chronic microvascular ischemic changes in the white matter bilaterally. Chronic infarct in the right putamen. Brainstem and cerebellum intact.  Negative for hemorrhage or mass.  Ventricle size is normal.  Paranasal sinuses are clear.  IMPRESSION: Chronic microvascular ischemic changes, mild.  No acute abnormality.   Electronically Signed   By: Franchot Gallo M.D.   On: 09/19/2013 21:13   US Carotid Bilateral  09/20/2013   CLINICAL DATA:  TIA.  EXAM: BILATERAL CAROTID DUPLEX ULTRASOUND  TECHNIQUE: Pearline Cables scale imaging, color Doppler and duplex ultrasound were performed of bilateral carotid and vertebral arteries in the neck.  COMPARISON:  Brain MRA 09/19/2013  FINDINGS: Criteria: Quantification of carotid stenosis is based on velocity parameters that correlate the residual internal carotid diameter with NASCET-based stenosis levels, using the diameter of the distal internal carotid lumen as the denominator for stenosis measurement.  The following velocity measurements were obtained:  RIGHT  ICA:  91 cm/sec  CCA:  68 cm/sec  SYSTOLIC ICA/CCA RATIO:  1.4  DIASTOLIC ICA/CCA RATIO:  1.9  ECA:  90 cm/sec  LEFT  ICA:  110 cm/sec  CCA:  89 cm/sec  SYSTOLIC ICA/CCA RATIO:  1.2  DIASTOLIC ICA/CCA RATIO:  1.6  ECA:  91 cm/sec  RIGHT CAROTID ARTERY: Echogenic plaque in the proximal internal carotid artery. Waveforms and velocities in the right internal carotid artery are within normal limits. No significant internal carotid artery stenosis.  RIGHT VERTEBRAL ARTERY: Antegrade flow in the right vertebral artery.  LEFT CAROTID ARTERY: Mild intimal thickening in the left common carotid artery. Echogenic plaque in the proximal left internal carotid artery. Normal waveforms and velocities in the left internal carotid artery.  LEFT VERTEBRAL ARTERY: Antegrade flow and normal waveform in the left vertebral artery.  IMPRESSION: Atherosclerotic  disease in the proximal internal carotid arteries bilaterally. Estimated degree of stenosis in the internal carotid arteries is less than 50% bilaterally.   Electronically Signed   By: Markus Daft M.D.   On: 09/20/2013 19:11   Nm Myocar Single W/spect W/wall Motion And Ef  09/20/2013   CLINICAL DATA:  Patient with syncope and chest discomfort referred for an ischemic evaluation.  EXAM: MYOCARDIAL IMAGING WITH SPECT (REST AND PHARMACOLOGIC-STRESS)  GATED LEFT VENTRICULAR WALL MOTION STUDY  LEFT VENTRICULAR EJECTION FRACTION  TECHNIQUE: Standard myocardial SPECT imaging was performed after resting intravenous injection of 10 mCi Tc-71m sestamibi. Subsequently, intravenous infusion of Lexiscan was performed under the supervision of the Cardiology staff. At peak effect of the drug, 30 mCi Tc-10m sestamibi was injected intravenously and standard myocardial SPECT imaging was performed. Quantitative gated imaging was also performed to evaluate left ventricular wall motion, and estimate left ventricular ejection fraction.  COMPARISON:  None.  FINDINGS: The patient was stressed according to [facility] protocol. The heart rate ranged from 51 to 112 beats per min. A blood pressure of 132/77 rose to 159/69. There was no chest pain reported.  Baseline ECG demonstrated sinus bradycardia, heart rate 51 beats per min. With stress, there were no diagnostic ST segment nor T wave abnormalities, nor any arrhythmias.  Analysis of the raw data showed no significant extracardiac radiotracer uptake. Analysis of the perfusion images demonstrated normal homogeneous uptake of the radiotracer in all wall segments on rest and stress images. Left ventricular systolic function and regional wall motion were normal, calculated LVEF 73%.  IMPRESSION: 1.  Normal Lexiscan Cardiolite stress test.  2.  No evidence of myocardial ischemia or scar.  3.  Normal left ventricular systolic function, calculated LVEF 73%.   Electronically Signed   By: Kate Sable   On: 09/20/2013 16:36    Microbiology: No results found for this or any previous visit (from the past 240 hour(s)).   Labs: Basic Metabolic Panel:  Recent Labs Lab 09/19/13 1629 09/21/13 0636  NA 141 141  K 3.7 3.9  CL 101 101  CO2 27 27  GLUCOSE 109* 143*  BUN 7 10  CREATININE 0.76 0.78  CALCIUM 9.3 9.2   Liver Function Tests: No results found for this basename: AST, ALT, ALKPHOS, BILITOT, PROT, ALBUMIN,  in the last 168 hours No results found for this basename: LIPASE, AMYLASE,  in the last 168 hours No results found for this basename: AMMONIA,  in the last 168 hours CBC:  Recent Labs Lab 09/19/13 1629 09/21/13 0636  WBC 6.6 5.6  NEUTROABS 4.5 3.8  HGB 13.6 13.8  HCT 40.1 41.6  MCV 90.9 91.6  PLT 175 196   Cardiac Enzymes:  Recent Labs Lab 09/19/13 1629 09/19/13 2117 09/20/13 0133 09/20/13 0732  TROPONINI <0.30 <0.30 <0.30 <0.30   BNP: BNP (last 3 results) No results found for this basename: PROBNP,  in the last 8760 hours CBG:  Recent Labs Lab 09/20/13 0831 09/20/13 1642 09/20/13 2208 09/21/13 0726 09/21/13 1137  GLUCAP 119* 122* 167* 126* 110*       Signed:  Nita Sells  [facility] 09/21/2013, 12:37 PM

## 2013-09-21 NOTE — Progress Notes (Signed)
IV removed, site WNL.  Pt given d/c instructions and new prescriptions.  Discussed all new home medications (when, how, and why to take), patient verbalizes understanding. Discussed home care with patient, teachback completed. F/U appointment to be made by [facility], pt states they will keep appointment. Pt is stable at this time. Pt taken to main entrance in wheelchair by staff member.

## 2014-04-18 DIAGNOSIS — M546 Pain in thoracic spine: Secondary | ICD-10-CM | POA: Diagnosis not present

## 2014-04-18 DIAGNOSIS — M5134 Other intervertebral disc degeneration, thoracic region: Secondary | ICD-10-CM | POA: Diagnosis not present

## 2014-04-18 DIAGNOSIS — M5090 Cervical disc disorder, unspecified, unspecified cervical region: Secondary | ICD-10-CM | POA: Diagnosis not present

## 2014-04-18 DIAGNOSIS — M961 Postlaminectomy syndrome, not elsewhere classified: Secondary | ICD-10-CM | POA: Diagnosis not present

## 2014-05-08 DIAGNOSIS — M542 Cervicalgia: Secondary | ICD-10-CM | POA: Diagnosis not present

## 2014-05-24 DIAGNOSIS — Z720 Tobacco use: Secondary | ICD-10-CM | POA: Diagnosis not present

## 2014-05-24 DIAGNOSIS — M961 Postlaminectomy syndrome, not elsewhere classified: Secondary | ICD-10-CM | POA: Diagnosis not present

## 2014-05-24 DIAGNOSIS — M542 Cervicalgia: Secondary | ICD-10-CM | POA: Diagnosis not present

## 2014-05-24 DIAGNOSIS — M5092 Cervical disc disorder, unspecified, mid-cervical region: Secondary | ICD-10-CM | POA: Diagnosis not present

## 2014-05-30 DIAGNOSIS — M961 Postlaminectomy syndrome, not elsewhere classified: Secondary | ICD-10-CM | POA: Diagnosis not present

## 2014-05-30 DIAGNOSIS — M5092 Cervical disc disorder, unspecified, mid-cervical region: Secondary | ICD-10-CM | POA: Diagnosis not present

## 2014-06-15 DIAGNOSIS — G4700 Insomnia, unspecified: Secondary | ICD-10-CM | POA: Diagnosis not present

## 2014-06-15 DIAGNOSIS — G894 Chronic pain syndrome: Secondary | ICD-10-CM | POA: Diagnosis not present

## 2014-06-15 DIAGNOSIS — Z6828 Body mass index (BMI) 28.0-28.9, adult: Secondary | ICD-10-CM | POA: Diagnosis not present

## 2014-06-15 DIAGNOSIS — E663 Overweight: Secondary | ICD-10-CM | POA: Diagnosis not present

## 2014-06-26 DIAGNOSIS — M5092 Cervical disc disorder, unspecified, mid-cervical region: Secondary | ICD-10-CM | POA: Diagnosis not present

## 2014-07-10 DIAGNOSIS — M5092 Cervical disc disorder, unspecified, mid-cervical region: Secondary | ICD-10-CM | POA: Diagnosis not present

## 2014-07-10 DIAGNOSIS — M5012 Cervical disc disorder with radiculopathy, mid-cervical region: Secondary | ICD-10-CM | POA: Diagnosis not present

## 2014-07-10 DIAGNOSIS — M542 Cervicalgia: Secondary | ICD-10-CM | POA: Diagnosis not present

## 2014-07-20 DIAGNOSIS — R1314 Dysphagia, pharyngoesophageal phase: Secondary | ICD-10-CM | POA: Diagnosis not present

## 2014-07-20 DIAGNOSIS — R490 Dysphonia: Secondary | ICD-10-CM | POA: Diagnosis not present

## 2014-07-20 DIAGNOSIS — I10 Essential (primary) hypertension: Secondary | ICD-10-CM | POA: Diagnosis not present

## 2014-07-20 DIAGNOSIS — J449 Chronic obstructive pulmonary disease, unspecified: Secondary | ICD-10-CM | POA: Diagnosis not present

## 2014-07-20 DIAGNOSIS — Z01810 Encounter for preprocedural cardiovascular examination: Secondary | ICD-10-CM | POA: Diagnosis not present

## 2014-07-20 DIAGNOSIS — E782 Mixed hyperlipidemia: Secondary | ICD-10-CM | POA: Diagnosis not present

## 2014-07-20 DIAGNOSIS — Z01812 Encounter for preprocedural laboratory examination: Secondary | ICD-10-CM | POA: Diagnosis not present

## 2014-07-26 ENCOUNTER — Other Ambulatory Visit (HOSPITAL_COMMUNITY): Payer: Self-pay | Admitting: Family Medicine

## 2014-07-26 DIAGNOSIS — Z1231 Encounter for screening mammogram for malignant neoplasm of breast: Secondary | ICD-10-CM

## 2014-08-13 DIAGNOSIS — M5012 Cervical disc disorder with radiculopathy, mid-cervical region: Secondary | ICD-10-CM | POA: Diagnosis not present

## 2014-08-14 ENCOUNTER — Encounter (HOSPITAL_COMMUNITY)
Admission: RE | Admit: 2014-08-14 | Discharge: 2014-08-14 | Disposition: A | Discharge status: Home / Self Care | Payer: Medicare Other | Source: Ambulatory Visit | Attending: Orthopedic Surgery | Admitting: Orthopedic Surgery

## 2014-08-14 ENCOUNTER — Encounter (HOSPITAL_COMMUNITY): Payer: Self-pay

## 2014-08-14 DIAGNOSIS — K219 Gastro-esophageal reflux disease without esophagitis: Secondary | ICD-10-CM | POA: Diagnosis not present

## 2014-08-14 DIAGNOSIS — F418 Other specified anxiety disorders: Secondary | ICD-10-CM | POA: Diagnosis not present

## 2014-08-14 DIAGNOSIS — M5412 Radiculopathy, cervical region: Secondary | ICD-10-CM | POA: Diagnosis not present

## 2014-08-14 DIAGNOSIS — M5032 Other cervical disc degeneration, mid-cervical region: Secondary | ICD-10-CM | POA: Diagnosis not present

## 2014-08-14 DIAGNOSIS — I10 Essential (primary) hypertension: Secondary | ICD-10-CM | POA: Diagnosis not present

## 2014-08-14 DIAGNOSIS — Z87891 Personal history of nicotine dependence: Secondary | ICD-10-CM | POA: Diagnosis not present

## 2014-08-14 HISTORY — DX: Chronic obstructive pulmonary disease, unspecified: J44.9

## 2014-08-14 LAB — CBC
HCT: 39.6 % (ref 36.0–46.0)
Hemoglobin: 13.6 g/dL (ref 12.0–15.0)
MCH: 31.2 pg (ref 26.0–34.0)
MCHC: 34.3 g/dL (ref 30.0–36.0)
MCV: 90.8 fL (ref 78.0–100.0)
PLATELETS: 154 10*3/uL (ref 150–400)
RBC: 4.36 MIL/uL (ref 3.87–5.11)
RDW: 13.8 % (ref 11.5–15.5)
WBC: 5.4 10*3/uL (ref 4.0–10.5)

## 2014-08-14 LAB — BASIC METABOLIC PANEL
Anion gap: 9 (ref 5–15)
BUN: 7 mg/dL (ref 6–20)
CO2: 29 mmol/L (ref 22–32)
Calcium: 9.2 mg/dL (ref 8.9–10.3)
Chloride: 103 mmol/L (ref 101–111)
Creatinine, Ser: 0.95 mg/dL (ref 0.44–1.00)
GLUCOSE: 129 mg/dL — AB (ref 70–99)
Potassium: 3.6 mmol/L (ref 3.5–5.1)
SODIUM: 141 mmol/L (ref 135–145)

## 2014-08-14 LAB — SURGICAL PCR SCREEN
MRSA, PCR: NEGATIVE
Staphylococcus aureus: POSITIVE — AB

## 2014-08-14 NOTE — Progress Notes (Signed)
No cardiologist,  PCP: Dr. Hilma Favors

## 2014-08-14 NOTE — Pre-Procedure Instructions (Signed)
[place]  08/14/2014   Your procedure is scheduled on:  Thursday, May 5  Report to [facility] Main Entrance "A" at 8:30 AM.  Call this number if you have problems the morning of surgery: [phone]               Questions prior to surgery call pre-admission [phone](Monday-Friday 8:00am:00pm)-   Remember:  Bring brace   Do not eat food or drink liquids after midnight on Wednesday May 4    Take these medicines the morning of surgery with A SIP OF WATER: hydrocodone if needed, buspar               Stop aspirin, herbal medications and nonsteroidal anti-inflammatory drugs: advil , ibuprofen, motrin,naproxen   Do not wear jewelry, make-up or nail polish.  Do not wear lotions, powders, or perfumes. You may wear deodorant.  Do not shave 48 hours prior to surgery. Men may shave face and neck.  Do not bring valuables to the hospital.  [facility] is not responsible   for any belongings or valuables.               Contacts, dentures or bridgework may not be worn into surgery.  Leave suitcase in the car. After surgery it may be brought to your room.  For patients admitted to the hospital, discharge time is determined by your                treatment team.               Patients discharged the day of surgery will not be allowed to drive  home.  Name and phone number of your driver:   Special Instructions: review preparing for surgery handout   Please read over the following fact sheets that you were given: Pain Booklet, Coughing and Deep Breathing, MRSA Information and Surgical Site Infection Prevention

## 2014-08-14 NOTE — Progress Notes (Signed)
Mupirocin Ointment called into [facility] in [place] for positive PCR of staph. Called pt and spoke with her husband giving him results. He voiced understanding.

## 2014-08-15 MED ORDER — DEXAMETHASONE SODIUM PHOSPHATE 4 MG/ML IJ SOLN
4.0000 mg | INTRAMUSCULAR | Status: DC
  Filled 2014-08-15: qty 1

## 2014-08-15 MED ORDER — CEFAZOLIN SODIUM-DEXTROSE 2-3 GM-% IV SOLR
2.0000 g | INTRAVENOUS | Status: AC
  Administered 2014-08-16: 2 g via INTRAVENOUS
  Filled 2014-08-15: qty 50

## 2014-08-15 NOTE — Progress Notes (Signed)
Anesthesia Chart Review:  Patient is a 59 year old female scheduled for C5-6 ACDF tomorrow with Dr. Rolena Infante.  History includes former smoker, HLD, HTN, GERD, anxiety, depression, fatty liver (appearance of liver unremarkable by 04/2012 CT), IBS, COPD. PCP is Dr. Hilma Favors at [facility]. She was seen by cardiologist Dr. Domenic Polite in 09/2013 for syncope and had a non-ischemic stress test, unremarkable echo and no significant carotid disease. MRI/MRA of the brain/head showed mild stenosis of the right MCA bifurcation, but was otherwise negative.  EKG from 07/20/14 (PCP) showed: NSR.  09/20/13 Nuclear stress test:  IMPRESSION: 1. Normal Lexiscan Cardiolite stress test. 2. No evidence of myocardial ischemia or scar. 3. Normal left ventricular systolic function, calculated LVEF 73%.  09/20/13 Echo: Left ventricle: The cavity size was normal. Wall thickness was normal. Systolic function was normal. The estimated ejectionfraction was in the range of 60% to 65%. Wall motion was normal; there were no regional wall motion abnormalities. Leftventricular diastolic function parameters were normal. Trivial TR.  09/20/13 Carotid duplex: IMPRESSION: Atherosclerotic disease in the proximal internal carotid arteries bilaterally. Estimated degree of stenosis in the internal carotid arteries is less than 50% bilaterally.  Preoperative labs noted.  Anticipate that she can proceed as planned.  George Hugh [facility] Stay [facility]/Anesthesiology Phone [phone] 08/15/2014 11:00 AM

## 2014-08-15 NOTE — Progress Notes (Addendum)
Patient called with time change. She said she would try to be here by 530 am

## 2014-08-16 ENCOUNTER — Observation Stay (HOSPITAL_COMMUNITY): Payer: Medicare Other

## 2014-08-16 ENCOUNTER — Ambulatory Visit (HOSPITAL_COMMUNITY): Payer: Medicare Other | Admitting: Vascular Surgery

## 2014-08-16 ENCOUNTER — Observation Stay (HOSPITAL_COMMUNITY)
Admission: RE | Admit: 2014-08-16 | Discharge: 2014-08-17 | Disposition: A | Discharge status: Home / Self Care | Payer: Medicare Other | Source: Ambulatory Visit | Attending: Orthopedic Surgery | Admitting: Orthopedic Surgery

## 2014-08-16 ENCOUNTER — Encounter (HOSPITAL_COMMUNITY): Payer: Self-pay | Admitting: Surgery

## 2014-08-16 ENCOUNTER — Encounter (HOSPITAL_COMMUNITY)
Admission: RE | Disposition: A | Discharge status: Home / Self Care | Payer: Self-pay | Source: Ambulatory Visit | Attending: Orthopedic Surgery

## 2014-08-16 ENCOUNTER — Ambulatory Visit (HOSPITAL_COMMUNITY): Payer: Medicare Other | Admitting: Anesthesiology

## 2014-08-16 DIAGNOSIS — M5010 Cervical disc disorder with radiculopathy, unspecified cervical region: Secondary | ICD-10-CM | POA: Diagnosis not present

## 2014-08-16 DIAGNOSIS — M5012 Cervical disc disorder with radiculopathy, mid-cervical region: Secondary | ICD-10-CM | POA: Diagnosis not present

## 2014-08-16 DIAGNOSIS — M5032 Other cervical disc degeneration, mid-cervical region: Secondary | ICD-10-CM | POA: Diagnosis not present

## 2014-08-16 DIAGNOSIS — I10 Essential (primary) hypertension: Secondary | ICD-10-CM | POA: Insufficient documentation

## 2014-08-16 DIAGNOSIS — M549 Dorsalgia, unspecified: Secondary | ICD-10-CM | POA: Diagnosis not present

## 2014-08-16 DIAGNOSIS — M5412 Radiculopathy, cervical region: Secondary | ICD-10-CM | POA: Insufficient documentation

## 2014-08-16 DIAGNOSIS — Z87891 Personal history of nicotine dependence: Secondary | ICD-10-CM | POA: Insufficient documentation

## 2014-08-16 DIAGNOSIS — K219 Gastro-esophageal reflux disease without esophagitis: Secondary | ICD-10-CM | POA: Insufficient documentation

## 2014-08-16 DIAGNOSIS — Z4889 Encounter for other specified surgical aftercare: Secondary | ICD-10-CM | POA: Diagnosis not present

## 2014-08-16 DIAGNOSIS — M542 Cervicalgia: Secondary | ICD-10-CM | POA: Diagnosis not present

## 2014-08-16 DIAGNOSIS — F418 Other specified anxiety disorders: Secondary | ICD-10-CM | POA: Insufficient documentation

## 2014-08-16 DIAGNOSIS — Z419 Encounter for procedure for purposes other than remedying health state, unspecified: Secondary | ICD-10-CM

## 2014-08-16 DIAGNOSIS — Z981 Arthrodesis status: Secondary | ICD-10-CM

## 2014-08-16 HISTORY — PX: ANTERIOR CERVICAL DECOMP/DISCECTOMY FUSION: SHX1161

## 2014-08-16 LAB — GLUCOSE, CAPILLARY
Glucose-Capillary: 96 mg/dL (ref 70–99)
Glucose-Capillary: 119 mg/dL — ABNORMAL HIGH (ref 70–99)

## 2014-08-16 SURGERY — ANTERIOR CERVICAL DECOMPRESSION/DISCECTOMY FUSION 1 LEVEL
Anesthesia: General | Site: Spine Cervical | Laterality: Left

## 2014-08-16 MED ORDER — METHOCARBAMOL 500 MG PO TABS: 500.0000 mg | ORAL_TABLET | Freq: Three times a day (TID) | ORAL | Status: DC | PRN

## 2014-08-16 MED ORDER — FENTANYL CITRATE (PF) 250 MCG/5ML IJ SOLN
INTRAMUSCULAR | Status: AC
  Filled 2014-08-16: qty 5

## 2014-08-16 MED ORDER — METHOCARBAMOL 500 MG PO TABS
500.0000 mg | ORAL_TABLET | Freq: Four times a day (QID) | ORAL | Status: DC | PRN
  Administered 2014-08-16 – 2014-08-17 (×2): 500 mg via ORAL
  Filled 2014-08-16 (×3): qty 1

## 2014-08-16 MED ORDER — BUPIVACAINE-EPINEPHRINE 0.25% -1:200000 IJ SOLN
INTRAMUSCULAR | Status: DC | PRN
  Administered 2014-08-16: 7 mL

## 2014-08-16 MED ORDER — ACETAMINOPHEN 10 MG/ML IV SOLN
INTRAVENOUS | Status: DC | PRN
  Administered 2014-08-16: 1000 mg via INTRAVENOUS

## 2014-08-16 MED ORDER — METHOCARBAMOL 1000 MG/10ML IJ SOLN
500.0000 mg | Freq: Four times a day (QID) | INTRAVENOUS | Status: DC | PRN
  Filled 2014-08-16: qty 5

## 2014-08-16 MED ORDER — GLYCOPYRROLATE 0.2 MG/ML IJ SOLN
INTRAMUSCULAR | Status: DC | PRN
Start: 2014-08-16 — End: 2014-08-16
  Administered 2014-08-16: 0.6 mg via INTRAVENOUS

## 2014-08-16 MED ORDER — PROPOFOL 10 MG/ML IV BOLUS
INTRAVENOUS | Status: DC | PRN
  Administered 2014-08-16: 160 mg via INTRAVENOUS

## 2014-08-16 MED ORDER — MENTHOL 3 MG MT LOZG
1.0000 | LOZENGE | OROMUCOSAL | Status: DC | PRN
  Filled 2014-08-16: qty 9

## 2014-08-16 MED ORDER — LIDOCAINE HCL (CARDIAC) 20 MG/ML IV SOLN
INTRAVENOUS | Status: DC | PRN
  Administered 2014-08-16: 70 mg via INTRAVENOUS

## 2014-08-16 MED ORDER — DOCUSATE SODIUM 100 MG PO CAPS: 100.0000 mg | ORAL_CAPSULE | Freq: Three times a day (TID) | ORAL | Status: DC | PRN

## 2014-08-16 MED ORDER — OXYCODONE-ACETAMINOPHEN 10-325 MG PO TABS: 1.0000 | ORAL_TABLET | ORAL | Status: DC | PRN

## 2014-08-16 MED ORDER — ONDANSETRON HCL 4 MG/2ML IJ SOLN
INTRAMUSCULAR | Status: AC
  Filled 2014-08-16: qty 2

## 2014-08-16 MED ORDER — ATORVASTATIN CALCIUM 40 MG PO TABS
40.0000 mg | ORAL_TABLET | Freq: Every day | ORAL | Status: DC
  Administered 2014-08-16: 40 mg via ORAL
  Filled 2014-08-16 (×2): qty 1

## 2014-08-16 MED ORDER — NEOSTIGMINE METHYLSULFATE 10 MG/10ML IV SOLN
INTRAVENOUS | Status: AC
  Filled 2014-08-16: qty 1

## 2014-08-16 MED ORDER — MORPHINE SULFATE 2 MG/ML IJ SOLN
1.0000 mg | INTRAMUSCULAR | Status: DC | PRN
  Administered 2014-08-16 (×4): 2 mg via INTRAVENOUS
  Filled 2014-08-16 (×2): qty 1
  Filled 2014-08-16: qty 2

## 2014-08-16 MED ORDER — SIMVASTATIN 10 MG PO TABS: 10.0000 mg | ORAL_TABLET | Freq: Every day | ORAL | Status: DC

## 2014-08-16 MED ORDER — MUPIROCIN 2 % EX OINT
TOPICAL_OINTMENT | Freq: Two times a day (BID) | CUTANEOUS | Status: DC
  Administered 2014-08-16 (×2): via NASAL
  Filled 2014-08-16: qty 22

## 2014-08-16 MED ORDER — BUSPIRONE HCL 10 MG PO TABS
10.0000 mg | ORAL_TABLET | Freq: Two times a day (BID) | ORAL | Status: DC
  Administered 2014-08-16: 10 mg via ORAL
  Filled 2014-08-16 (×3): qty 1

## 2014-08-16 MED ORDER — OXYCODONE HCL 5 MG PO TABS
10.0000 mg | ORAL_TABLET | ORAL | Status: DC | PRN
  Administered 2014-08-16 – 2014-08-17 (×4): 10 mg via ORAL
  Filled 2014-08-16 (×4): qty 2

## 2014-08-16 MED ORDER — ONDANSETRON HCL 4 MG PO TABS: 4.0000 mg | ORAL_TABLET | Freq: Three times a day (TID) | ORAL | Status: DC | PRN

## 2014-08-16 MED ORDER — LIDOCAINE HCL (CARDIAC) 20 MG/ML IV SOLN
INTRAVENOUS | Status: AC
  Filled 2014-08-16: qty 5

## 2014-08-16 MED ORDER — GLYCOPYRROLATE 0.2 MG/ML IJ SOLN
INTRAMUSCULAR | Status: AC
  Filled 2014-08-16: qty 3

## 2014-08-16 MED ORDER — SODIUM CHLORIDE 0.9 % IJ SOLN
3.0000 mL | Freq: Two times a day (BID) | INTRAMUSCULAR | Status: DC
  Administered 2014-08-16 (×2): 3 mL via INTRAVENOUS

## 2014-08-16 MED ORDER — NEOSTIGMINE METHYLSULFATE 10 MG/10ML IV SOLN
INTRAVENOUS | Status: DC | PRN
  Administered 2014-08-16: 4 mg via INTRAVENOUS

## 2014-08-16 MED ORDER — THROMBIN 20000 UNITS EX SOLR
CUTANEOUS | Status: AC
  Filled 2014-08-16: qty 20000

## 2014-08-16 MED ORDER — PHENYLEPHRINE HCL 10 MG/ML IJ SOLN
10.0000 mg | INTRAVENOUS | Status: DC | PRN
  Administered 2014-08-16: 40 ug/min via INTRAVENOUS

## 2014-08-16 MED ORDER — SODIUM CHLORIDE 0.9 % IJ SOLN: 3.0000 mL | INTRAMUSCULAR | Status: DC | PRN

## 2014-08-16 MED ORDER — ONDANSETRON HCL 4 MG/2ML IJ SOLN
INTRAMUSCULAR | Status: DC | PRN
  Administered 2014-08-16: 4 mg via INTRAVENOUS

## 2014-08-16 MED ORDER — INSULIN ASPART 100 UNIT/ML ~~LOC~~ SOLN: 0.0000 [IU] | Freq: Three times a day (TID) | SUBCUTANEOUS | Status: DC

## 2014-08-16 MED ORDER — CEFAZOLIN SODIUM 1-5 GM-% IV SOLN
1.0000 g | Freq: Three times a day (TID) | INTRAVENOUS | Status: AC
  Administered 2014-08-16 (×2): 1 g via INTRAVENOUS
  Filled 2014-08-16 (×2): qty 50

## 2014-08-16 MED ORDER — FENTANYL CITRATE (PF) 100 MCG/2ML IJ SOLN
INTRAMUSCULAR | Status: DC | PRN
  Administered 2014-08-16: 50 ug via INTRAVENOUS
  Administered 2014-08-16: 25 ug via INTRAVENOUS
  Administered 2014-08-16: 75 ug via INTRAVENOUS

## 2014-08-16 MED ORDER — LACTATED RINGERS IV SOLN
INTRAVENOUS | Status: DC | PRN
  Administered 2014-08-16 (×2): via INTRAVENOUS

## 2014-08-16 MED ORDER — ROCURONIUM BROMIDE 100 MG/10ML IV SOLN
INTRAVENOUS | Status: DC | PRN
  Administered 2014-08-16: 50 mg via INTRAVENOUS

## 2014-08-16 MED ORDER — PHENOL 1.4 % MT LIQD
1.0000 | OROMUCOSAL | Status: DC | PRN
  Administered 2014-08-16: 1 via OROMUCOSAL
  Filled 2014-08-16: qty 177

## 2014-08-16 MED ORDER — THROMBIN 20000 UNITS EX SOLR
CUTANEOUS | Status: DC | PRN
  Administered 2014-08-16: 20 mL via TOPICAL

## 2014-08-16 MED ORDER — PROPOFOL 10 MG/ML IV BOLUS
INTRAVENOUS | Status: AC
  Filled 2014-08-16: qty 20

## 2014-08-16 MED ORDER — MIDAZOLAM HCL 5 MG/5ML IJ SOLN
INTRAMUSCULAR | Status: DC | PRN
  Administered 2014-08-16: 1 mg via INTRAVENOUS

## 2014-08-16 MED ORDER — ACETAMINOPHEN 10 MG/ML IV SOLN
INTRAVENOUS | Status: AC
  Filled 2014-08-16: qty 100

## 2014-08-16 MED ORDER — BUPIVACAINE-EPINEPHRINE (PF) 0.25% -1:200000 IJ SOLN
INTRAMUSCULAR | Status: AC
  Filled 2014-08-16: qty 30

## 2014-08-16 MED ORDER — ONDANSETRON HCL 4 MG/2ML IJ SOLN: 4.0000 mg | INTRAMUSCULAR | Status: DC | PRN

## 2014-08-16 MED ORDER — INSULIN ASPART 100 UNIT/ML ~~LOC~~ SOLN: 0.0000 [IU] | SUBCUTANEOUS | Status: DC

## 2014-08-16 MED ORDER — MORPHINE SULFATE 4 MG/ML IJ SOLN
INTRAMUSCULAR | Status: AC
  Filled 2014-08-16: qty 1

## 2014-08-16 MED ORDER — DEXTROSE 5 % IV SOLN
INTRAVENOUS | Status: DC | PRN
  Administered 2014-08-16: 08:00:00 via INTRAVENOUS

## 2014-08-16 MED ORDER — LACTATED RINGERS IV SOLN: INTRAVENOUS | Status: DC

## 2014-08-16 MED ORDER — ROCURONIUM BROMIDE 50 MG/5ML IV SOLN
INTRAVENOUS | Status: AC
  Filled 2014-08-16: qty 1

## 2014-08-16 MED ORDER — ARTIFICIAL TEARS OP OINT
TOPICAL_OINTMENT | OPHTHALMIC | Status: AC
  Filled 2014-08-16: qty 3.5

## 2014-08-16 MED ORDER — MIDAZOLAM HCL 2 MG/2ML IJ SOLN
INTRAMUSCULAR | Status: AC
  Filled 2014-08-16: qty 2

## 2014-08-16 MED ORDER — 0.9 % SODIUM CHLORIDE (POUR BTL) OPTIME
TOPICAL | Status: DC | PRN
  Administered 2014-08-16: 1000 mL

## 2014-08-16 SURGICAL SUPPLY — 69 items
BIT DRILL SKYLINE 12MM (BIT) ×1 IMPLANT
BLADE SURG ROTATE 9660 (MISCELLANEOUS) IMPLANT
BUR EGG ELITE 4.0 (BURR) IMPLANT
BUR EGG ELITE 4.0MM (BURR)
BUR MATCHSTICK NEURO 3.0 LAGG (BURR) IMPLANT
CANISTER SUCTION 2500CC (MISCELLANEOUS) ×3 IMPLANT
CLOSURE STERI-STRIP 1/2X4 (GAUZE/BANDAGES/DRESSINGS) ×1
CLSR STERI-STRIP ANTIMIC 1/2X4 (GAUZE/BANDAGES/DRESSINGS) ×2 IMPLANT
CORDS BIPOLAR (ELECTRODE) ×3 IMPLANT
COVER SURGICAL LIGHT HANDLE (MISCELLANEOUS) ×6 IMPLANT
CRADLE DONUT ADULT HEAD (MISCELLANEOUS) ×3 IMPLANT
DEVICE ENDSKLTN MED 6 7MM (Orthopedic Implant) ×1 IMPLANT
DRAPE C-ARM 42X72 X-RAY (DRAPES) ×3 IMPLANT
DRAPE POUCH INSTRU U-SHP 10X18 (DRAPES) ×3 IMPLANT
DRAPE SURG 17X23 STRL (DRAPES) ×3 IMPLANT
DRAPE U-SHAPE 47X51 STRL (DRAPES) ×3 IMPLANT
DRILL BIT SKYLINE 12MM (BIT) ×2
DRSG MEPILEX BORDER 4X4 (GAUZE/BANDAGES/DRESSINGS) ×3 IMPLANT
DURAPREP 26ML APPLICATOR (WOUND CARE) ×3 IMPLANT
ELECT COATED BLADE 2.86 ST (ELECTRODE) ×3 IMPLANT
ELECT PENCIL ROCKER SW 15FT (MISCELLANEOUS) ×3 IMPLANT
ELECT REM PT RETURN 9FT ADLT (ELECTROSURGICAL) ×3
ELECTRODE REM PT RTRN 9FT ADLT (ELECTROSURGICAL) ×1 IMPLANT
ENDOSKELETON MED 6 7MM (Orthopedic Implant) ×3 IMPLANT
GLOVE BIO SURGEON STRL SZ 6.5 (GLOVE) ×2 IMPLANT
GLOVE BIO SURGEONS STRL SZ 6.5 (GLOVE) ×1
GLOVE BIOGEL PI IND STRL 6.5 (GLOVE) ×1 IMPLANT
GLOVE BIOGEL PI IND STRL 8 (GLOVE) ×1 IMPLANT
GLOVE BIOGEL PI IND STRL 8.5 (GLOVE) ×1 IMPLANT
GLOVE BIOGEL PI INDICATOR 6.5 (GLOVE) ×2
GLOVE BIOGEL PI INDICATOR 8 (GLOVE) ×2
GLOVE BIOGEL PI INDICATOR 8.5 (GLOVE) ×2
GLOVE ORTHO TXT STRL SZ7.5 (GLOVE) ×3 IMPLANT
GLOVE SS BIOGEL STRL SZ 8.5 (GLOVE) ×1 IMPLANT
GLOVE SUPERSENSE BIOGEL SZ 8.5 (GLOVE) ×2
GOWN STRL REUS W/ TWL XL LVL3 (GOWN DISPOSABLE) ×1 IMPLANT
GOWN STRL REUS W/TWL 2XL LVL3 (GOWN DISPOSABLE) ×6 IMPLANT
GOWN STRL REUS W/TWL XL LVL3 (GOWN DISPOSABLE) ×2
KIT BASIN OR (CUSTOM PROCEDURE TRAY) ×3 IMPLANT
KIT ROOM TURNOVER OR (KITS) ×3 IMPLANT
NEEDLE SPNL 18GX3.5 QUINCKE PK (NEEDLE) ×3 IMPLANT
NS IRRIG 1000ML POUR BTL (IV SOLUTION) ×3 IMPLANT
PACK ORTHO CERVICAL (CUSTOM PROCEDURE TRAY) ×3 IMPLANT
PACK ORTHO EXTREMITY (CUSTOM PROCEDURE TRAY) IMPLANT
PACK UNIVERSAL I (CUSTOM PROCEDURE TRAY) ×3 IMPLANT
PAD ARMBOARD 7.5X6 YLW CONV (MISCELLANEOUS) ×6 IMPLANT
PATTIES SURGICAL .25X.25 (GAUZE/BANDAGES/DRESSINGS) IMPLANT
PIN DISTRACTION 14 (PIN) ×3 IMPLANT
PIN RETAINER PRODISC 14 MM (PIN) ×3 IMPLANT
PLATE ONE LEVEL SKYLINE 14MM (Plate) ×3 IMPLANT
PUTTY BONE DBX 2.5 MIS (Bone Implant) ×3 IMPLANT
RESTRAINT LIMB HOLDER UNIV (RESTRAINTS) ×3 IMPLANT
SCREW SKYLINE 14MM SD-VA (Screw) ×3 IMPLANT
SPONGE INTESTINAL PEANUT (DISPOSABLE) ×3 IMPLANT
SPONGE SURGIFOAM ABS GEL 100 (HEMOSTASIS) ×3 IMPLANT
SURGIFLO TRUKIT (HEMOSTASIS) IMPLANT
SUT BONE WAX W31G (SUTURE) ×3 IMPLANT
SUT MON AB 3-0 SH 27 (SUTURE) ×2
SUT MON AB 3-0 SH27 (SUTURE) ×1 IMPLANT
SUT SILK 2 0 (SUTURE)
SUT SILK 2-0 18XBRD TIE 12 (SUTURE) IMPLANT
SUT VIC AB 2-0 CT1 18 (SUTURE) ×3 IMPLANT
SYR BULB IRRIGATION 50ML (SYRINGE) ×3 IMPLANT
SYR CONTROL 10ML LL (SYRINGE) ×3 IMPLANT
TAPE CLOTH 4X10 WHT NS (GAUZE/BANDAGES/DRESSINGS) ×3 IMPLANT
TAPE UMBILICAL COTTON 1/8X30 (MISCELLANEOUS) ×3 IMPLANT
TOWEL OR 17X24 6PK STRL BLUE (TOWEL DISPOSABLE) ×3 IMPLANT
TOWEL OR 17X26 10 PK STRL BLUE (TOWEL DISPOSABLE) ×3 IMPLANT
WATER STERILE IRR 1000ML POUR (IV SOLUTION) ×3 IMPLANT

## 2014-08-16 NOTE — Plan of Care (Signed)
Problem: Consults Goal: Diagnosis - Spinal Surgery Outcome: Completed/Met Date Met:  08/16/14 Cervical Spine Fusion     

## 2014-08-16 NOTE — Anesthesia Postprocedure Evaluation (Signed)
Anesthesia Post Note  Patient: [name]  Procedure(s) Performed: Procedure(s) (LRB): ANTERIOR CERVICAL DECOMPRESSION/DISCECTOMY FUSION 1 LEVEL C5-6 (Left)  Anesthesia type: general  Patient location: PACU  Post pain: Pain level controlled  Post assessment: Patient's Cardiovascular Status Stable  Last Vitals:  Filed Vitals:   08/16/14 1045  BP: 161/72  Pulse:   Temp:   Resp:     Post vital signs: Reviewed and stable  Level of consciousness: sedated  Complications: No apparent anesthesia complications

## 2014-08-16 NOTE — Anesthesia Preprocedure Evaluation (Addendum)
Anesthesia Evaluation  Patient identified by MRN, date of birth, ID band Patient awake    Reviewed: Allergy & Precautions, NPO status , Patient's Chart, lab work & pertinent test results  Airway Mallampati: I  TM Distance: >3 FB Neck ROM: Limited    Dental  (+) Edentulous Upper, Edentulous Lower, Dental Advisory Given   Pulmonary former smoker,    Pulmonary exam normal       Cardiovascular hypertension, Pt. on medications Normal cardiovascular exam    Neuro/Psych Anxiety Depression    GI/Hepatic GERD-  Medicated and Controlled,  Endo/Other    Renal/GU      Musculoskeletal   Abdominal   Peds  Hematology   Anesthesia Other Findings Dental appliances out to labelled cup to family  Reproductive/Obstetrics                            Anesthesia Physical Anesthesia Plan  ASA: III  Anesthesia Plan: General   Post-op Pain Management:    Induction: Intravenous  Airway Management Planned: Oral ETT  Additional Equipment:   Intra-op Plan:   Post-operative Plan: Extubation in OR  Informed Consent: I have reviewed the patients History and Physical, chart, labs and discussed the procedure including the risks, benefits and alternatives for the proposed anesthesia with the patient or authorized representative who has indicated his/her understanding and acceptance.     Plan Discussed with: CRNA and Surgeon  Anesthesia Plan Comments:         Anesthesia Quick Evaluation

## 2014-08-16 NOTE — Op Note (Signed)
[name], [name]             ACCOUNT NO.:  [id number]  MEDICAL RECORD NO.:  [id number]  LOCATION:  [id number]                        FACILITY:  [facility]  PHYSICIAN:  Eligio Angert D. Rolena Infante, M.D. DATE OF BIRTH:  10-Apr-1956  DATE OF PROCEDURE:  08/16/2014 DATE OF DISCHARGE:                              OPERATIVE REPORT   PREOPERATIVE DIAGNOSIS:  Cervical spondylotic radiculopathy.  POSTOPERATIVE DIAGNOSIS:  Cervical spondylotic radiculopathy.  OPERATIVE PROCEDURE:  Anterior cervical diskectomy fusion C5-6.  COMPLICATIONS:  None.  CONDITION:  Stable.  HISTORY:  This is a very pleasant 59 year old woman who has had a previous C6-7 ACDF who did well and now has progressive left radicular arm pain.  Imaging studies demonstrated an adjacent segment spondylotic bone spur causing nerve compression of the exiting C6 nerve root.  As a result of the failure of conservative management consisting of injection, therapy, physiotherapy, activity modification, and medications, she was elected to proceed with surgery.  All appropriate risks, benefits, and alternatives were discussed with the patient and consent was obtained.  OPERATIVE NOTE:  The patient was brought to the operating room, placed supine on the operating table.  After successful induction of general anesthesia and endotracheal intubation, TEDs, SCDs were applied. Inflatable cuff was placed behind the shoulder blades.  Wrist restraints were applied for intraoperative retraction and the anterior cervical spine was prepped and draped in a standard fashion.  Time-out was taken to confirm patient, procedure, and all other pertinent important data. Once that was completed, x-ray was used to confirm a C5-6 disk space. The incision site was infiltrated with 0.25% Marcaine and then I incised on the left-hand side.  Sharp dissection was carried out down to the platysma.  Platysma was sharply incised and exposed along the medial border of the  sternocleidomastoid.  Sharp dissection was carried out down through the deep cervical fascia creating a plane.  I identified the omohyoid, released it from the sling, and this allowed for adequate retraction.  I then mobilized the esophagus and protected it with an appendiceal retractor.  I then palpated the carotid sheath and protected that with a finger.  Using [employer], I then mobilized the remaining deep cervical and prevertebral fascia to expose the anterior longitudinal ligament.  Once this was exposed, I then placed a needle into the 5-6 disk space and took an x-ray to confirm that I was at the appropriate level.  Once this was confirmed, I then mobilized the longus colli muscles from the midbody of 5 to the midbody of 6 bilaterally.  I then placed Caspar retracting blades underneath the longus colli muscle, deflated the endotracheal cuff, expanded the retractor system and reinflated the cuff.  An annulotomy was performed with a 15 blade scalpel.  Then using pituitary rongeurs, I removed the bulk of the disk material.  Once this was done, I then placed distraction pins into the bodies of C6 and C5, gently distracted the disk space and then maintained the with distraction the pins.  I then continued working posteriorly with a small nerve curette to remove the remaining posterior disk material and annulus.  There was 2 small fragments of disk material in the posterolateral left corner which I removed  with a nerve hook and micropituitary rongeur.  Once this was done, I was able to create a plane underneath the posterior longitudinal ligament and resected with a 1-mm Kerrison.  I had an adequate diskectomy at this point in time.  At this point, I rasped the bone edges and trialed with sequential device. I elected to use a Titan titanium size 7 medium cage packed with DBX mix.  I malleted this to the appropriate depth and I had excellent fixation.  I removed the distraction  pins, placed bone wax in the hole to prevent bleeding and then a 6-14 mm DePuy skyline plate to the anterior cervical spine with 14 mm locking screws.  All screws had excellent purchase.  X-rays were satisfactory.  Hardware and graft were in good position.  The patient tolerated the procedure well.  I irrigated the wound, removed the retractors and made sure the esophagus was still free and not entrapped beneath the plate.  Once this was done, I then closed the platysma with interrupted 2-0 Vicryl sutures, and skin with 3-0 Monocryl.  Steri-Strips and dry dressing were applied.  The patient was ultimately extubated, transferred to PACU without incident. At the end of the case, all needle and sponge counts were correct. There were no adverse intraoperative events.     Riel Hirschman D. Rolena Infante, M.D.     DDB/MEDQ  D:  08/16/2014  T:  08/16/2014  Job:  [id number]

## 2014-08-16 NOTE — Transfer of Care (Signed)
Immediate Anesthesia Transfer of Care Note  Patient: [name]  Procedure(s) Performed: Procedure(s): ANTERIOR CERVICAL DECOMPRESSION/DISCECTOMY FUSION 1 LEVEL C5-6 (Left)  Patient Location: PACU  Anesthesia Type:General  Level of Consciousness: awake, sedated and patient cooperative  Airway & Oxygen Therapy: Patient Spontanous Breathing and Patient connected to nasal cannula oxygen  Post-op Assessment: Report given to RN, Post -op Vital signs reviewed and stable and Patient moving all extremities  Post vital signs: Reviewed and stable  Last Vitals:  Filed Vitals:   08/16/14 1013  BP:   Pulse:   Temp: 36.6 C  Resp:     Complications: No apparent anesthesia complications

## 2014-08-16 NOTE — Discharge Instructions (Signed)

## 2014-08-16 NOTE — Progress Notes (Signed)
Pt is still quite sleepy, will open her eyes to name, knows she is in the hospital, denies pain. Report to E. Chrissie Noa RN as primary caregiver.

## 2014-08-16 NOTE — Anesthesia Procedure Notes (Signed)
Procedure Name: Intubation Date/Time: 08/16/2014 7:45 AM Performed by: Williemae Area B Pre-anesthesia Checklist: Patient identified, Emergency Drugs available, Suction available and Patient being monitored Patient Re-evaluated:Patient Re-evaluated prior to inductionOxygen Delivery Method: Circle system utilized Preoxygenation: Pre-oxygenation with 100% oxygen Intubation Type: IV induction Laryngoscope Size: Mac and 3 Grade View: Grade I Tube type: Oral Tube size: 7.5 mm Number of attempts: 1 Airway Equipment and Method: Stylet Placement Confirmation: ETT inserted through vocal cords under direct vision,  breath sounds checked- equal and bilateral and positive ETCO2 Secured at: 21 (cm at gum) cm Tube secured with: Tape Dental Injury: Teeth and Oropharynx as per pre-operative assessment

## 2014-08-16 NOTE — Addendum Note (Signed)
Addendum  created 08/16/14 1510 by Terrill Mohr, CRNA   Modules edited: Anesthesia Attestations

## 2014-08-16 NOTE — H&P (Signed)
History of Present Illness   The patient is a 59 year old female who comes in today for a preoperative History and Physical. The patient is scheduled for a ACDF C6-7 to be performed by Dr. Duane Lope D. Rolena Infante, MD at [facility] on 08-16-14 . Please see the hospital record for complete dictated history and physical. The pt is going down to PT after this visit to get the Aspen collar. She denies hx of cardiac or pulmonary disease. She reports her last A1c was 12. She does not smoke.  The patient has ongoing left radicular C7 pain, numbness, dysesthesias, as well as weakness. All of her questions and her husband's questions were addressed.    Allergies  No Known Drug Allergies04/10/2014  Family History  Depression mother Cancer father and brother  Social History  Number of flights of stairs before winded 1 Marital status married Living situation live with spouse Tobacco use never smoker Tobacco / smoke exposure yes Pain Contract no Illicit drug use no Current work status disabled Children 0 Alcohol use never consumed alcohol Exercise Exercises monthly; does other Drug/Alcohol Rehab (Previously) no Drug/Alcohol Rehab (Currently) no  Medication History Simvastatin (20MG  Tablet, Oral) Active. (qd) Lisinopril (20MG  Tablet, Oral) Active. (qd) Fenofibrate (160MG  Tablet, Oral) Active. (qd) NovoLOG Mix 70/30 FlexPen ((70-30) 100UNIT/ML Susp Pen-inj, Subcutaneous) Active. (50 untis qd) LORazepam (0.5MG  Tablet, Oral) Active. (bid) PARoxetine HCl (40MG  Tablet, Oral) Active. (1.5 tab qd) CarBAMazepine (200MG  Tablet, Oral) Active. (bid) BuPROPion HCl ER (XL) (150MG  Tablet ER 24HR, Oral) Active. (tid) Medications Reconciled  Past Surgical History  Hysterectomy complete (non-cancerous) Spinal Surgery Tonsillectomy Gallbladder Surgery laporoscopic Arthroscopy of Shoulder left Cataract Surgery bilateral Dilation and Curettage of Uterus  Other  Problems Diabetes Mellitus, Type II High blood pressure Anxiety Disorder Depression  Vitals  08/13/2014 7:50 AM Weight: 192 lb Height: 64.5in Body Surface Area: 1.93 m Body Mass Index: 32.45 kg/m  Pulse: 109 (Regular)  BP: 152/84 (Sitting, Right Arm, Standard)  Physical Exam  General General Appearance-Not in acute distress. Orientation-Oriented X3. Build & Nutrition-Well nourished and Well developed.  Integumentary General Characteristics Surgical Scars - no surgical scar evidence of previous cervical surgery. Cervical Spine-Skin examination of the cervical spine is without deformity, skin lesions, lacerations or abrasions.  Chest and Lung Exam Auscultation Breath sounds - Normal and Clear.  Cardiovascular Auscultation Rhythm - Regular rate and rhythm.  Peripheral Vascular Upper Extremity Palpation - Radial pulse - Bilateral - 2+.  Neurologic Sensation Upper Extremity - Left - sensation is diminished in the upper extremity. Right - sensation is intact in the upper extremity. C7 - Left - decreased sensation to light touch. Reflexes Biceps Reflex - Bilateral - 2+. Brachioradialis Reflex - Bilateral - 2+. Triceps Reflex - Bilateral - 2+. Hoffman's Sign - Bilateral - Hoffman's sign not present.  Musculoskeletal Spine/Ribs/Pelvis  Cervical Spine : Inspection and Palpation - Tenderness - no soft tissue tenderness to palpation and no bony tenderness to palpation, bony/soft tissue palpation of the cervical spine and shoulders does not recreate their typical pain. Strength and Tone: Strength: Strength: Strength - Deltoid - Bilateral - 5/5. Biceps - Bilateral - 5/5. Right - 5/5. Triceps - Left - 4+/5. Wrist Extension - Bilateral - 5/5. Hand Grip - Bilateral - 5/5. ROM - Flexion - Full. Extension - Full. Left Lateral Flexion - Full. Right Lateral Flexion - Full. Left Rotation - Full. Right Rotation - Full. Pain - neither flexion or extension is more painful  than the other. Cervical Spine -  Special Testing - axial compression test negative, cross chest impingement test negative. Non-Anatomic Signs - No non-anatomic signs present. Upper Extremity Range of Motion - No truesholder pain with IR/ER of the shoulders.  Assessment & Plan  At this point in time, clinically, the patient has triceps weakness, C7 dermatomal numbness and an MRI confirming C7 nerve compression. This has been going on now for at least four weeks. The patient has already been treated with two Medrol Dosepak and medications. The patient has a clinical exam consistent with C7 weakness, numbness and reflex changes. An MRI confirming C7 nerve compression from an acute disc herniation. We have discussed physical therapy which she definitely wanted to proceed with because she is concerned about the weakness. This is also my concern. She has obvious progressive neurological deficits. She is a diabetic and she states that she does not have excellent control of it and so I am concerned about more steroid use by injections. At this point, we have reviewed the risks of an ACDF, which include infection, bleeding, nerve damage, death, stroke, paralysis, failure to heal, need for further surgery, ongoing or worst pain, loss of bowel or bladder control, throat pain, swallowing difficulty, hoarseness in the voice, need for posterior surgery. All of her questions were addressed. She would like to proceed with this surgery, which I think given the neurological deficit is reasonable. We will go ahead and get clearance from her primary care physician Dr. Seward Carol, and we will also get insurance approval.

## 2014-08-16 NOTE — Evaluation (Signed)
Physical Therapy Evaluation Patient Details Name: [name] MRN: [id number] DOB: [birth year] Today's Date: 08/16/2014   History of Present Illness  Pt is a 59 y/o female admitted s/p ACDF C5-6 on the L on 08/16/14. 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   Clinical Impression  Pt admitted with above diagnosis. Pt currently with functional limitations due to the deficits listed below (see PT Problem List). At the time of PT eval pt was able to tolerate transfer to recliner, however ambulation was deferred due to unsteadiness and pain. Pt with difficulty sitting EOB initially and required increased time to be able to sit up unsupported. Pt will benefit from skilled PT to increase their independence and safety with mobility to allow discharge to the venue listed below. Recommending OT consult and HHPT for ADL training and management of bilateral UE deficits.      Follow Up Recommendations Home health PT;Supervision for mobility/OOB    Equipment Recommendations  None recommended by PT    Recommendations for Other Services OT consult     Precautions / Restrictions Precautions Precautions: Fall;Cervical Required Braces or Orthoses: Cervical Brace Cervical Brace: Hard collar Restrictions Weight Bearing Restrictions: No      Mobility  Bed Mobility Overal bed mobility: Needs Assistance Bed Mobility: Rolling;Sidelying to Sit Rolling: Supervision Sidelying to sit: Min guard       General bed mobility comments: Guarding for safety. Pt sitting EOB and swaying back and forth in jerky manner. Assist to gain and maintain balance.   Transfers Overall transfer level: Needs assistance Equipment used: 1 person hand held assist Transfers: Sit to/from [facility] Sit to Stand: Min guard Stand pivot transfers: Min guard       General transfer comment: Pt was able to power-up to full standing position and take pivotal steps around to the  recliner chair with hands-on guarding for safety. Pt very fatigued and moving slowly with all movement.   Ambulation/Gait             General Gait Details: Further ambulation deferred due to pain and fatigue  Stairs            Wheelchair Mobility    Modified Rankin (Stroke Patients Only)       Balance Overall balance assessment: Needs assistance Sitting-balance support: Feet supported;No upper extremity supported Sitting balance-Leahy Scale: Poor Sitting balance - Comments: Assist and UE support required for initial sitting balance.    Standing balance support: Single extremity supported Standing balance-Leahy Scale: Poor                               Pertinent Vitals/Pain Pain Assessment: 0-10 Pain Score: 7  Pain Location: Operative site Pain Descriptors / Indicators: Operative site guarding Pain Intervention(s): Limited activity within patient's tolerance;Monitored during session;Repositioned    Home Living Family/patient expects to be discharged to:: Private residence Living Arrangements: Spouse/significant other Available Help at Discharge: Family Type of Home: House Home Access: Ramped entrance     Home Layout: Two level;Laundry or work area in [place]: Environmental consultant - 2 wheels;Cane - single point Additional Comments: Equipment is her husband's. Pt is planning to have a friend stay with them to assist her but also care for her husband until pt is able to again.    Prior Function Level of Independence: Independent         Comments: Pt reports she is a  full caregiver for her husband.      Hand Dominance        Extremity/Trunk Assessment   Upper Extremity Assessment: RUE deficits/detail;LUE deficits/detail RUE Deficits / Details: Pt reports pain is worse down her R arm after surgery. Is holding in a very guarded position - flexed elbow, adducted and IR RUE: Unable to fully assess due to pain RUE Sensation: decreased light  touch (N/T in hand/fingers per pt's report) LUE Deficits / Details: Pt reports she has hurt her L shoulder prior to surgery.    Lower Extremity Assessment: Overall WFL for tasks assessed         Communication   Communication: No difficulties  Cognition Arousal/Alertness: Awake/alert Behavior During Therapy: WFL for tasks assessed/performed Overall Cognitive Status: Within Functional Limits for tasks assessed                      General Comments      Exercises        Assessment/Plan    PT Assessment Patient needs continued PT services  PT Diagnosis Difficulty walking;Acute pain   PT Problem List Decreased strength;Decreased range of motion;Decreased activity tolerance;Decreased balance;Decreased mobility;Decreased knowledge of use of DME;Decreased safety awareness;Decreased knowledge of precautions;Pain  PT Treatment Interventions DME instruction;Gait training;Stair training;Functional mobility training;Therapeutic activities;Therapeutic exercise;Neuromuscular re-education;Patient/family education   PT Goals (Current goals can be found in the Care Plan section) Acute Rehab PT Goals Patient Stated Goal: Get home to her husband PT Goal Formulation: With patient Time For Goal Achievement: 08/23/14 Potential to Achieve Goals: Good    Frequency Min 5X/week   Barriers to discharge        Co-evaluation               End of Session Equipment Utilized During Treatment: Gait belt;Cervical collar Activity Tolerance: No increased pain;Patient limited by fatigue Patient left: in chair;with call bell/phone within reach Nurse Communication: Mobility status         Time: 1350-1410 PT Time Calculation (min) (ACUTE ONLY): 20 min   Charges:   PT Evaluation $Initial PT Evaluation Tier I: 1 Procedure     PT G Codes:        Rolinda Roan 2014/08/25, 2:26 PM   Rolinda Roan, PT, DPT Acute Rehabilitation Services Pager: [phone]

## 2014-08-16 NOTE — Brief Op Note (Signed)
08/16/2014  10:08 AM  PATIENT:  [name]  59 y.o. female  PRE-OPERATIVE DIAGNOSIS:  JASON SEGMENT DDD WITH LEFT C6 RADICULAOPATHY  POST-OPERATIVE DIAGNOSIS:  JASON SEGMENT DDD WITH LEFT C6 RADICULAOPATHY  PROCEDURE:  Procedure(s): ANTERIOR CERVICAL DECOMPRESSION/DISCECTOMY FUSION 1 LEVEL C5-6 (Left)  SURGEON:  Surgeon(s) and Role:    * Melina Schools, MD - Primary  PHYSICIAN ASSISTANT:   ASSISTANTS: none   ANESTHESIA:   general  EBL:  Total I/O In: 1050 [I.V.:1050] Out: -   BLOOD ADMINISTERED:none  DRAINS: none   LOCAL MEDICATIONS USED:  MARCAINE     SPECIMEN:  No Specimen  DISPOSITION OF SPECIMEN:  N/A  COUNTS:  YES  TOURNIQUET:  * No tourniquets in log *  DICTATION: .Other Dictation: Dictation Number [id number]  PLAN OF CARE: Admit for overnight observation  PATIENT DISPOSITION:  PACU - hemodynamically stable.

## 2014-08-16 NOTE — Evaluation (Addendum)
Occupational Therapy Evaluation Patient Details Name: [name] MRN: [id number] DOB: 07-20-1955 Today's Date: 08/16/2014    History of Present Illness Pt is a 59 y/o female admitted s/p ACDF C5-6 on the L on 08/16/14. 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    Clinical Impression   PTA pt lived at home and was independent with ADLs. Pt is limited by pain, especially in RUE since surgery, and balance deficits which impair her independence. Pt will benefit from acute OT to progress to Supervision level to return home with assistance from friends/family.     Follow Up Recommendations  Home health OT;Supervision/Assistance - 24 hour    Equipment Recommendations  None recommended by OT    Recommendations for Other Services       Precautions / Restrictions Precautions Precautions: Fall;Cervical Required Braces or Orthoses: Cervical Brace Cervical Brace: Hard collar Restrictions Weight Bearing Restrictions: No      Mobility Bed Mobility Overal bed mobility: Needs Assistance Bed Mobility: Rolling;Sidelying to Sit;Sit to Sidelying Rolling: Supervision Sidelying to sit: Min guard     Sit to sidelying: Min guard General bed mobility comments: VC's for log roll as pt attempted to lay back down.   Transfers Overall transfer level: Needs assistance Equipment used: 1 person hand held assist Transfers: Sit to/from Stand Sit to Stand: Min assist Stand pivot transfers: Min guard       General transfer comment: Min A to stand and ambulate to door and back. Assist to steady for balance    Balance Overall balance assessment: Needs assistance Sitting-balance support: Feet supported;No upper extremity supported Sitting balance-Leahy Scale: Poor Sitting balance - Comments: Assist and UE support required for initial sitting balance.    Standing balance support: Single extremity supported Standing balance-Leahy Scale: Poor                              ADL Overall ADL's : Needs assistance/impaired Eating/Feeding: Independent;Sitting   Grooming: Set up;Sitting   Upper Body Bathing: Minimal assitance;Sitting   Lower Body Bathing: Moderate assistance;Sit to/from stand   Upper Body Dressing : Minimal assistance;Sitting   Lower Body Dressing: Moderate assistance;Sit to/from stand   Toilet Transfer: Minimal assistance;Ambulation (hand held assist)           Functional mobility during ADLs: Minimal assistance (hand held assist) General ADL Comments: Pt able to ambulate to door with LUE hand held assist and returned to bed. Pt c/o RUE pain and positioned for comfort.      Vision Additional Comments: No change from baseline          Pertinent Vitals/Pain Pain Assessment: 0-10 Pain Score: 8  Pain Location: neck; Right arm Pain Descriptors / Indicators: Burning;Aching;Guarding ("just hurts") Pain Intervention(s): Limited activity within patient's tolerance;Monitored during session;Repositioned;Ice applied     Hand Dominance     Extremity/Trunk Assessment Upper Extremity Assessment Upper Extremity Assessment: RUE deficits/detail RUE Deficits / Details: Pt c/o pain throughout RUE and tenderness. Pt unable to explain type of pain stating "it just hurts." Guarded and minimally moving UE due to pain. Pt holding it at shoulder height with elbow bent; pronated.  RUE: Unable to fully assess due to pain RUE Sensation: decreased light touch RUE Coordination: decreased fine motor;decreased gross motor LUE Deficits / Details: Pt reports she has hurt her L shoulder prior to surgery.  LUE: Unable to fully assess due to pain   Lower Extremity  Assessment Lower Extremity Assessment: Defer to PT evaluation   Cervical / Trunk Assessment Cervical / Trunk Assessment: Normal   Communication Communication Communication: No difficulties   Cognition Arousal/Alertness: Awake/alert Behavior During Therapy: WFL for tasks  assessed/performed Overall Cognitive Status: Within Functional Limits for tasks assessed                                Home Living Family/patient expects to be discharged to:: Private residence Living Arrangements: Spouse/significant other Available Help at Discharge: Family Type of Home: House Home Access: Ramped entrance     Home Layout: Two level;Laundry or work area in [place]: Environmental consultant - 2 wheels;Cane - single point   Additional Comments: Equipment is her husband's. Pt is planning to have a friend stay with them to assist her but also care for her husband until pt is able to again.      Prior Functioning/Environment Level of Independence: Independent        Comments: Pt reports she is a full caregiver for her husband.     OT Diagnosis: Generalized weakness;Acute pain   OT Problem List: Decreased strength;Decreased range of motion;Decreased activity tolerance;Impaired balance (sitting and/or standing);Decreased knowledge of precautions;Impaired sensation;Impaired UE functional use;Pain   OT Treatment/Interventions: Self-care/ADL training;Therapeutic exercise;Energy conservation;DME and/or AE instruction;Therapeutic activities;Patient/family education;Balance training    OT Goals(Current goals can be found in the care plan section) Acute Rehab OT Goals Patient Stated Goal: Get home to her husband OT Goal Formulation: With patient Time For Goal Achievement: 08/30/14 Potential to Achieve Goals: Good ADL Goals Pt Will Perform Grooming: with supervision;standing Pt Will Perform Lower Body Bathing: with supervision;sit to/from stand;with set-up Pt Will Perform Lower Body Dressing: with set-up;with supervision;sit to/from stand Pt Will Transfer to Toilet: with supervision;ambulating Pt Will Perform Toileting - Clothing Manipulation and hygiene: with supervision;sit to/from stand  OT Frequency: Min 2X/week    End of Session  Equipment Utilized During Treatment: Cervical collar  Activity Tolerance: Patient limited by pain;Patient limited by fatigue Patient left: in bed;with call bell/phone within reach;with family/visitor present   Time: 3846-6599 OT Time Calculation (min): 15 min Charges:  OT General Charges $OT Visit: 1 Procedure OT Evaluation $Initial OT Evaluation Tier I: 1 Procedure G-Codes:     09-03-14 1700  OT G-codes **NOT FOR INPATIENT CLASS**  Functional Assessment Tool Used clinical judgement  Functional Limitation Self care  Self Care Current Status (J5701) CJ  Self Care Goal Status (X7939) CI    Villa Herb M Sep 03, 2014, 5:46 PM   Secundino Ginger Lynetta Mare, OTR/L Occupational Therapist [phone] (pager)

## 2014-08-17 DIAGNOSIS — M5412 Radiculopathy, cervical region: Secondary | ICD-10-CM | POA: Diagnosis not present

## 2014-08-17 DIAGNOSIS — Z4889 Encounter for other specified surgical aftercare: Secondary | ICD-10-CM | POA: Diagnosis not present

## 2014-08-17 DIAGNOSIS — I10 Essential (primary) hypertension: Secondary | ICD-10-CM | POA: Diagnosis not present

## 2014-08-17 DIAGNOSIS — Z981 Arthrodesis status: Secondary | ICD-10-CM | POA: Diagnosis not present

## 2014-08-17 DIAGNOSIS — M5032 Other cervical disc degeneration, mid-cervical region: Secondary | ICD-10-CM | POA: Diagnosis not present

## 2014-08-17 DIAGNOSIS — Z87891 Personal history of nicotine dependence: Secondary | ICD-10-CM | POA: Diagnosis not present

## 2014-08-17 LAB — GLUCOSE, CAPILLARY
Glucose-Capillary: 134 mg/dL — ABNORMAL HIGH (ref 70–99)
Glucose-Capillary: 116 mg/dL — ABNORMAL HIGH (ref 70–99)

## 2014-08-17 LAB — HEMOGLOBIN A1C
HEMOGLOBIN A1C: 5.9 % — AB (ref 4.8–5.6)
Mean Plasma Glucose: 123 mg/dL

## 2014-08-17 NOTE — Progress Notes (Addendum)
Patient alert and oriented, mae's well, voiding adequate amount of urine, swallowing without difficulty, c/o pain and MD aware and ice pack to pain site. Patient discharged home with family. Script and discharged instructions given to patient. Patient and family stated understanding of d/c instructions given and has an appointment with MD.

## 2014-08-17 NOTE — Progress Notes (Signed)
    Subjective: Procedure(s) (LRB): ANTERIOR CERVICAL DECOMPRESSION/DISCECTOMY FUSION 1 LEVEL C5-6 (Left) 1 Day Post-Op  Patient reports pain as 2 on 0-10 scale.  Reports decreased left arm pain.  Reports right anterior shoulder pain with ROM reports incisional neck pain   Positive void Negative bowel movement Positive flatus Negative chest pain or shortness of breath  Objective: Vital signs in last 24 hours: Temp:  [97.6 F (36.4 C)-99.8 F (37.7 C)] 98.6 F (37 C) (05/06 0750) Pulse Rate:  [63-101] 74 (05/06 0420) Resp:  [13-21] 18 (05/06 0750) BP: (120-177)/(54-97) 120/56 mmHg (05/06 0750) SpO2:  [90 %-96 %] 90 % (05/06 0750)  Intake/Output from previous day: 05/05 0701 - 05/06 0700 In: 1950 [P.O.:600; I.V.:1350] Out: 5 [Blood:5]  Labs:  Recent Labs  08/14/14 1408  WBC 5.4  RBC 4.36  HCT 39.6  PLT 154    Recent Labs  08/14/14 1408  NA 141  K 3.6  CL 103  CO2 29  BUN 7  CREATININE 0.95  GLUCOSE 129*  CALCIUM 9.2   No results for input(s): LABPT, INR in the last 72 hours.  Physical Exam: Neurologically intact ABD soft Intact pulses distally Incision: dressing C/D/I Compartment soft  Assessment/Plan: Patient stable  xrays satisfactory Mobilization with physical therapy Encourage incentive spirometry Continue care  Advance diet Up with therapy Doing well - patient with mild-mod AC arthritis.  No focal motor deficits - pain most likely secondary to positioning during surgery.  Will monitor exam.  However, no focal C5 weakness Plan on d/c to home today  Melina Schools, MD [place] [phone]

## 2014-08-20 ENCOUNTER — Other Ambulatory Visit (HOSPITAL_COMMUNITY): Payer: Self-pay | Admitting: Family Medicine

## 2014-08-20 ENCOUNTER — Encounter (HOSPITAL_COMMUNITY): Payer: Self-pay | Admitting: Orthopedic Surgery

## 2014-08-20 DIAGNOSIS — Z1231 Encounter for screening mammogram for malignant neoplasm of breast: Secondary | ICD-10-CM

## 2014-08-20 NOTE — Progress Notes (Signed)
Physical Therapy Addendum for G-Codes    08/28/2014 1413  PT G-Codes **NOT FOR INPATIENT CLASS**  Functional Assessment Tool Used Clinical judgement  Functional Limitation Mobility: Walking and moving around  Mobility: Walking and Moving Around Current Status [id number]) CI  Mobility: Walking and Moving Around Goal Status (E0335) CI    Rolinda Roan, PT, DPT Acute Rehabilitation Services Pager: [phone]

## 2014-08-22 ENCOUNTER — Ambulatory Visit (HOSPITAL_COMMUNITY)
Admission: RE | Admit: 2014-08-22 | Discharge: 2014-08-22 | Disposition: A | Discharge status: Home / Self Care | Payer: Medicare Other | Source: Ambulatory Visit | Attending: Family Medicine | Admitting: Family Medicine

## 2014-08-22 DIAGNOSIS — Z1231 Encounter for screening mammogram for malignant neoplasm of breast: Secondary | ICD-10-CM | POA: Diagnosis not present
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
## 2014-09-28 DIAGNOSIS — M542 Cervicalgia: Secondary | ICD-10-CM | POA: Diagnosis not present

## 2014-11-02 ENCOUNTER — Ambulatory Visit (INDEPENDENT_AMBULATORY_CARE_PROVIDER_SITE_OTHER): Payer: Medicare Other | Admitting: Gastroenterology

## 2014-11-02 ENCOUNTER — Encounter: Payer: Self-pay | Admitting: Gastroenterology

## 2014-11-02 VITALS — BP 157/80 | HR 90 | Temp 97.3°F | Ht 61.0 in | Wt 153.8 lb

## 2014-11-02 DIAGNOSIS — R1013 Epigastric pain: Secondary | ICD-10-CM | POA: Diagnosis not present

## 2014-11-02 DIAGNOSIS — K589 Irritable bowel syndrome without diarrhea: Secondary | ICD-10-CM

## 2014-11-02 DIAGNOSIS — R197 Diarrhea, unspecified: Secondary | ICD-10-CM | POA: Diagnosis not present

## 2014-11-02 MED ORDER — ELUXADOLINE 75 MG PO TABS: 75.0000 mg | ORAL_TABLET | Freq: Two times a day (BID) | ORAL | Status: DC

## 2014-11-02 NOTE — Assessment & Plan Note (Signed)
59 year old female with acute on chronic diarrhea for several months duration. Worse from baseline chronic diarrhea felt to be related IBS-D +/- bile acid diarrhea. Patient previously evaluated for mesenteric ischemia, microscopic colitis, IBD. Previously failed Questran, Levsin. She notes she did not get constipated with narcotic therapy at time of her surgery. May of had Anna biotic therapy during surgery but otherwise none recently. Weight is been stable. Denies nocturnal diarrhea. No blood in the stool. Abdominal pain typically resolves after bowel movement. Suspect flare of IBS D. She may be a candidate for Viberzi 5 mg twice a day but would like to rule out infectious etiology first. Will also check celiac serologies. On exam she has some epigastric pain which she describes being present for about 1 month, worse with meals. She is under a lot of stress. Likely dyspepsia versus gastritis, cannot exclude peptic ulcer disease. Await pending labs in stools. Further recommendations to follow. Cannot exclude possibility for repeat upper endoscopy at this time for epigastric pain. Hold off on PPI therapy until results are in.

## 2014-11-02 NOTE — Patient Instructions (Signed)
1. Please collect stools and drop off at lab. You will need to have labs drawn at the same time. 2. Don't start the medication (Viberzi) I gave you today until we have your results back and we can you the go ahead.

## 2014-11-02 NOTE — Progress Notes (Signed)
cc'ed to pcp °
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
## 2014-11-19 DIAGNOSIS — Z981 Arthrodesis status: Secondary | ICD-10-CM | POA: Diagnosis not present

## 2014-11-21 ENCOUNTER — Other Ambulatory Visit: Payer: Self-pay | Admitting: Gastroenterology

## 2014-11-21 DIAGNOSIS — R1013 Epigastric pain: Secondary | ICD-10-CM | POA: Diagnosis not present

## 2014-11-21 DIAGNOSIS — K589 Irritable bowel syndrome without diarrhea: Secondary | ICD-10-CM | POA: Diagnosis not present

## 2014-11-21 DIAGNOSIS — R197 Diarrhea, unspecified: Secondary | ICD-10-CM | POA: Diagnosis not present

## 2014-11-22 DIAGNOSIS — R197 Diarrhea, unspecified: Secondary | ICD-10-CM | POA: Diagnosis not present

## 2014-11-22 DIAGNOSIS — K589 Irritable bowel syndrome without diarrhea: Secondary | ICD-10-CM | POA: Diagnosis not present

## 2014-11-22 DIAGNOSIS — R1013 Epigastric pain: Secondary | ICD-10-CM | POA: Diagnosis not present

## 2014-11-22 LAB — GIARDIA/CRYPTOSPORIDIUM (EIA)
Cryptosporidium Screen (EIA): NEGATIVE
Giardia Screen (EIA): NEGATIVE

## 2014-11-22 LAB — CLOSTRIDIUM DIFFICILE BY PCR

## 2014-11-23 LAB — CBC WITH DIFFERENTIAL/PLATELET
BASOS PCT: 0 % (ref 0–1)
Basophils Absolute: 0 10*3/uL (ref 0.0–0.1)
Eosinophils Absolute: 0.2 10*3/uL (ref 0.0–0.7)
Eosinophils Relative: 3 % (ref 0–5)
HCT: 41 % (ref 36.0–46.0)
HEMOGLOBIN: 13.6 g/dL (ref 12.0–15.0)
LYMPHS PCT: 27 % (ref 12–46)
Lymphs Abs: 1.5 10*3/uL (ref 0.7–4.0)
MCH: 30.5 pg (ref 26.0–34.0)
MCHC: 33.2 g/dL (ref 30.0–36.0)
MCV: 91.9 fL (ref 78.0–100.0)
MPV: 10.7 fL (ref 8.6–12.4)
Monocytes Absolute: 0.4 10*3/uL (ref 0.1–1.0)
Monocytes Relative: 7 % (ref 3–12)
NEUTROS ABS: 3.4 10*3/uL (ref 1.7–7.7)
NEUTROS PCT: 63 % (ref 43–77)
Platelets: 167 10*3/uL (ref 150–400)
RBC: 4.46 MIL/uL (ref 3.87–5.11)
RDW: 15 % (ref 11.5–15.5)
WBC: 5.4 10*3/uL (ref 4.0–10.5)

## 2014-11-23 LAB — HEPATIC FUNCTION PANEL
ALBUMIN: 4.3 g/dL (ref 3.6–5.1)
ALT: 20 U/L (ref 6–29)
AST: 19 U/L (ref 10–35)
Alkaline Phosphatase: 92 U/L (ref 33–130)
Bilirubin, Direct: 0.1 mg/dL (ref <=0.2)
Indirect Bilirubin: 0.4 mg/dL (ref 0.2–1.2)
TOTAL PROTEIN: 6.4 g/dL (ref 6.1–8.1)
Total Bilirubin: 0.5 mg/dL (ref 0.2–1.2)

## 2014-11-23 LAB — TISSUE TRANSGLUTAMINASE, IGA: TISSUE TRANSGLUTAMINASE AB, IGA: 1 U/mL (ref <4)

## 2014-11-23 LAB — IGA: IGA: 138 mg/dL (ref 69–380)

## 2014-11-25 LAB — STOOL CULTURE

## 2014-11-26 ENCOUNTER — Telehealth: Payer: Self-pay | Admitting: Internal Medicine

## 2014-11-26 NOTE — Telephone Encounter (Signed)
[phone]  PATIENT CALLED INQUIRING ON RESULTS FROM LABS AND STOOL SAMPLE.  SOMEONE FROM HER INSURANCE IS INQUIRING.

## 2014-11-26 NOTE — Telephone Encounter (Signed)
PATIENT HUSBAND CALLED AGAIN INQUIRING ABOUT RESULTS FOR HIS WIFE.  STATES THEY ARE VERY WORRIED

## 2014-11-26 NOTE — Telephone Encounter (Signed)
Pt called and said she has been in the bed.  Her pain level has been at 9 some of the time today. She has abdominal pain in epigastric area.  She has diarrhea after eating anything almost. I told her that I will let Magda Paganini know tomorrow that they have called for results. She ate baked ribs with baked potato last night for supper.  She did say that she drinks approximately 6 cans of Pepsi daily. I told her to go to the ED if her pain worsens. We will call tomorrow with results.

## 2014-11-26 NOTE — Telephone Encounter (Signed)
Please advise  If the results are back

## 2014-11-26 NOTE — Telephone Encounter (Signed)
Pt's husband, Thalia Turkington, called back to ask about getting the results from the stool sample and the labs she had done last week. I explained to him that it would take 7-10 business days to get the results back and signed off and the nurse would be in contact with them.  They are both very anxious to know the results because her doctor told her to hold off on taking her Viberzi until they knew the results. He said that she is having diarrhea at least a dozen times a day and she can't eat anything.  I told him that a phone note had already been put in from this morning for the nurse and I would let them know that they had called back. Please advise and call [phone]

## 2014-11-26 NOTE — Telephone Encounter (Signed)
I told pt's husband that I would let Magda Paganini know that they are asking for results. I told him I didn't see anything worrisome, but I really cannot talk to him without pt's consent.  He said pt is no better. Still complains with stomach pain a lot and diarrhea all of the time, even when she eats very little.  He is also concerned that pt drinks too many pepsi's daily.  I told him to have her all me and I will try to find out more of what to tell Magda Paganini and he said he will have her call me this afternoon.

## 2014-11-26 NOTE — Telephone Encounter (Signed)
Agree with ED evaluation if worsening of symptoms.  NO MORE CARBONATED BEVERAGES. Completed avoid this.  Stay away from fatty, spicy, greasy foods (such as ribs).  Further recommendations tomorrow.

## 2014-11-26 NOTE — Telephone Encounter (Signed)
Pt is aware.  

## 2014-11-27 ENCOUNTER — Encounter: Payer: Self-pay | Admitting: Internal Medicine

## 2014-11-27 ENCOUNTER — Other Ambulatory Visit: Payer: Self-pay

## 2014-11-27 ENCOUNTER — Other Ambulatory Visit: Payer: Self-pay | Admitting: Gastroenterology

## 2014-11-27 DIAGNOSIS — R197 Diarrhea, unspecified: Secondary | ICD-10-CM

## 2014-11-27 MED ORDER — PANTOPRAZOLE SODIUM 40 MG PO TBEC: 40.0000 mg | DELAYED_RELEASE_TABLET | Freq: Every day | ORAL | Status: DC

## 2014-11-27 NOTE — Telephone Encounter (Signed)
Pt is aware ---see result note 

## 2014-11-27 NOTE — Progress Notes (Signed)
Quick Note:  Celiac screen negative.  Stools all negative EXCEPT Cdiff PCR could not be performed and recommended repeat testing if loose stool.  Please go ahead and collect another CDIFF PCR. Once she collects the stool, start Viberzi BID with food. RX given at [facility]. She also needs to start PPI for her epigastric pain. RX sent for protonix.  OV with RMR, 2-3 weeks.  If continue to have bad pain, she should go to er for evaluation. ______

## 2014-11-27 NOTE — Telephone Encounter (Signed)
Please see results note. FYI, I did not receive these messages until after I left yesterday so please apologize to patient for the delay. Thanks.

## 2014-11-27 NOTE — Telephone Encounter (Signed)
Pt is aware.  

## 2014-11-27 NOTE — Progress Notes (Signed)
APPOINTMENT MADE AND LETTER WITH STOOL SAMPLE KIT TO BE PICKED UP

## 2014-11-27 NOTE — Telephone Encounter (Signed)
Please see result note 

## 2014-11-27 NOTE — Addendum Note (Signed)
Addended by: Mahala Menghini on: 11/27/2014 01:15 PM   Modules accepted: Orders

## 2014-11-29 ENCOUNTER — Telehealth: Payer: Self-pay

## 2014-11-29 NOTE — Telephone Encounter (Signed)
[name] from [facility] called- the pt turned in her stool sample and the stool was formed and they could not run the test for cdiff.

## 2014-12-03 ENCOUNTER — Emergency Department (HOSPITAL_COMMUNITY)
Admission: EM | Admit: 2014-12-03 | Discharge: 2014-12-03 | Disposition: A | Discharge status: Home / Self Care | Payer: Medicare Other | Attending: Emergency Medicine | Admitting: Emergency Medicine

## 2014-12-03 ENCOUNTER — Encounter (HOSPITAL_COMMUNITY): Payer: Self-pay | Admitting: *Deleted

## 2014-12-03 DIAGNOSIS — J449 Chronic obstructive pulmonary disease, unspecified: Secondary | ICD-10-CM | POA: Insufficient documentation

## 2014-12-03 DIAGNOSIS — Z86010 Personal history of colonic polyps: Secondary | ICD-10-CM | POA: Insufficient documentation

## 2014-12-03 DIAGNOSIS — G8929 Other chronic pain: Secondary | ICD-10-CM | POA: Diagnosis not present

## 2014-12-03 DIAGNOSIS — K219 Gastro-esophageal reflux disease without esophagitis: Secondary | ICD-10-CM | POA: Insufficient documentation

## 2014-12-03 DIAGNOSIS — R109 Unspecified abdominal pain: Secondary | ICD-10-CM | POA: Diagnosis present

## 2014-12-03 DIAGNOSIS — R112 Nausea with vomiting, unspecified: Secondary | ICD-10-CM | POA: Diagnosis not present

## 2014-12-03 DIAGNOSIS — Z8639 Personal history of other endocrine, nutritional and metabolic disease: Secondary | ICD-10-CM | POA: Insufficient documentation

## 2014-12-03 DIAGNOSIS — Z87891 Personal history of nicotine dependence: Secondary | ICD-10-CM | POA: Diagnosis not present

## 2014-12-03 DIAGNOSIS — Z79899 Other long term (current) drug therapy: Secondary | ICD-10-CM | POA: Diagnosis not present

## 2014-12-03 DIAGNOSIS — R1013 Epigastric pain: Secondary | ICD-10-CM | POA: Insufficient documentation
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
## 2014-12-03 NOTE — Discharge Instructions (Signed)
Do not take the Viberzi. Use Maalox before meals and at bedtime. Try to stop smoking.  Abdominal Pain Many things can cause abdominal pain. Usually, abdominal pain is not caused by a disease and will improve without treatment. It can often be observed and treated at home. Your health care provider will do a physical exam and possibly order blood tests and X-rays to help determine the seriousness of your pain. However, in many cases, more time must pass before a clear cause of the pain can be found. Before that point, your health care provider may not know if you need more testing or further treatment. HOME CARE INSTRUCTIONS  Monitor your abdominal pain for any changes. The following actions may help to alleviate any discomfort you are experiencing:  Only take over-the-counter or prescription medicines as directed by your health care provider.  Do not take laxatives unless directed to do so by your health care provider.  Try a clear liquid diet (broth, tea, or water) as directed by your health care provider. Slowly move to a bland diet as tolerated. SEEK MEDICAL CARE IF:  You have unexplained abdominal pain.  You have abdominal pain associated with nausea or diarrhea.  You have pain when you urinate or have a bowel movement.  You experience abdominal pain that wakes you in the night.  You have abdominal pain that is worsened or improved by eating food.  You have abdominal pain that is worsened with eating fatty foods.  You have a fever. SEEK IMMEDIATE MEDICAL CARE IF:   Your pain does not go away within 2 hours.  You keep throwing up (vomiting).  Your pain is felt only in portions of the abdomen, such as the right side or the left lower portion of the abdomen.  You pass bloody or black tarry stools. MAKE SURE YOU:  Understand these instructions.   Will watch your condition.   Will get help right away if you are not doing well or get worse.  Document Released:  01/07/2005 Document Revised: 04/04/2013 Document Reviewed: 12/07/2012 [facility] Patient Information 2015 [place], [place]. This information is not intended to replace advice given to you by your health care provider. Make sure you discuss any questions you have with your health care provider.

## 2014-12-03 NOTE — Telephone Encounter (Signed)
Pt called and states that the Viberzi is making everything worse. States that she also is taking the protonix and nothing is helping. States she is having stomach pain and diarrhea. On a pain scale she is a 8 out 10.   Please Advise  Pt cell [phone]

## 2014-12-03 NOTE — ED Notes (Signed)
Pt states she started having new IBS medication called "Viveriz". Pt states that she started this medication 3 days ago and started having this pain. Diarrhea is present with this being her chronic issue.

## 2014-12-03 NOTE — ED Notes (Signed)
Patient c/o abd pain and diarrhea. Being treated for IBS by Dr. Oneida Alar, called office and was told to come here.

## 2014-12-03 NOTE — Telephone Encounter (Signed)
Pt came by office in pain and I told pt to proceed to ER to be evaluated for her pain being an 8 out of 10. Pt agreed and she states she is heading to [facility] ER for evaluation

## 2014-12-04 NOTE — Telephone Encounter (Signed)
ER information reviewed. Patient declined labs and CT.   Please get patient to collect the repeat Cdiff (toxin assay) that is already ordered. Stop Viberzi. Continue protonix.

## 2014-12-06 NOTE — Telephone Encounter (Signed)
Tried to call- NA 

## 2014-12-07 NOTE — Telephone Encounter (Signed)
pts husband is aware. 

## 2014-12-07 NOTE — Telephone Encounter (Signed)
Tried to call- NA 

## 2014-12-10 DIAGNOSIS — Z981 Arthrodesis status: Secondary | ICD-10-CM | POA: Diagnosis not present

## 2014-12-10 DIAGNOSIS — Z720 Tobacco use: Secondary | ICD-10-CM | POA: Diagnosis not present

## 2014-12-10 DIAGNOSIS — Z4789 Encounter for other orthopedic aftercare: Secondary | ICD-10-CM | POA: Diagnosis not present

## 2014-12-11 ENCOUNTER — Encounter: Payer: Self-pay | Admitting: Neurology

## 2014-12-11 ENCOUNTER — Ambulatory Visit (INDEPENDENT_AMBULATORY_CARE_PROVIDER_SITE_OTHER): Payer: Medicare Other | Admitting: Neurology

## 2014-12-11 ENCOUNTER — Encounter: Payer: Self-pay | Admitting: Internal Medicine

## 2014-12-11 ENCOUNTER — Ambulatory Visit (INDEPENDENT_AMBULATORY_CARE_PROVIDER_SITE_OTHER): Payer: Medicare Other | Admitting: Internal Medicine

## 2014-12-11 VITALS — BP 97/64 | HR 72 | Temp 97.7°F | Ht 61.0 in | Wt 155.2 lb

## 2014-12-11 VITALS — BP 106/58 | HR 69 | Ht 61.0 in | Wt 156.6 lb

## 2014-12-11 DIAGNOSIS — R42 Dizziness and giddiness: Secondary | ICD-10-CM | POA: Insufficient documentation

## 2014-12-11 DIAGNOSIS — R1013 Epigastric pain: Secondary | ICD-10-CM

## 2014-12-11 DIAGNOSIS — R197 Diarrhea, unspecified: Secondary | ICD-10-CM

## 2014-12-11 DIAGNOSIS — K219 Gastro-esophageal reflux disease without esophagitis: Secondary | ICD-10-CM

## 2014-12-11 DIAGNOSIS — G8929 Other chronic pain: Secondary | ICD-10-CM

## 2014-12-11 DIAGNOSIS — R269 Unspecified abnormalities of gait and mobility: Secondary | ICD-10-CM | POA: Insufficient documentation
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
## 2014-12-11 NOTE — Patient Instructions (Signed)
IBS-D / GERD information  Contine Protonix 40 mg daily  Add Imodium - one tablet twice daily  Keep a stool diary  See Dr Hilma Favors for stress / insomnia isssues  Office visit in 6 weeks

## 2014-12-11 NOTE — Patient Instructions (Signed)
I had a long discussion with the patient with regards to subarachnoid symptoms of dizziness and gait and balance difficulties which are likely due to vestibular dysfunction etiology of which is to be determined .We will check MRI scan of the brain to look for stroke or structural lesion as well as MRI of the internal auditory canal for vestibular pathology. Referred to physical therapy for vestibular rehabilitation and gait and balance training. Start taking aspirin 81 mg daily for stroke prevention and see her primary physician Dr. Hilma Favors in one week to resume her hypertension and lipid medications and check fasting lipid profile and hemoglobin A 1C. I also counseled the patient to quit smoking. She will return for follow-up in 2 months or call earlier if necessary.  Dizziness Dizziness is a common problem. It is a feeling of unsteadiness or light-headedness. You may feel like you are about to faint. Dizziness can lead to injury if you stumble or fall. A person of any age group can suffer from dizziness, but dizziness is more common in older adults. CAUSES  Dizziness can be caused by many different things, including:  Middle ear problems.  Standing for too long.  Infections.  An allergic reaction.  Aging.  An emotional response to something, such as the sight of blood.  Side effects of medicines.  Tiredness.  Problems with circulation or blood pressure.  Excessive use of alcohol or medicines, or illegal drug use.  Breathing too fast (hyperventilation).  An irregular heart rhythm (arrhythmia).  A low red blood cell count (anemia).  Pregnancy.  Vomiting, diarrhea, fever, or other illnesses that cause body fluid loss (dehydration).  Diseases or conditions such as Parkinson's disease, high blood pressure (hypertension), diabetes, and thyroid problems.  Exposure to extreme heat. DIAGNOSIS  Your health care provider will ask about your symptoms, perform a physical exam, and  perform an electrocardiogram (ECG) to record the electrical activity of your heart. Your health care provider may also perform other heart or blood tests to determine the cause of your dizziness. These may include:  Transthoracic echocardiogram (TTE). During echocardiography, sound waves are used to evaluate how blood flows through your heart.  Transesophageal echocardiogram (TEE).  Cardiac monitoring. This allows your health care provider to monitor your heart rate and rhythm in real time.  Holter monitor. This is a portable device that records your heartbeat and can help diagnose heart arrhythmias. It allows your health care provider to track your heart activity for several days if needed.  Stress tests by exercise or by giving medicine that makes the heart beat faster. TREATMENT  Treatment of dizziness depends on the cause of your symptoms and can vary greatly. HOME CARE INSTRUCTIONS   Drink enough fluids to keep your urine clear or pale yellow. This is especially important in very hot weather. In older adults, it is also important in cold weather.  Take your medicine exactly as directed if your dizziness is caused by medicines. When taking blood pressure medicines, it is especially important to get up slowly.  Rise slowly from chairs and steady yourself until you feel okay.  In the morning, first sit up on the side of the bed. When you feel okay, stand slowly while holding onto something until you know your balance is fine.  Move your legs often if you need to stand in one place for a long time. Tighten and relax your muscles in your legs while standing.  Have someone stay with you for 1-2 days if dizziness continues  to be a problem. Do this until you feel you are well enough to stay alone. Have the person call your health care provider if he or she notices changes in you that are concerning.  Do not drive or use heavy machinery if you feel dizzy.  Do not drink alcohol. SEEK  IMMEDIATE MEDICAL CARE IF:   Your dizziness or light-headedness gets worse.  You feel nauseous or vomit.  You have problems talking, walking, or using your arms, hands, or legs.  You feel weak.  You are not thinking clearly or you have trouble forming sentences. It may take a friend or family member to notice this.  You have chest pain, abdominal pain, shortness of breath, or sweating.  Your vision changes.  You notice any bleeding.  You have side effects from medicine that seems to be getting worse rather than better. MAKE SURE YOU:   Understand these instructions.  Will watch your condition.  Will get help right away if you are not doing well or get worse. Document Released: 09/23/2000 Document Revised: 04/04/2013 Document Reviewed: 10/17/2010 [facility] Patient Information 2015 [place], [place]. This information is not intended to replace advice given to you by your health care provider. Make sure you discuss any questions you have with your health care provider.
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
## 2014-12-14 DIAGNOSIS — Z1389 Encounter for screening for other disorder: Secondary | ICD-10-CM | POA: Diagnosis not present

## 2014-12-14 DIAGNOSIS — R42 Dizziness and giddiness: Secondary | ICD-10-CM | POA: Diagnosis not present

## 2014-12-14 DIAGNOSIS — R7309 Other abnormal glucose: Secondary | ICD-10-CM | POA: Diagnosis not present

## 2014-12-14 DIAGNOSIS — G4700 Insomnia, unspecified: Secondary | ICD-10-CM | POA: Diagnosis not present

## 2014-12-18 ENCOUNTER — Telehealth: Payer: Self-pay | Admitting: Internal Medicine

## 2014-12-18 DIAGNOSIS — Z1389 Encounter for screening for other disorder: Secondary | ICD-10-CM | POA: Diagnosis not present

## 2014-12-18 DIAGNOSIS — R7309 Other abnormal glucose: Secondary | ICD-10-CM | POA: Diagnosis not present

## 2014-12-18 DIAGNOSIS — Z981 Arthrodesis status: Secondary | ICD-10-CM | POA: Diagnosis not present

## 2014-12-18 DIAGNOSIS — E782 Mixed hyperlipidemia: Secondary | ICD-10-CM | POA: Diagnosis not present

## 2014-12-18 DIAGNOSIS — Z0000 Encounter for general adult medical examination without abnormal findings: Secondary | ICD-10-CM | POA: Diagnosis not present

## 2014-12-18 DIAGNOSIS — G4700 Insomnia, unspecified: Secondary | ICD-10-CM | POA: Diagnosis not present

## 2014-12-18 NOTE — Telephone Encounter (Signed)
Pts husband is aware.  °

## 2014-12-18 NOTE — Telephone Encounter (Signed)
Talked to pts husband- pt was taking imodium bid, then she cut it back to once a day. She has not had a bm in several days. He said she was very constipated and her abd felt hard to him. He wanted to know if she should stop the imodium for now and if she could take a stool softener. I advised him to stop the imodium for now and to try a stool softener and to make sure she was getting in extra fluids. Informed him that I would let RMR know and to call back if she continues to have problems. He agreed with the plan.

## 2014-12-18 NOTE — Telephone Encounter (Signed)
Impressive exaggerated response to Imodium point towards functional diarrhea.  Agree with Colace. Will hold off on Imodium the remainder of the week. As stool starts to loosen up, might try just one Imodium every other day and see how it goes but would wait until diarrhea returns.  We have not cured her diarrhea

## 2014-12-18 NOTE — Telephone Encounter (Signed)
Please call patient at [phone]. She has questions about cutting back on her Immodium. Please advise and she said if she wasn't home to talk with her husband.

## 2014-12-19 ENCOUNTER — Ambulatory Visit
Admission: RE | Admit: 2014-12-19 | Discharge: 2014-12-19 | Disposition: A | Discharge status: Home / Self Care | Payer: Medicare Other | Source: Ambulatory Visit | Attending: Neurology | Admitting: Neurology

## 2014-12-19 DIAGNOSIS — R42 Dizziness and giddiness: Secondary | ICD-10-CM

## 2014-12-19 DIAGNOSIS — R269 Unspecified abnormalities of gait and mobility: Secondary | ICD-10-CM

## 2014-12-22 ENCOUNTER — Ambulatory Visit
Admission: RE | Admit: 2014-12-22 | Discharge: 2014-12-22 | Disposition: A | Discharge status: Home / Self Care | Payer: Medicare Other | Source: Ambulatory Visit | Attending: Neurology | Admitting: Neurology

## 2014-12-22 DIAGNOSIS — M5136 Other intervertebral disc degeneration, lumbar region: Secondary | ICD-10-CM | POA: Diagnosis not present

## 2014-12-22 DIAGNOSIS — R42 Dizziness and giddiness: Secondary | ICD-10-CM | POA: Diagnosis not present

## 2014-12-22 DIAGNOSIS — M961 Postlaminectomy syndrome, not elsewhere classified: Secondary | ICD-10-CM | POA: Diagnosis not present

## 2014-12-22 DIAGNOSIS — R269 Unspecified abnormalities of gait and mobility: Secondary | ICD-10-CM | POA: Diagnosis not present

## 2014-12-22 MED ORDER — GADOBENATE DIMEGLUMINE 529 MG/ML IV SOLN
7.0000 mL | Freq: Once | INTRAVENOUS | Status: AC | PRN
  Administered 2014-12-22: 7 mL via INTRAVENOUS

## 2014-12-24 ENCOUNTER — Telehealth: Payer: Self-pay | Admitting: Neurology

## 2014-12-24 NOTE — Telephone Encounter (Signed)
Patients husband calling about wife MRI. Pts husband stated his wife has been unsteady and he feels she is getting worse. Pts husband stated that he wants the results of the MRI. I see the MRI is abnormal. He stated she has been this wife for the last couple of weeks. Pt was last seen 12-11-14 in the office. Rn will forward this message to [place].

## 2014-12-24 NOTE — Telephone Encounter (Signed)
I spoke to husband and gave MRI brain results showing no change from prior mRi and need to keep appointment with vestibular rehab for dizziness exercises

## 2014-12-24 NOTE — Telephone Encounter (Signed)
Patient's husband called requesting MRI results. They are very worried. Her symptoms are getting worse by the day. He states they are going to stay at home until they receive a phone call with the results as they are worried. Please call and advise. He can be reached at [phone].

## 2014-12-24 NOTE — Telephone Encounter (Signed)
Patient's husband called back about MRI results.

## 2014-12-24 NOTE — Telephone Encounter (Signed)
Husband called back about MRI results. He was assured on Saturday that we would have the MRI results first thing this morning. He has called this morning and still hasn't gotten a call back. Husband feels that wife is getting worse within the last couple of days. They are very concerned. Please call [phone].

## 2015-01-08 ENCOUNTER — Ambulatory Visit (HOSPITAL_COMMUNITY): Payer: Medicare Other | Attending: Neurology

## 2015-01-08 ENCOUNTER — Encounter (HOSPITAL_COMMUNITY): Payer: Self-pay

## 2015-01-08 DIAGNOSIS — R296 Repeated falls: Secondary | ICD-10-CM | POA: Insufficient documentation

## 2015-01-08 DIAGNOSIS — Z7409 Other reduced mobility: Secondary | ICD-10-CM | POA: Insufficient documentation

## 2015-01-08 DIAGNOSIS — R269 Unspecified abnormalities of gait and mobility: Secondary | ICD-10-CM | POA: Diagnosis not present

## 2015-01-08 DIAGNOSIS — R262 Difficulty in walking, not elsewhere classified: Secondary | ICD-10-CM | POA: Insufficient documentation

## 2015-01-08 NOTE — Patient Instructions (Signed)
  Lying down alterate these two positions in a pain free range. Perform 20x3 times daily. Should not provoke any pain.
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
## 2015-01-11 ENCOUNTER — Ambulatory Visit (HOSPITAL_COMMUNITY): Payer: Medicare Other

## 2015-01-11 DIAGNOSIS — R296 Repeated falls: Secondary | ICD-10-CM

## 2015-01-11 DIAGNOSIS — R269 Unspecified abnormalities of gait and mobility: Secondary | ICD-10-CM

## 2015-01-11 DIAGNOSIS — R262 Difficulty in walking, not elsewhere classified: Secondary | ICD-10-CM | POA: Diagnosis not present

## 2015-01-11 DIAGNOSIS — Z7409 Other reduced mobility: Secondary | ICD-10-CM | POA: Diagnosis not present
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
## 2015-01-15 ENCOUNTER — Ambulatory Visit (HOSPITAL_COMMUNITY): Payer: Medicare Other | Attending: Neurology

## 2015-01-15 DIAGNOSIS — R262 Difficulty in walking, not elsewhere classified: Secondary | ICD-10-CM | POA: Insufficient documentation

## 2015-01-15 DIAGNOSIS — Z7409 Other reduced mobility: Secondary | ICD-10-CM | POA: Insufficient documentation

## 2015-01-15 DIAGNOSIS — R269 Unspecified abnormalities of gait and mobility: Secondary | ICD-10-CM | POA: Diagnosis not present

## 2015-01-15 DIAGNOSIS — R296 Repeated falls: Secondary | ICD-10-CM | POA: Diagnosis not present
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
## 2015-01-17 ENCOUNTER — Ambulatory Visit (HOSPITAL_COMMUNITY): Payer: Medicare Other

## 2015-01-22 ENCOUNTER — Ambulatory Visit (HOSPITAL_COMMUNITY): Payer: Medicare Other

## 2015-01-22 DIAGNOSIS — R262 Difficulty in walking, not elsewhere classified: Secondary | ICD-10-CM | POA: Diagnosis not present

## 2015-01-22 DIAGNOSIS — Z7409 Other reduced mobility: Secondary | ICD-10-CM

## 2015-01-22 DIAGNOSIS — R296 Repeated falls: Secondary | ICD-10-CM | POA: Diagnosis not present

## 2015-01-22 DIAGNOSIS — R269 Unspecified abnormalities of gait and mobility: Secondary | ICD-10-CM | POA: Diagnosis not present

## 2015-01-22 NOTE — Patient Instructions (Signed)
Hamstring Stretch    With other leg bent, foot flat, grasp right leg and slowly try to straighten knee. Hold 30 seconds. Repeat 3  times. Do 1-2 sessions per day.  http://gt2.exer.us/280   Copyright  VHI. All rights reserved.   Knee / [name] on stomach, knees together. Grab one ankle with same side hand. Use towel if needed to reach. Gently pull foot toward buttock. Hold 30 seconds. Repeat with other leg. Repeat 3 times. Do 1-2 sessions per day.  Copyright  VHI. All rights reserved.
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
## 2015-01-24 ENCOUNTER — Ambulatory Visit (INDEPENDENT_AMBULATORY_CARE_PROVIDER_SITE_OTHER): Payer: Medicare Other | Admitting: Gastroenterology

## 2015-01-24 ENCOUNTER — Other Ambulatory Visit: Payer: Self-pay

## 2015-01-24 ENCOUNTER — Encounter: Payer: Self-pay | Admitting: Gastroenterology

## 2015-01-24 ENCOUNTER — Ambulatory Visit (HOSPITAL_COMMUNITY): Payer: Medicare Other | Admitting: Physical Therapy

## 2015-01-24 VITALS — BP 153/82 | HR 72 | Temp 97.1°F | Ht 61.0 in | Wt 148.2 lb

## 2015-01-24 DIAGNOSIS — R296 Repeated falls: Secondary | ICD-10-CM

## 2015-01-24 DIAGNOSIS — R1013 Epigastric pain: Secondary | ICD-10-CM

## 2015-01-24 DIAGNOSIS — Z7409 Other reduced mobility: Secondary | ICD-10-CM

## 2015-01-24 DIAGNOSIS — R630 Anorexia: Secondary | ICD-10-CM | POA: Diagnosis not present

## 2015-01-24 DIAGNOSIS — R269 Unspecified abnormalities of gait and mobility: Secondary | ICD-10-CM | POA: Diagnosis not present

## 2015-01-24 DIAGNOSIS — R197 Diarrhea, unspecified: Secondary | ICD-10-CM

## 2015-01-24 DIAGNOSIS — K580 Irritable bowel syndrome with diarrhea: Secondary | ICD-10-CM | POA: Diagnosis not present

## 2015-01-24 DIAGNOSIS — G8929 Other chronic pain: Secondary | ICD-10-CM

## 2015-01-24 DIAGNOSIS — R262 Difficulty in walking, not elsewhere classified: Secondary | ICD-10-CM | POA: Diagnosis not present

## 2015-01-24 MED ORDER — DEXLANSOPRAZOLE 60 MG PO CPDR: 60.0000 mg | DELAYED_RELEASE_CAPSULE | Freq: Every day | ORAL | Status: DC

## 2015-01-24 NOTE — Progress Notes (Signed)
CC'ED TO PCP 

## 2015-01-24 NOTE — Patient Instructions (Signed)
1. Upper endoscopy with Dr. Gala Romney. See separate instructions.  2. Dexilant one capsule 30 minutes before first food of the day. Samples provided.

## 2015-01-24 NOTE — Assessment & Plan Note (Signed)
59 y/o with acute on chronic abdominal pain. Postprandial in epigastric region associated with early satiety, poor appetite. H/o gastritis on EGD 3 years ago. Recently started daily ASA regimen under direction of her doctor. Also with baseline chronic diarrhea thought to be do to IBS-D based on previous extensive evaluation. Recently did not tolerate Viberzi or Imodium. Ongoing diarrhea with significant dietary changes.   Discussed at length with patient. Given significant epigastric pain on exam and ongoing symptoms, PUD needs to be considered. Plan on EGD with Dr. Gala Romney, phenergan 25mg  IV 30 minutes before to augment conscious sedation.  I have discussed the risks, alternatives, benefits with regards to but not limited to the risk of reaction to medication, bleeding, infection, perforation and the patient is agreeable to proceed. Written consent to be obtained.  With regards to chronic postprandial abd pain, diarrhea. Consider formal CTA abd to look at vasculature after EGD complete if unremarkable. Could also consider trial of Xifaxan.   Start PPI, patient reports having NO drug coverage. Dexilant samples provided.
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
## 2015-01-25 ENCOUNTER — Encounter (HOSPITAL_COMMUNITY): Payer: Self-pay

## 2015-01-29 ENCOUNTER — Ambulatory Visit (HOSPITAL_COMMUNITY): Payer: Medicare Other

## 2015-01-29 ENCOUNTER — Other Ambulatory Visit: Payer: Self-pay

## 2015-01-29 ENCOUNTER — Ambulatory Visit (HOSPITAL_COMMUNITY)
Admission: RE | Admit: 2015-01-29 | Discharge: 2015-01-29 | Disposition: A | Discharge status: Home / Self Care | Payer: Medicare Other | Source: Ambulatory Visit | Attending: Internal Medicine | Admitting: Internal Medicine

## 2015-01-29 ENCOUNTER — Encounter (HOSPITAL_COMMUNITY)
Admission: RE | Disposition: A | Discharge status: Home / Self Care | Payer: Self-pay | Source: Ambulatory Visit | Attending: Internal Medicine

## 2015-01-29 ENCOUNTER — Encounter (HOSPITAL_COMMUNITY): Payer: Self-pay | Admitting: *Deleted

## 2015-01-29 DIAGNOSIS — F419 Anxiety disorder, unspecified: Secondary | ICD-10-CM | POA: Insufficient documentation

## 2015-01-29 DIAGNOSIS — J449 Chronic obstructive pulmonary disease, unspecified: Secondary | ICD-10-CM | POA: Diagnosis not present

## 2015-01-29 DIAGNOSIS — R1013 Epigastric pain: Secondary | ICD-10-CM | POA: Insufficient documentation

## 2015-01-29 DIAGNOSIS — K219 Gastro-esophageal reflux disease without esophagitis: Secondary | ICD-10-CM | POA: Diagnosis not present

## 2015-01-29 DIAGNOSIS — Z7982 Long term (current) use of aspirin: Secondary | ICD-10-CM | POA: Insufficient documentation

## 2015-01-29 DIAGNOSIS — G8929 Other chronic pain: Secondary | ICD-10-CM

## 2015-01-29 DIAGNOSIS — I10 Essential (primary) hypertension: Secondary | ICD-10-CM | POA: Insufficient documentation

## 2015-01-29 DIAGNOSIS — R634 Abnormal weight loss: Secondary | ICD-10-CM

## 2015-01-29 DIAGNOSIS — E785 Hyperlipidemia, unspecified: Secondary | ICD-10-CM | POA: Insufficient documentation

## 2015-01-29 DIAGNOSIS — Z87891 Personal history of nicotine dependence: Secondary | ICD-10-CM | POA: Insufficient documentation

## 2015-01-29 DIAGNOSIS — R109 Unspecified abdominal pain: Secondary | ICD-10-CM

## 2015-01-29 DIAGNOSIS — Z79899 Other long term (current) drug therapy: Secondary | ICD-10-CM | POA: Diagnosis not present

## 2015-01-29 HISTORY — PX: ESOPHAGOGASTRODUODENOSCOPY: SHX5428

## 2015-01-29 SURGERY — EGD (ESOPHAGOGASTRODUODENOSCOPY)
Anesthesia: Moderate Sedation

## 2015-01-29 MED ORDER — ONDANSETRON HCL 4 MG/2ML IJ SOLN
INTRAMUSCULAR | Status: DC | PRN
  Administered 2015-01-29: 4 mg via INTRAVENOUS

## 2015-01-29 MED ORDER — PROMETHAZINE HCL 25 MG/ML IJ SOLN
25.0000 mg | Freq: Once | INTRAMUSCULAR | Status: AC
  Administered 2015-01-29: 25 mg via INTRAVENOUS

## 2015-01-29 MED ORDER — LIDOCAINE VISCOUS 2 % MT SOLN
OROMUCOSAL | Status: AC
  Filled 2015-01-29: qty 15

## 2015-01-29 MED ORDER — MIDAZOLAM HCL 5 MG/5ML IJ SOLN
INTRAMUSCULAR | Status: AC
  Filled 2015-01-29: qty 10

## 2015-01-29 MED ORDER — SODIUM CHLORIDE 0.9 % IJ SOLN
INTRAMUSCULAR | Status: AC
  Filled 2015-01-29: qty 3

## 2015-01-29 MED ORDER — ONDANSETRON HCL 4 MG/2ML IJ SOLN
INTRAMUSCULAR | Status: AC
  Filled 2015-01-29: qty 2

## 2015-01-29 MED ORDER — STERILE WATER FOR IRRIGATION IR SOLN
Status: DC | PRN
  Administered 2015-01-29: 08:00:00

## 2015-01-29 MED ORDER — SODIUM CHLORIDE 0.9 % IV SOLN
INTRAVENOUS | Status: DC
  Administered 2015-01-29: 08:00:00 via INTRAVENOUS

## 2015-01-29 MED ORDER — MEPERIDINE HCL 100 MG/ML IJ SOLN
INTRAMUSCULAR | Status: AC
  Filled 2015-01-29: qty 2

## 2015-01-29 MED ORDER — LIDOCAINE VISCOUS 2 % MT SOLN
OROMUCOSAL | Status: DC | PRN
  Administered 2015-01-29: 4 mL via OROMUCOSAL

## 2015-01-29 MED ORDER — PROMETHAZINE HCL 25 MG/ML IJ SOLN
INTRAMUSCULAR | Status: AC
  Filled 2015-01-29: qty 1

## 2015-01-29 MED ORDER — MEPERIDINE HCL 100 MG/ML IJ SOLN
INTRAMUSCULAR | Status: DC | PRN
Start: 2015-01-29 — End: 2015-01-29
  Administered 2015-01-29 (×2): 50 mg via INTRAVENOUS

## 2015-01-29 MED ORDER — MIDAZOLAM HCL 5 MG/5ML IJ SOLN
INTRAMUSCULAR | Status: DC | PRN
  Administered 2015-01-29 (×2): 2 mg via INTRAVENOUS

## 2015-01-29 NOTE — Progress Notes (Signed)
Dr Gala Romney made aware of patient's blood pressure of 74/48.  Verbal order to infuse bolus of 1000cc of  0.9 NS and monitor patient.  Pt with no complaints; resting with eyes closed; replies when questioned appropriately.  Skin pink, warm and dry.
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
## 2015-01-29 NOTE — Discharge Instructions (Signed)
EGD Discharge instructions Please read the instructions outlined below and refer to this sheet in the next few weeks. These discharge instructions provide you with general information on caring for yourself after you leave the hospital. Your doctor may also give you specific instructions. While your treatment has been planned according to the most current medical practices available, unavoidable complications occasionally occur. If you have any problems or questions after discharge, please call your doctor. ACTIVITY  You may resume your regular activity but move at a slower pace for the next 24 hours.   Take frequent rest periods for the next 24 hours.   Walking will help expel (get rid of) the air and reduce the bloated feeling in your abdomen.   No driving for 24 hours (because of the anesthesia (medicine) used during the test).   You may shower.   Do not sign any important legal documents or operate any machinery for 24 hours (because of the anesthesia used during the test).  NUTRITION  Drink plenty of fluids.   You may resume your normal diet.   Begin with a light meal and progress to your normal diet.   Avoid alcoholic beverages for 24 hours or as instructed by your caregiver.  MEDICATIONS  You may resume your normal medications unless your caregiver tells you otherwise.  WHAT YOU CAN EXPECT TODAY  You may experience abdominal discomfort such as a feeling of fullness or gas pains.  FOLLOW-UP  Your doctor will discuss the results of your test with you.  SEEK IMMEDIATE MEDICAL ATTENTION IF ANY OF THE FOLLOWING OCCUR:  Excessive nausea (feeling sick to your stomach) and/or vomiting.   Severe abdominal pain and distention (swelling).   Trouble swallowing.   Temperature over 101 F (37.8 C).   Rectal bleeding or vomiting of blood.     We'll schedule a CAT scan angiogram of your abdomen and pelvis (sitophobia, postprandial abdominal pain and weight loss) to further  evaluate the cause of your abdominal pain as your upper endoscopy today was normal.  Further recommendations to follow.

## 2015-01-29 NOTE — Progress Notes (Signed)
Wasted 1mg  of versed

## 2015-01-29 NOTE — Op Note (Signed)
[facility] [address] [place], [address]   ENDOSCOPY PROCEDURE REPORT  PATIENT: [name], Huante  MR#: #[id number] BIRTHDATE: [birth year] , 58  yrs. old GENDER: female ENDOSCOPIST: R.  Garfield Cornea, MD FACP FACG REFERRED BY:  Sharilyn Sites, M.D. PROCEDURE DATE:  Feb 15, 2015 PROCEDURE:  EGD, diagnostic INDICATIONS:  Postprandial abdominal pain, weight loss/sitophobia. MEDICATIONS: Versed 4 mg IV and Demerol 100 mg IV in divided doses. Phenergan 25 mg IV.  Zofran 4 mg IV.  Xylocaine gel orally. ASA CLASS:      Class II  CONSENT: The risks, benefits, limitations, alternatives and imponderables have been discussed.  The potential for biopsy, esophogeal dilation, etc. have also been reviewed.  Questions have been answered.  All parties agreeable.  Please see the history and physical in the medical record for more information.  DESCRIPTION OF PROCEDURE: After the risks benefits and alternatives of the procedure were thoroughly explained, informed consent was obtained.  The EG-[id number] ([id number]) endoscope was introduced through the mouth and advanced to the second portion of the duodenum , limited by Without limitations. The instrument was slowly withdrawn as the mucosa was fully examined. Estimated blood loss is zero unless otherwise noted in this procedure report.    Normal-appearing tubular esophagus.  Stomach empty. Normal-appearing gastric mucosa.  Patent pylorus.  Normal appearing first, second third portion of the duodenum.  Retroflexed views revealed no abnormalities.     The scope was then withdrawn from the patient and the procedure completed.  COMPLICATIONS: There were no immediate complications.  ENDOSCOPIC IMPRESSION: Normal EGD.  RECOMMENDATIONS: Proceed with a CAT scan angiogram of the abdomen and pelvis to evaluate symptoms further for potential underlying mesenteric ischemia  REPEAT EXAM:  eSigned:  R. Garfield Cornea, MD Rosalita Chessman [facility]  02/15/2015 8:56 AM    CC:  CPT CODES: ICD CODES:  The ICD and CPT codes recommended by this software are interpretations from the data that the clinical staff has captured with the software.  The verification of the translation of this report to the ICD and CPT codes and modifiers is the sole responsibility of the health care institution and practicing physician where this report was generated.  [employer] will not be held responsible for the validity of the ICD and CPT codes included on this report.  AMA assumes no liability for data contained or not contained herein. CPT is a Designer, television/film set of the [employer].  PATIENT NAME:  Kaitlyn, Patterson MR#: #[id number]

## 2015-01-29 NOTE — Interval H&P Note (Signed)
History and Physical Interval Note:  01/29/2015 8:22 AM  [name]  has presented today for surgery, with the diagnosis of epig pain  The various methods of treatment have been discussed with the patient and family. After consideration of risks, benefits and other options for treatment, the patient has consented to  Procedure(s) with comments: ESOPHAGOGASTRODUODENOSCOPY (EGD) (N/A) - 815 as a surgical intervention .  The patient's history has been reviewed, patient examined, no change in status, stable for surgery.  I have reviewed the patient's chart and labs.  Questions were answered to the patient's satisfaction.     Nyjah Schwake  No change. Diagnostic EGD per plan.The risks, benefits, limitations, alternatives and imponderables have been reviewed with the patient. Potential for esophageal dilation, biopsy, etc. have also been reviewed.  Questions have been answered. All parties agreeable.

## 2015-01-31 ENCOUNTER — Telehealth: Payer: Self-pay

## 2015-01-31 ENCOUNTER — Ambulatory Visit (HOSPITAL_COMMUNITY): Payer: Medicare Other | Admitting: Physical Therapy

## 2015-01-31 NOTE — Telephone Encounter (Signed)
Pt's husband(Joe) called this morning.He said that her stomach is still hurting and hard as a rock and going into her back.She is not eating well. She is having bowel movements all the time. Her pain level is a 7. Her husband gave her one of his pain pills( Hydrocodone 10-325) and it helped a little while. He is wanting to know if he can keep giving her some of the pain pills.She is set up for her CT Angio for Friday morning. Please advise

## 2015-01-31 NOTE — Telephone Encounter (Signed)
Pt's husband called back to see if RMR had said anything yet. I told him not yet. He also said that she was hurting in her chest now and having some numbness. I told him that she needed to go to the ER to get checked out. He stated that he did not have a way to take the wheel chair with them to the ER. I advised him that she needed to go to the ER still even if he had to call EMS. He said that he would talk to her and see what she wanted to do.

## 2015-02-01 ENCOUNTER — Ambulatory Visit (HOSPITAL_COMMUNITY)
Admission: RE | Admit: 2015-02-01 | Discharge: 2015-02-01 | Disposition: A | Discharge status: Home / Self Care | Payer: Medicare Other | Source: Ambulatory Visit | Attending: Internal Medicine | Admitting: Internal Medicine

## 2015-02-01 DIAGNOSIS — R109 Unspecified abdominal pain: Secondary | ICD-10-CM | POA: Diagnosis not present

## 2015-02-01 DIAGNOSIS — K5730 Diverticulosis of large intestine without perforation or abscess without bleeding: Secondary | ICD-10-CM | POA: Diagnosis not present

## 2015-02-01 DIAGNOSIS — I708 Atherosclerosis of other arteries: Secondary | ICD-10-CM | POA: Insufficient documentation

## 2015-02-01 DIAGNOSIS — R634 Abnormal weight loss: Secondary | ICD-10-CM | POA: Insufficient documentation

## 2015-02-01 LAB — POCT I-STAT CREATININE: CREATININE: 0.8 mg/dL (ref 0.44–1.00)

## 2015-02-01 MED ORDER — IOHEXOL 350 MG/ML SOLN
100.0000 mL | Freq: Once | INTRAVENOUS | Status: AC | PRN
  Administered 2015-02-01: 100 mL via INTRAVENOUS

## 2015-02-05 ENCOUNTER — Ambulatory Visit (HOSPITAL_COMMUNITY): Payer: Medicare Other | Admitting: Physical Therapy

## 2015-02-05 DIAGNOSIS — R269 Unspecified abnormalities of gait and mobility: Secondary | ICD-10-CM

## 2015-02-05 DIAGNOSIS — R296 Repeated falls: Secondary | ICD-10-CM | POA: Diagnosis not present

## 2015-02-05 DIAGNOSIS — Z7409 Other reduced mobility: Secondary | ICD-10-CM

## 2015-02-05 DIAGNOSIS — R262 Difficulty in walking, not elsewhere classified: Secondary | ICD-10-CM | POA: Diagnosis not present
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
## 2015-02-07 ENCOUNTER — Encounter (HOSPITAL_COMMUNITY): Payer: Self-pay | Admitting: Internal Medicine

## 2015-02-07 ENCOUNTER — Encounter (HOSPITAL_COMMUNITY): Payer: Self-pay

## 2015-02-12 ENCOUNTER — Encounter (HOSPITAL_COMMUNITY): Payer: Self-pay

## 2015-02-14 ENCOUNTER — Encounter (HOSPITAL_COMMUNITY): Payer: Self-pay

## 2015-02-15 NOTE — Progress Notes (Signed)
PT HAS FU OV 02/21/15

## 2015-02-19 ENCOUNTER — Encounter (HOSPITAL_COMMUNITY): Payer: Self-pay

## 2015-02-19 ENCOUNTER — Ambulatory Visit: Payer: Self-pay | Admitting: Neurology

## 2015-02-20 ENCOUNTER — Encounter: Payer: Self-pay | Admitting: Neurology

## 2015-02-21 ENCOUNTER — Ambulatory Visit (INDEPENDENT_AMBULATORY_CARE_PROVIDER_SITE_OTHER): Payer: Medicare Other | Admitting: Gastroenterology

## 2015-02-21 ENCOUNTER — Encounter (HOSPITAL_COMMUNITY): Payer: Self-pay

## 2015-02-21 ENCOUNTER — Other Ambulatory Visit: Payer: Self-pay

## 2015-02-21 ENCOUNTER — Encounter: Payer: Self-pay | Admitting: Gastroenterology

## 2015-02-21 VITALS — BP 134/80 | HR 69 | Temp 97.4°F | Ht 61.0 in | Wt 147.0 lb

## 2015-02-21 DIAGNOSIS — R109 Unspecified abdominal pain: Secondary | ICD-10-CM

## 2015-02-21 DIAGNOSIS — I739 Peripheral vascular disease, unspecified: Secondary | ICD-10-CM

## 2015-02-21 DIAGNOSIS — R1013 Epigastric pain: Secondary | ICD-10-CM

## 2015-02-21 NOTE — Patient Instructions (Signed)
We have referred you to Dr. Tinnie Gens with Vascular Surgery.  We are arranging a bacterial overgrowth test.   If this is normal, I will refer you to [facility] for a second opinion.
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
## 2015-02-24 ENCOUNTER — Encounter: Payer: Self-pay | Admitting: Gastroenterology

## 2015-02-26 ENCOUNTER — Encounter (HOSPITAL_COMMUNITY)
Admission: RE | Disposition: A | Discharge status: Home / Self Care | Payer: Self-pay | Source: Ambulatory Visit | Attending: Internal Medicine

## 2015-02-26 ENCOUNTER — Encounter (HOSPITAL_COMMUNITY): Payer: Self-pay | Admitting: *Deleted

## 2015-02-26 ENCOUNTER — Encounter (HOSPITAL_COMMUNITY): Payer: Self-pay

## 2015-02-26 ENCOUNTER — Ambulatory Visit (HOSPITAL_COMMUNITY)
Admission: RE | Admit: 2015-02-26 | Discharge: 2015-02-26 | Disposition: A | Discharge status: Home / Self Care | Payer: Medicare Other | Source: Ambulatory Visit | Attending: Internal Medicine | Admitting: Internal Medicine

## 2015-02-26 DIAGNOSIS — R197 Diarrhea, unspecified: Secondary | ICD-10-CM | POA: Diagnosis not present

## 2015-02-26 DIAGNOSIS — R109 Unspecified abdominal pain: Secondary | ICD-10-CM

## 2015-02-26 HISTORY — PX: BACTERIAL OVERGROWTH TEST: SHX5739

## 2015-02-26 SURGERY — BREATH TEST, FOR INTESTINAL BACTERIAL OVERGROWTH

## 2015-02-26 MED ORDER — LACTULOSE 10 GM/15ML PO SOLN
ORAL | Status: AC
  Filled 2015-02-26: qty 60

## 2015-02-26 MED ORDER — LACTULOSE 10 GM/15ML PO SOLN
25.0000 g | Freq: Once | ORAL | Status: AC
  Administered 2015-02-26: 25 g via ORAL

## 2015-02-26 MED ORDER — SODIUM CHLORIDE 0.9 % IV SOLN: INTRAVENOUS | Status: DC

## 2015-02-26 NOTE — Progress Notes (Signed)
No beans, bran or high fiber cereal the day before the procedure? NO NPO except for water 12 hours before procedure? NPO since 2100-Water No smoking, sleeping or vigorous exercising for at least 30 before procedure? NO Recent antibiotic use and/or diarrhea? No antibiotics since May 2016, last episode of diarrhea yesterday.    If yes, physician notified.  Time Baseline 15 mins 30 mins 45 mins 60 mins 75 mins 90 mins 105 mins 120 mins 135 mins 150 mins 165 mins 180 mins  H2-ppm 001 005 009 [id number]

## 2015-02-27 ENCOUNTER — Other Ambulatory Visit: Payer: Self-pay | Admitting: Gastroenterology

## 2015-02-27 ENCOUNTER — Telehealth: Payer: Self-pay | Admitting: Gastroenterology

## 2015-02-27 MED ORDER — AMOXICILLIN-POT CLAVULANATE 500-125 MG PO TABS: 1.0000 | ORAL_TABLET | Freq: Three times a day (TID) | ORAL | Status: DC

## 2015-02-27 NOTE — Telephone Encounter (Signed)
HBT reviewed. Probable bacterial overgrowth. I'm not convinced this explains all of her symptoms. I sent in Augmentin to take TID for 7 days. Call in 2-4 weeks with progress update. If she is not better, will consider tertiary referral due to abdominal pain, weight loss.

## 2015-02-27 NOTE — Assessment & Plan Note (Signed)
59 year old female with chronic abdominal pain, IBS-D, recent weight loss with unrevealing work-up thus far to include EGD, CTA without mesenteric ischemia. Weight loss multifactorial likely. With persistent abdominal discomfort, bloating, history of chronic diarrhea, will proceed with hydrogen breath test. Consider tertiary referral due to persistent weight loss despite negative work-up thus far. As of note, widespread PAD on CTA. Refer back to Dr. Tinnie Gens, as patient has seen him in the past. Further recommendations to follow.

## 2015-02-27 NOTE — Procedures (Signed)
Patient: [name] DOB: [birth year]   Pre-operative diagnosis: evaluate for SIBO  Post-operative diagnosis: Probable small bowel bacterial overgrowth  Procedure: Hydrogen Breath Test Medication used: lactulose  Findings: Time Baseline 15 mins 30 mins 45 mins 60 mins 75 mins 90 mins 105 mins 120 mins 135 mins 150 mins 165 mins 180 mins  H2-ppm 001 005 009 017 008 014 022 023 036  36  30  037 33       Probable small bowel bacterial overgrowth as evidenced by greater than 20ppm absolute increase at 90 minutes from baseline reading.   Diagnosis: Probable small bowel bacterial overgrowth.   Plan of Care:  1. Augmentin 500 mg TID for 7 days 2. Progress report in 2-4 weeks. If no improvement, referral to [facility] for second opinion.

## 2015-02-28 ENCOUNTER — Telehealth: Payer: Self-pay | Admitting: Internal Medicine

## 2015-02-28 ENCOUNTER — Encounter (HOSPITAL_COMMUNITY): Payer: Self-pay

## 2015-02-28 NOTE — Telephone Encounter (Signed)
Pt is aware.  

## 2015-02-28 NOTE — Telephone Encounter (Signed)
patient calling for results,asking why is she on antibiotics and she said she was suppose to be referred to Dr Tinnie Gens. If she isn't home said she we could speak to [name] (husband)  [phone]

## 2015-02-28 NOTE — Telephone Encounter (Signed)
Talked with the office today and they are going to call her to schedule her appointment

## 2015-02-28 NOTE — Telephone Encounter (Signed)
I spoke with the pt about her results. I informed her that I could see where the referral to [facility] was sent in Epic but I wasn't sure if Candy or Ginger had heard anything from their office yet. Can you check on the referral?

## 2015-02-28 NOTE — Telephone Encounter (Signed)
I have called and left message with them to check on the referral

## 2015-03-04 ENCOUNTER — Encounter (HOSPITAL_COMMUNITY): Payer: Self-pay | Admitting: Internal Medicine

## 2015-03-04 NOTE — Progress Notes (Signed)
cc'd to pcp 

## 2015-03-05 ENCOUNTER — Encounter: Payer: Self-pay | Admitting: Vascular Surgery

## 2015-03-06 ENCOUNTER — Other Ambulatory Visit: Payer: Self-pay | Admitting: Gastroenterology

## 2015-03-06 ENCOUNTER — Other Ambulatory Visit: Payer: Self-pay

## 2015-03-06 DIAGNOSIS — R197 Diarrhea, unspecified: Secondary | ICD-10-CM

## 2015-03-06 NOTE — Telephone Encounter (Signed)
pts ov is Tuesday 03/12/15 with Dr.Yuchen Fedor

## 2015-03-06 NOTE — Telephone Encounter (Signed)
Pt is still having diarrhea. Lab orders done and pt will go by the lab and get container to turn in. She will go to the ED over the holiday if she worsens.  Please refer pt to [facility].

## 2015-03-06 NOTE — Telephone Encounter (Signed)
Pt called- she said she is on her last day of abx and she is not feeling any better. She is still having the pain and nausea. She is not vomiting. No fever. She is watching diet to make sure she doesn't eat anything greasy and she is not eating a lot of breads or "junk food". She said the abx hasnt helped at all. She is loosing weight.   She wants to know if there is anything she can do?

## 2015-03-06 NOTE — Telephone Encounter (Signed)
If she is still having diarrhea, let's check a fecal elastase stool test. This is definitely thinking outside the box, but if she DOES have pancreatic insufficiency, would start on pancreatic enzymes but still refer to [facility]. Let's go ahead and get the ball rolling to refer to [facility] for a second opinion. In meantime, let's get fecal elastase.

## 2015-03-11 ENCOUNTER — Other Ambulatory Visit: Payer: Self-pay

## 2015-03-11 DIAGNOSIS — R197 Diarrhea, unspecified: Secondary | ICD-10-CM

## 2015-03-11 NOTE — Telephone Encounter (Signed)
Referral made pt has appt on 04/02/2015 with Dr. Lynita Lombard at [facility]

## 2015-03-12 ENCOUNTER — Ambulatory Visit (INDEPENDENT_AMBULATORY_CARE_PROVIDER_SITE_OTHER): Payer: Medicare Other | Admitting: Vascular Surgery

## 2015-03-12 ENCOUNTER — Encounter: Payer: Self-pay | Admitting: Vascular Surgery

## 2015-03-12 VITALS — BP 118/62 | HR 55 | Temp 97.6°F | Resp 16 | Ht 61.0 in | Wt 144.0 lb

## 2015-03-12 DIAGNOSIS — I739 Peripheral vascular disease, unspecified: Secondary | ICD-10-CM | POA: Diagnosis not present

## 2015-03-12 DIAGNOSIS — R197 Diarrhea, unspecified: Secondary | ICD-10-CM | POA: Diagnosis not present
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
## 2015-03-14 ENCOUNTER — Telehealth: Payer: Self-pay | Admitting: Internal Medicine

## 2015-03-14 NOTE — Telephone Encounter (Signed)
Routing to AS. Looks like pancreatic elastase is still pending.

## 2015-03-14 NOTE — Telephone Encounter (Signed)
Patient called inquiring on lab results     [phone]

## 2015-03-15 NOTE — Telephone Encounter (Signed)
pts husband is aware. 

## 2015-03-15 NOTE — Telephone Encounter (Signed)
Noted. It is still pending. Let her know it can take awhile for that certain test.

## 2015-03-18 LAB — PANCREATIC ELASTASE, FECAL

## 2015-03-21 ENCOUNTER — Telehealth: Payer: Self-pay

## 2015-03-21 NOTE — Telephone Encounter (Signed)
Pt called wanting stool results

## 2015-03-22 NOTE — Telephone Encounter (Signed)
Left the message on vm, pt identified herself on the vm.Told her to call if she has questions.

## 2015-03-22 NOTE — Telephone Encounter (Signed)
Pancreatic elastase test normal. Keep appt at [facility] as recommended.

## 2015-04-02 DIAGNOSIS — E7800 Pure hypercholesterolemia, unspecified: Secondary | ICD-10-CM | POA: Diagnosis not present

## 2015-04-02 DIAGNOSIS — Z86010 Personal history of colonic polyps: Secondary | ICD-10-CM | POA: Diagnosis not present

## 2015-04-02 DIAGNOSIS — K5730 Diverticulosis of large intestine without perforation or abscess without bleeding: Secondary | ICD-10-CM | POA: Diagnosis not present

## 2015-04-02 DIAGNOSIS — R197 Diarrhea, unspecified: Secondary | ICD-10-CM | POA: Diagnosis not present

## 2015-04-16 DIAGNOSIS — R197 Diarrhea, unspecified: Secondary | ICD-10-CM | POA: Diagnosis not present

## 2015-05-15 ENCOUNTER — Encounter: Payer: Self-pay | Admitting: Neurology

## 2015-05-15 ENCOUNTER — Ambulatory Visit (INDEPENDENT_AMBULATORY_CARE_PROVIDER_SITE_OTHER): Payer: Medicare Other | Admitting: Neurology

## 2015-05-15 VITALS — BP 140/79 | HR 67 | Ht 61.0 in | Wt 142.8 lb

## 2015-05-15 DIAGNOSIS — G44209 Tension-type headache, unspecified, not intractable: Secondary | ICD-10-CM | POA: Diagnosis not present

## 2015-05-15 MED ORDER — DIVALPROEX SODIUM ER 500 MG PO TB24: 500.0000 mg | ORAL_TABLET | Freq: Every day | ORAL | Status: DC
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
## 2015-05-15 NOTE — Patient Instructions (Signed)
I had a long discussion with the patient and husband regarding her dizziness which seems to be improved and encouraged her to continue to do stress to the stabilization exercises and follow-up with physical therapy. She also has new complaints of daily headaches which seem like muscle tension headaches. I recommend she do regular neck stretching exercises as well as take Depakote ER 500 mg once daily. We will also check CBC and CMP because of her complaints of decreased appetite, tiredness and weakness. She will follow-up in 3 months with Gilford Raid, nurse practitioner or call earlier if necessary. Tension Headache A tension headache is a feeling of pain, pressure, or aching that is often felt over the front and sides of the head. The pain can be dull, or it can feel tight (constricting). Tension headaches are not normally associated with nausea or vomiting, and they do not get worse with physical activity. Tension headaches can last from 30 minutes to several days. This is the most common type of headache. CAUSES The exact cause of this condition is not known. Tension headaches often begin after stress, anxiety, or depression. Other triggers may include:  Alcohol.  Too much caffeine, or caffeine withdrawal.  Respiratory infections, such as colds, flu, or sinus infections.  Dental problems or teeth clenching.  Fatigue.  Holding your head and neck in the same position for a long period of time, such as while using a computer.  Smoking. SYMPTOMS Symptoms of this condition include:  A feeling of pressure around the head.  Dull, aching head pain.  Pain felt over the front and sides of the head.  Tenderness in the muscles of the head, neck, and shoulders. DIAGNOSIS This condition may be diagnosed based on your symptoms and a physical exam. Tests may be done, such as a CT scan or an MRI of your head. These tests may be done if your symptoms are severe or unusual. TREATMENT This  condition may be treated with lifestyle changes and medicines to help relieve symptoms. HOME CARE INSTRUCTIONS Managing Pain  Take over-the-counter and prescription medicines only as told by your health care provider.  Lie down in a dark, quiet room when you have a headache.  If directed, apply ice to the head and neck area:  Put ice in a plastic bag.  Place a towel between your skin and the bag.  Leave the ice on for 20 minutes, 2-3 times per day.  Use a heating pad or a hot shower to apply heat to the head and neck area as told by your health care provider. Eating and Drinking  Eat meals on a regular schedule.  Limit alcohol use.  Decrease your caffeine intake, or stop using caffeine. General Instructions  Keep all follow-up visits as told by your health care provider. This is important.  Keep a headache journal to help find out what may trigger your headaches. For example, write down:  What you eat and drink.  How much sleep you get.  Any change to your diet or medicines.  Try massage or other relaxation techniques.  Limit stress.  Sit up straight, and avoid tensing your muscles.  Do not use tobacco products, including cigarettes, chewing tobacco, or e-cigarettes. If you need help quitting, ask your health care provider.  Exercise regularly as told by your health care provider.  Get 7-9 hours of sleep, or the amount recommended by your health care provider. SEEK MEDICAL CARE IF:  Your symptoms are not helped by medicine.  You  have a headache that is different from what you normally experience.  You have nausea or you vomit.  You have a fever. SEEK IMMEDIATE MEDICAL CARE IF:  Your headache becomes severe.  You have repeated vomiting.  You have a stiff neck.  You have a loss of vision.  You have problems with speech.  You have pain in your eye or ear.  You have muscular weakness or loss of muscle control.  You lose your balance or you have  trouble walking.  You feel faint or you pass out.  You have confusion.   This information is not intended to replace advice given to you by your health care provider. Make sure you discuss any questions you have with your health care provider.   Document Released: 03/30/2005 Document Revised: 12/19/2014 Document Reviewed: 07/23/2014 Elsevier Interactive Patient Education [employer].

## 2015-05-16 LAB — CBC
Hematocrit: 41.9 % (ref 34.0–46.6)
Hemoglobin: 14.5 g/dL (ref 11.1–15.9)
MCH: 31.5 pg (ref 26.6–33.0)
MCHC: 34.6 g/dL (ref 31.5–35.7)
MCV: 91 fL (ref 79–97)
PLATELETS: 205 10*3/uL (ref 150–379)
RBC: 4.6 x10E6/uL (ref 3.77–5.28)
RDW: 14.1 % (ref 12.3–15.4)
WBC: 6.8 10*3/uL (ref 3.4–10.8)

## 2015-05-16 LAB — COMPREHENSIVE METABOLIC PANEL
A/G RATIO: 1.9 (ref 1.1–2.5)
ALK PHOS: 99 IU/L (ref 39–117)
ALT: 24 IU/L (ref 0–32)
AST: 20 IU/L (ref 0–40)
Albumin: 4.5 g/dL (ref 3.6–4.8)
BUN/Creatinine Ratio: 15 (ref 11–26)
BUN: 9 mg/dL (ref 8–27)
Bilirubin Total: 0.3 mg/dL (ref 0.0–1.2)
CALCIUM: 9.4 mg/dL (ref 8.7–10.3)
CO2: 23 mmol/L (ref 18–29)
CREATININE: 0.61 mg/dL (ref 0.57–1.00)
Chloride: 95 mmol/L — ABNORMAL LOW (ref 96–106)
GFR calc Af Amer: 113 mL/min/{1.73_m2} (ref >59)
GFR, EST NON AFRICAN AMERICAN: 98 mL/min/{1.73_m2} (ref >59)
GLOBULIN, TOTAL: 2.4 g/dL (ref 1.5–4.5)
Glucose: 100 mg/dL — ABNORMAL HIGH (ref 65–99)
Potassium: 4 mmol/L (ref 3.5–5.2)
SODIUM: 139 mmol/L (ref 134–144)
Total Protein: 6.9 g/dL (ref 6.0–8.5)

## 2015-05-16 LAB — SEDIMENTATION RATE: SED RATE: 10 mm/h (ref 0–40)

## 2015-05-20 ENCOUNTER — Telehealth: Payer: Self-pay

## 2015-05-20 NOTE — Telephone Encounter (Signed)
:  LFt vm for patient about lab work results.

## 2015-05-20 NOTE — Telephone Encounter (Signed)
-----   Message from Garvin Fila, MD sent at 05/18/2015  3:47 PM EST ----- Kindly inform patient that bloodwork was unremarkable

## 2015-05-21 NOTE — Telephone Encounter (Signed)
-----   Message from Garvin Fila, MD sent at 05/18/2015  3:47 PM EST ----- Kindly inform patient that bloodwork was unremarkable

## 2015-05-21 NOTE — Telephone Encounter (Signed)
LFt vm for patient to call back about lab results.

## 2015-05-27 NOTE — Telephone Encounter (Signed)
Pt returned [name]'s call. Msg relayed reg labs. Pt understood

## 2015-05-27 NOTE — Telephone Encounter (Signed)
Rn call patient about her lab work being normal. Pt verbalized understanding.

## 2015-06-04 DIAGNOSIS — J019 Acute sinusitis, unspecified: Secondary | ICD-10-CM | POA: Diagnosis not present

## 2015-06-04 DIAGNOSIS — G894 Chronic pain syndrome: Secondary | ICD-10-CM | POA: Diagnosis not present

## 2015-06-04 DIAGNOSIS — H6991 Unspecified Eustachian tube disorder, right ear: Secondary | ICD-10-CM | POA: Diagnosis not present

## 2015-06-04 DIAGNOSIS — Z1389 Encounter for screening for other disorder: Secondary | ICD-10-CM | POA: Diagnosis not present

## 2015-06-04 DIAGNOSIS — Z0001 Encounter for general adult medical examination with abnormal findings: Secondary | ICD-10-CM | POA: Diagnosis not present

## 2015-06-13 DIAGNOSIS — H2513 Age-related nuclear cataract, bilateral: Secondary | ICD-10-CM | POA: Diagnosis not present

## 2015-06-13 DIAGNOSIS — H40033 Anatomical narrow angle, bilateral: Secondary | ICD-10-CM | POA: Diagnosis not present

## 2015-06-27 DIAGNOSIS — Z961 Presence of intraocular lens: Secondary | ICD-10-CM | POA: Diagnosis not present

## 2015-06-27 DIAGNOSIS — H2511 Age-related nuclear cataract, right eye: Secondary | ICD-10-CM | POA: Diagnosis not present

## 2015-07-18 DIAGNOSIS — H2512 Age-related nuclear cataract, left eye: Secondary | ICD-10-CM | POA: Diagnosis not present

## 2015-07-31 ENCOUNTER — Encounter: Payer: Self-pay | Admitting: Internal Medicine

## 2015-08-12 ENCOUNTER — Ambulatory Visit: Payer: Self-pay | Admitting: Nurse Practitioner

## 2015-08-19 ENCOUNTER — Ambulatory Visit (INDEPENDENT_AMBULATORY_CARE_PROVIDER_SITE_OTHER): Payer: Self-pay | Admitting: Nurse Practitioner

## 2015-08-19 DIAGNOSIS — G44209 Tension-type headache, unspecified, not intractable: Secondary | ICD-10-CM

## 2015-08-19 NOTE — Progress Notes (Signed)
Encounter opened in error

## 2015-08-20 ENCOUNTER — Encounter: Payer: Self-pay | Admitting: Neurology

## 2015-09-02 ENCOUNTER — Encounter: Payer: Self-pay | Admitting: Gastroenterology

## 2015-09-02 ENCOUNTER — Ambulatory Visit (INDEPENDENT_AMBULATORY_CARE_PROVIDER_SITE_OTHER): Payer: Medicare Other | Admitting: Gastroenterology

## 2015-09-02 VITALS — BP 171/87 | HR 87 | Temp 97.4°F | Ht 61.0 in | Wt 148.4 lb

## 2015-09-02 DIAGNOSIS — K580 Irritable bowel syndrome with diarrhea: Secondary | ICD-10-CM

## 2015-09-02 DIAGNOSIS — R1011 Right upper quadrant pain: Secondary | ICD-10-CM

## 2015-09-02 MED ORDER — DICYCLOMINE HCL 10 MG PO CAPS: ORAL_CAPSULE | ORAL | Status: DC

## 2015-09-02 MED ORDER — RIFAXIMIN 550 MG PO TABS: 550.0000 mg | ORAL_TABLET | Freq: Three times a day (TID) | ORAL | Status: DC

## 2015-09-02 NOTE — Progress Notes (Signed)
Primary Care Physician: Purvis Kilts, MD  Primary Gastroenterologist:  Garfield Cornea, MD   Chief Complaint  Patient presents with  . Diarrhea    was on viberzi    HPI: [name] is a 60 y.o. female here for follow-up visit because she previously was on Viberzi for IBS-D but with new recommendations out regarding patients with prior cholecystectomy she should not be on the medication. We brought her in for discussion at no charge.  EGD in October 2016 for epigastric pain, normal. CTA last fall with widespread vascular disease but no evidence of mesenteric ischemia. Colonoscopy May 2014 with rectal polyp, colonic diverticulosis, hyperplastic polyp on path. Next colonoscopy May 2019. She was suspected to have bacterial overgrowth on a hydrogen breath test. Given 7 day course of Augmentin. No significant improvement of her symptoms. Stool pancreatic elastase test also normal. Referred to [facility]. Consensus was likely IBS D with superimposed small bowel bacterial overgrowth. Provided FODMAP diet and Donnatal. Limited caffeine and sorbitol-containing products. Remeron next step if needed.  Patient states that Donnatal helped for a while. She got the most relief on Viberzi. Stop this medication when she received her letter for 6 weeks ago. Diarrhea now back. She's been taking Bentyl chronically 10 mg every before meals daily at bedtime. Previously worked pretty well but seemed to lose effectiveness. Most a she has 4-5 loose stools. Associated with bloating. Continues to have right-sided abdominal pain which is chronic. The only time her stools for formed is when she takes pain medication for her back. Denies blood in the stool or melena. No weight loss.    Dr. Benjamine Mola, ringing in ear and unsteadiness.    Current Outpatient Prescriptions  Medication Sig Dispense Refill  . aspirin 81 MG tablet Take 81 mg by mouth daily.    Marland Kitchen dicyclomine (BENTYL) 10 MG capsule     . divalproex  (DEPAKOTE ER) 500 MG 24 hr tablet Take 1 tablet (500 mg total) by mouth daily. 30 tablet 3  . lisinopril-hydrochlorothiazide (PRINZIDE,ZESTORETIC) 10-12.5 MG tablet Take 1 tablet by mouth daily.     . pantoprazole (PROTONIX) 40 MG tablet Take 40 mg by mouth daily.     . simvastatin (ZOCOR) 20 MG tablet Take 20 mg by mouth daily.     Marland Kitchen zolpidem (AMBIEN) 5 MG tablet Take 5 mg by mouth at bedtime as needed for sleep.     No current facility-administered medications for this visit.    Allergies as of 09/02/2015 - Review Complete 09/02/2015  Allergen Reaction Noted  . Propoxyphene n-acetaminophen Itching   . Tape Other (See Comments) 02/10/2012    ROS:  General: Negative for anorexia, weight loss, fever, chills, fatigue, weakness. ENT: Negative for hoarseness, difficulty swallowing , nasal congestion. CV: Negative for chest pain, angina, palpitations, dyspnea on exertion, peripheral edema.  Respiratory: Negative for dyspnea at rest, dyspnea on exertion, cough, sputum, wheezing.  GI: See history of present illness. GU:  Negative for dysuria, hematuria, urinary incontinence, urinary frequency, nocturnal urination.  Endo: Negative for unusual weight change.    Physical Examination:   BP 171/87 mmHg  Pulse 87  Temp(Src) 97.4 F (36.3 C) (Oral)  Ht 5\' 1"  (1.549 m)  Wt 148 lb 6.4 oz (67.314 kg)  BMI 28.05 kg/m2  General: Well-nourished, well-developed in no acute distress.  Eyes: No icterus. Mouth: Oropharyngeal mucosa moist and pink , no lesions erythema or exudate. Lungs: Clear to auscultation bilaterally.  Heart: Regular rate and  rhythm, no murmurs rubs or gallops.  Abdomen: Bowel sounds are normal, right upper quadrant abdominal pain with deep palpation.  nondistended, no hepatosplenomegaly or masses, no abdominal bruits or hernia , no rebound or guarding.   Extremities: No lower extremity edema. No clubbing or deformities. Neuro: Alert and oriented x 4   Skin: Warm and dry, no  jaundice.   Psych: Alert and cooperative, normal mood and affect.  Labs:  Lab Results  Component Value Date   WBC 6.8 05/15/2015   HGB 14.5 05/15/2015   HCT 41.9 05/15/2015   MCV 91 05/15/2015   PLT 205 05/15/2015   Lab Results  Component Value Date   CREATININE 0.61 05/15/2015   BUN 9 05/15/2015   NA 139 05/15/2015   K 4.0 05/15/2015   CL 95* 05/15/2015   CO2 23 05/15/2015   Lab Results  Component Value Date   ALT 24 05/15/2015   AST 20 05/15/2015   ALKPHOS 99 05/15/2015   BILITOT 0.3 05/15/2015    Imaging Studies: No results found.

## 2015-09-02 NOTE — Patient Instructions (Signed)
1. Increase bentyl to 2 capsule before meals and at bedtime. No more than 8 daily. New RX sent to pharmacy. 2. I sent RX for Xifaxan to take three times daily for 2 weeks for IBS. Let me know if not covered. 3. Speak with your PCP about your blood pressure concerns.  4. Call if your bowel movements and abdominal pain do not improve. Otherwise office visit in six months.

## 2015-09-03 DIAGNOSIS — I10 Essential (primary) hypertension: Secondary | ICD-10-CM | POA: Diagnosis not present

## 2015-09-03 NOTE — Assessment & Plan Note (Addendum)
IBS-D. Was doing well on Viberzi but now off medication given recent change in product information. Patient aware not to take anymore.   Increase bentyl to 20mg  qac and qhs. 2 week course of Xifaxan 550mg  TID.  With regards to ruq pain, chronic in nature. Suspect musculoskeletal component. Has been present at least since last hernia surgery. Numerous CTs without etiology for pain.   Return to the office in six months.

## 2015-09-04 ENCOUNTER — Telehealth: Payer: Self-pay

## 2015-09-04 NOTE — Telephone Encounter (Signed)
Pt called and states that the xiafaxin was $1600.00. States she is needing samples.

## 2015-09-04 NOTE — Progress Notes (Signed)
cc'ed to pcp °

## 2015-09-04 NOTE — Telephone Encounter (Signed)
Please let patient know that we don't have enough samples at this time. We will see if we can get medication covered. In the meantime, continue with increased dose of bentyl as per OV instructions.   [employer] rep suggested trying [email or web address]

## 2015-09-05 NOTE — Telephone Encounter (Signed)
OK. Please let her know we will have to try the increased dose o bentyl for now. Call if does not help.

## 2015-09-05 NOTE — Telephone Encounter (Signed)
This was approved yesterday. 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

## 2015-09-06 DIAGNOSIS — H9209 Otalgia, unspecified ear: Secondary | ICD-10-CM | POA: Diagnosis not present

## 2015-09-06 DIAGNOSIS — H6983 Other specified disorders of Eustachian tube, bilateral: Secondary | ICD-10-CM | POA: Diagnosis not present

## 2015-09-06 DIAGNOSIS — H9313 Tinnitus, bilateral: Secondary | ICD-10-CM | POA: Diagnosis not present

## 2015-09-06 NOTE — Telephone Encounter (Signed)
Pt is aware. I told her that we can try to go thru the pt assistance if the bentyl doesn't help. She will call and let me know.

## 2015-09-12 ENCOUNTER — Telehealth: Payer: Self-pay | Admitting: Internal Medicine

## 2015-09-12 NOTE — Telephone Encounter (Signed)
Can we find out if the drug reps can give [place] Xifaxan 550mg  tid for 14 days for this patient? Stop bentyl if she feels it is causing her ringing in her ears. She has been on it chronically but we recently increased dose. Keep ent appt.

## 2015-09-12 NOTE — Telephone Encounter (Signed)
The medication was bentyl.

## 2015-09-12 NOTE — Telephone Encounter (Signed)
Pt called this morning to say that the medicine [facility]SL put her on is causing her to have ringing in her ears and was there anything else she could take. Please call [phone] and if she isn't there we can leave a message with her husband, Wille Glaser.

## 2015-09-13 NOTE — Telephone Encounter (Signed)
Tried to call pt- NA 

## 2015-09-18 NOTE — Telephone Encounter (Signed)
zifaxan helpline paperwork has been signed and fax to the company

## 2015-09-18 NOTE — Telephone Encounter (Signed)
I heard back from the xifaxan rep, she is hoping she will have some samples next week, but she cannot guarantee it. I have filled out the form for the xifaxan help line. It is on [facility] desk to be signed.

## 2015-09-26 NOTE — Telephone Encounter (Signed)
Drug rep came in and brought samples of xifaxan. I have called and informed the pts husband. He said they will pick it up tomorrow. xifaxan samples are at the front desk.

## 2015-09-26 NOTE — Telephone Encounter (Signed)
xifaxan assistance ([employer]) called and left a voicemail. pts xifaxan has been denied by the insurance. They want to know if you want to do an appeal. If so, they need an appeal letter to send to them.

## 2015-09-30 ENCOUNTER — Telehealth: Payer: Self-pay | Admitting: Internal Medicine

## 2015-09-30 NOTE — Telephone Encounter (Signed)
[place]

## 2015-09-30 NOTE — Telephone Encounter (Signed)
Spoke with the pts husband-Joe, pt has been on the xifaxan for 3 days and has noticed that she is having lower abd pain and the diarrhea is worse. He said that her diarrhea is yellow and very soft to watery. Pt has at least 6 episodes of diarrhea a day. They are not sure if she has any blood in it or not.  She has not taken any xifaxan today and is not sure if she should take any more. Should she continue taking it?

## 2015-09-30 NOTE — Telephone Encounter (Signed)
Tried to call pt- NA- LMOM 

## 2015-10-01 ENCOUNTER — Other Ambulatory Visit: Payer: Self-pay

## 2015-10-01 ENCOUNTER — Other Ambulatory Visit: Payer: Self-pay | Admitting: Gastroenterology

## 2015-10-01 DIAGNOSIS — R197 Diarrhea, unspecified: Secondary | ICD-10-CM

## 2015-10-01 NOTE — Telephone Encounter (Signed)
Communication noted.  

## 2015-10-01 NOTE — Telephone Encounter (Signed)
Pt is aware. Stool container and orders are at the front desk. Pt will send someone to pick it up tomorrow. Please schedule ov with RMR.

## 2015-10-01 NOTE — Telephone Encounter (Signed)
Tried to call pt- NA-LM to return call.  

## 2015-10-01 NOTE — Telephone Encounter (Signed)
I really don't think the Xifaxan would cause her low abd pain or diarrhea. If she does not want to take it it is up to her. I think we should be 100% sure there is not infectious process in addition to her IBS-D.  Please arrange for GI pathogen panel. Needs ov with RMR asap.

## 2015-10-02 ENCOUNTER — Encounter: Payer: Self-pay | Admitting: Internal Medicine

## 2015-10-02 NOTE — Telephone Encounter (Signed)
APPOINTMENT CARD WITH SAMPLE KIT

## 2015-10-07 ENCOUNTER — Ambulatory Visit (INDEPENDENT_AMBULATORY_CARE_PROVIDER_SITE_OTHER): Payer: Medicare Other | Admitting: Otolaryngology

## 2015-10-07 DIAGNOSIS — H9311 Tinnitus, right ear: Secondary | ICD-10-CM | POA: Diagnosis not present

## 2015-10-07 DIAGNOSIS — H9201 Otalgia, right ear: Secondary | ICD-10-CM

## 2015-10-07 DIAGNOSIS — H6981 Other specified disorders of Eustachian tube, right ear: Secondary | ICD-10-CM | POA: Diagnosis not present

## 2015-10-11 DIAGNOSIS — H9311 Tinnitus, right ear: Secondary | ICD-10-CM | POA: Diagnosis not present

## 2015-10-11 DIAGNOSIS — H6981 Other specified disorders of Eustachian tube, right ear: Secondary | ICD-10-CM | POA: Diagnosis not present

## 2015-10-25 DIAGNOSIS — G894 Chronic pain syndrome: Secondary | ICD-10-CM | POA: Diagnosis not present

## 2015-10-25 DIAGNOSIS — Z1389 Encounter for screening for other disorder: Secondary | ICD-10-CM | POA: Diagnosis not present

## 2015-11-14 ENCOUNTER — Other Ambulatory Visit (INDEPENDENT_AMBULATORY_CARE_PROVIDER_SITE_OTHER): Payer: Self-pay | Admitting: Otolaryngology

## 2015-11-14 ENCOUNTER — Ambulatory Visit (INDEPENDENT_AMBULATORY_CARE_PROVIDER_SITE_OTHER): Payer: Medicare Other | Admitting: Otolaryngology

## 2015-11-14 DIAGNOSIS — H6983 Other specified disorders of Eustachian tube, bilateral: Secondary | ICD-10-CM | POA: Diagnosis not present

## 2015-11-14 DIAGNOSIS — H9311 Tinnitus, right ear: Secondary | ICD-10-CM

## 2015-11-14 DIAGNOSIS — H9313 Tinnitus, bilateral: Secondary | ICD-10-CM | POA: Diagnosis not present

## 2015-11-14 DIAGNOSIS — H7201 Central perforation of tympanic membrane, right ear: Secondary | ICD-10-CM | POA: Diagnosis not present

## 2015-11-19 ENCOUNTER — Encounter: Payer: Self-pay | Admitting: Internal Medicine

## 2015-11-19 ENCOUNTER — Ambulatory Visit: Payer: Self-pay | Admitting: Internal Medicine

## 2015-11-27 ENCOUNTER — Ambulatory Visit (HOSPITAL_COMMUNITY)
Admission: RE | Admit: 2015-11-27 | Discharge: 2015-11-27 | Disposition: A | Discharge status: Home / Self Care | Payer: Medicare Other | Source: Ambulatory Visit | Attending: Otolaryngology | Admitting: Otolaryngology

## 2015-11-27 ENCOUNTER — Other Ambulatory Visit (HOSPITAL_COMMUNITY): Payer: Self-pay | Admitting: Family Medicine

## 2015-11-27 DIAGNOSIS — Z1231 Encounter for screening mammogram for malignant neoplasm of breast: Secondary | ICD-10-CM

## 2015-11-27 DIAGNOSIS — H9311 Tinnitus, right ear: Secondary | ICD-10-CM | POA: Insufficient documentation

## 2015-11-27 DIAGNOSIS — I6782 Cerebral ischemia: Secondary | ICD-10-CM | POA: Diagnosis not present

## 2015-11-27 DIAGNOSIS — H9313 Tinnitus, bilateral: Secondary | ICD-10-CM | POA: Diagnosis not present

## 2015-11-27 LAB — POCT I-STAT, CHEM 8
BUN: 9 mg/dL (ref 6–20)
CALCIUM ION: 1.18 mmol/L (ref 1.12–1.23)
Chloride: 101 mmol/L (ref 101–111)
Creatinine, Ser: 0.6 mg/dL (ref 0.44–1.00)
Glucose, Bld: 110 mg/dL — ABNORMAL HIGH (ref 65–99)
HEMATOCRIT: 40 % (ref 36.0–46.0)
Hemoglobin: 13.6 g/dL (ref 12.0–15.0)
Potassium: 3.9 mmol/L (ref 3.5–5.1)
SODIUM: 140 mmol/L (ref 135–145)
TCO2: 26 mmol/L (ref 0–100)

## 2015-11-27 MED ORDER — GADOBENATE DIMEGLUMINE 529 MG/ML IV SOLN
13.0000 mL | Freq: Once | INTRAVENOUS | Status: AC | PRN
  Administered 2015-11-27: 13 mL via INTRAVENOUS

## 2015-11-28 ENCOUNTER — Ambulatory Visit (HOSPITAL_COMMUNITY): Payer: Self-pay

## 2015-12-24 DIAGNOSIS — M961 Postlaminectomy syndrome, not elsewhere classified: Secondary | ICD-10-CM | POA: Diagnosis not present

## 2015-12-31 ENCOUNTER — Other Ambulatory Visit (HOSPITAL_COMMUNITY): Payer: Self-pay | Admitting: Orthopedic Surgery

## 2015-12-31 DIAGNOSIS — M961 Postlaminectomy syndrome, not elsewhere classified: Secondary | ICD-10-CM

## 2016-01-02 DIAGNOSIS — J449 Chronic obstructive pulmonary disease, unspecified: Secondary | ICD-10-CM | POA: Diagnosis not present

## 2016-01-02 DIAGNOSIS — I6529 Occlusion and stenosis of unspecified carotid artery: Secondary | ICD-10-CM | POA: Diagnosis not present

## 2016-01-02 DIAGNOSIS — J209 Acute bronchitis, unspecified: Secondary | ICD-10-CM | POA: Diagnosis not present

## 2016-01-03 ENCOUNTER — Ambulatory Visit (HOSPITAL_COMMUNITY): Payer: Medicare Other

## 2016-01-10 ENCOUNTER — Ambulatory Visit (HOSPITAL_COMMUNITY)
Admission: RE | Admit: 2016-01-10 | Discharge: 2016-01-10 | Disposition: A | Discharge status: Home / Self Care | Payer: Medicare Other | Source: Ambulatory Visit | Attending: Orthopedic Surgery | Admitting: Orthopedic Surgery

## 2016-01-10 ENCOUNTER — Ambulatory Visit (HOSPITAL_COMMUNITY)
Admission: RE | Admit: 2016-01-10 | Discharge: 2016-01-10 | Disposition: A | Discharge status: Home / Self Care | Payer: Medicare Other | Source: Ambulatory Visit | Attending: Family Medicine | Admitting: Family Medicine

## 2016-01-10 DIAGNOSIS — M5126 Other intervertebral disc displacement, lumbar region: Secondary | ICD-10-CM | POA: Insufficient documentation

## 2016-01-10 DIAGNOSIS — R928 Other abnormal and inconclusive findings on diagnostic imaging of breast: Secondary | ICD-10-CM | POA: Insufficient documentation

## 2016-01-10 DIAGNOSIS — M961 Postlaminectomy syndrome, not elsewhere classified: Secondary | ICD-10-CM | POA: Diagnosis not present

## 2016-01-10 DIAGNOSIS — Z1231 Encounter for screening mammogram for malignant neoplasm of breast: Secondary | ICD-10-CM | POA: Diagnosis not present

## 2016-01-10 MED ORDER — GADOBENATE DIMEGLUMINE 529 MG/ML IV SOLN
15.0000 mL | Freq: Once | INTRAVENOUS | Status: AC | PRN
  Administered 2016-01-10: 13 mL via INTRAVENOUS

## 2016-01-15 ENCOUNTER — Other Ambulatory Visit: Payer: Self-pay | Admitting: Family Medicine

## 2016-01-15 DIAGNOSIS — R928 Other abnormal and inconclusive findings on diagnostic imaging of breast: Secondary | ICD-10-CM

## 2016-01-21 DIAGNOSIS — M5136 Other intervertebral disc degeneration, lumbar region: Secondary | ICD-10-CM | POA: Diagnosis not present

## 2016-01-21 DIAGNOSIS — M961 Postlaminectomy syndrome, not elsewhere classified: Secondary | ICD-10-CM | POA: Diagnosis not present

## 2016-01-22 ENCOUNTER — Other Ambulatory Visit: Payer: Self-pay

## 2016-01-24 ENCOUNTER — Ambulatory Visit
Admission: RE | Admit: 2016-01-24 | Discharge: 2016-01-24 | Disposition: A | Discharge status: Home / Self Care | Payer: Medicare Other | Source: Ambulatory Visit | Attending: Family Medicine | Admitting: Family Medicine

## 2016-01-24 DIAGNOSIS — R928 Other abnormal and inconclusive findings on diagnostic imaging of breast: Secondary | ICD-10-CM | POA: Diagnosis not present

## 2016-01-24 DIAGNOSIS — N6489 Other specified disorders of breast: Secondary | ICD-10-CM | POA: Diagnosis not present

## 2016-01-28 ENCOUNTER — Encounter (HOSPITAL_COMMUNITY): Payer: Self-pay

## 2016-02-03 DIAGNOSIS — M5136 Other intervertebral disc degeneration, lumbar region: Secondary | ICD-10-CM | POA: Diagnosis not present

## 2016-02-03 DIAGNOSIS — M961 Postlaminectomy syndrome, not elsewhere classified: Secondary | ICD-10-CM | POA: Diagnosis not present

## 2016-03-09 ENCOUNTER — Telehealth: Payer: Self-pay | Admitting: Gastroenterology

## 2016-03-09 ENCOUNTER — Ambulatory Visit: Payer: Self-pay | Admitting: Gastroenterology

## 2016-03-09 ENCOUNTER — Encounter: Payer: Self-pay | Admitting: Gastroenterology

## 2016-03-09 NOTE — Telephone Encounter (Signed)
PT WAS A NO SHOW AND LETTER SENT  °

## 2016-03-13 DIAGNOSIS — G894 Chronic pain syndrome: Secondary | ICD-10-CM | POA: Diagnosis not present

## 2016-04-03 IMAGING — MR MR HEAD W/O CM
6 of 10 series · 24 of 48 positions shown · non-contrast
Comparison: CT head 09/19/2013

CLINICAL DATA: TIA

EXAM:
MRI HEAD WITHOUT CONTRAST
TECHNIQUE: Multiplanar, multiecho pulse sequences of the brain and surrounding
structures were obtained without intravenous contrast.

[Series 1: t1_fl2d_sag · sagittal · 5.0mm · 0.43mm/px · 4 of 20 slices shown]
[im 1/20]
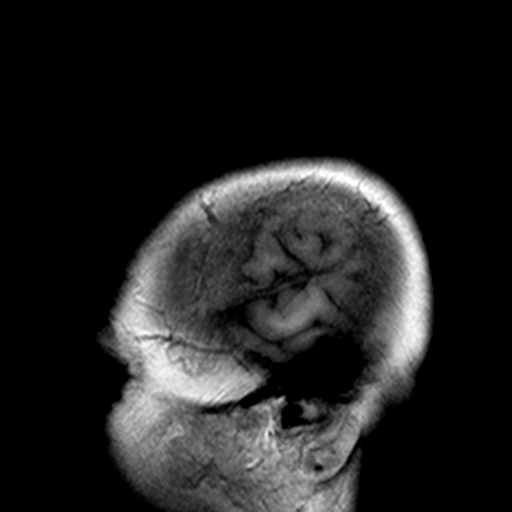
[im 7/20]
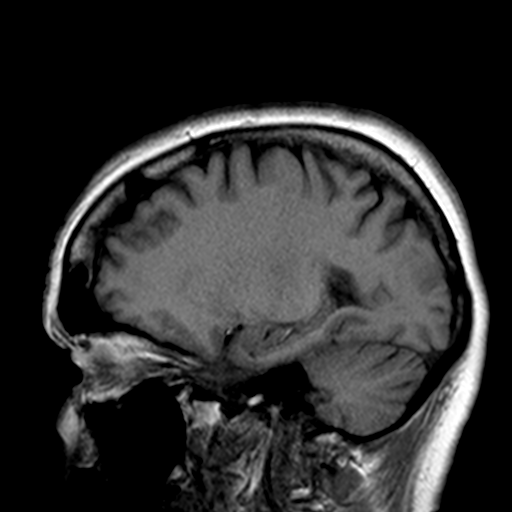
[im 13/20]
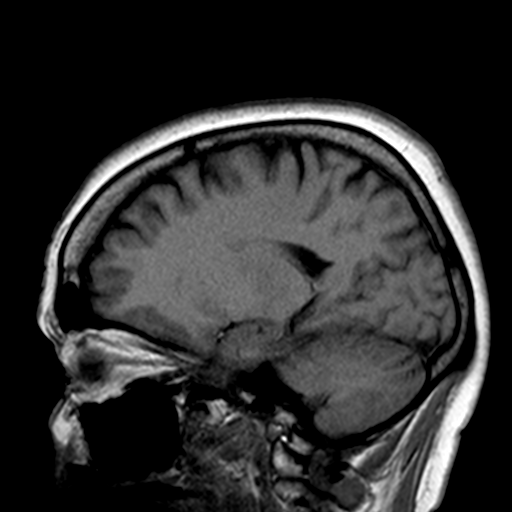
[im 20/20]
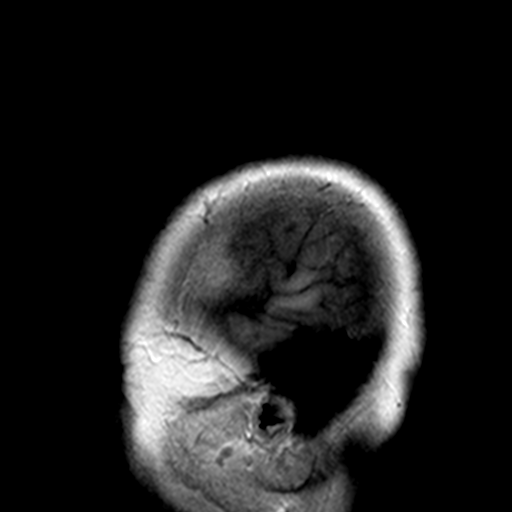

[Series 4: T2 · axial · 5.0mm · 0.72mm/px · z∈[-119,+11]mm · 3 of 21 slices shown (1 of 2)]
[im 1/21]
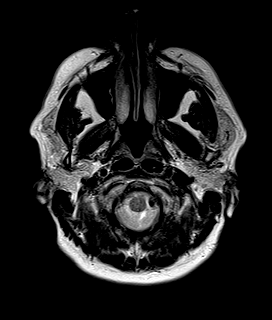
[im 11/21]
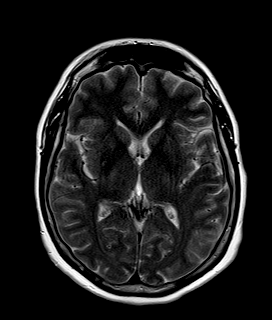
[im 21/21]
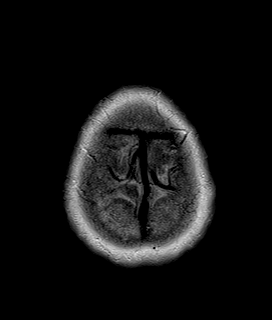

[Series 5: FLAIR · axial · 5.0mm · 0.38mm/px · z∈[-125,+18]mm · 3 of 23 slices shown]
[im 1/23]
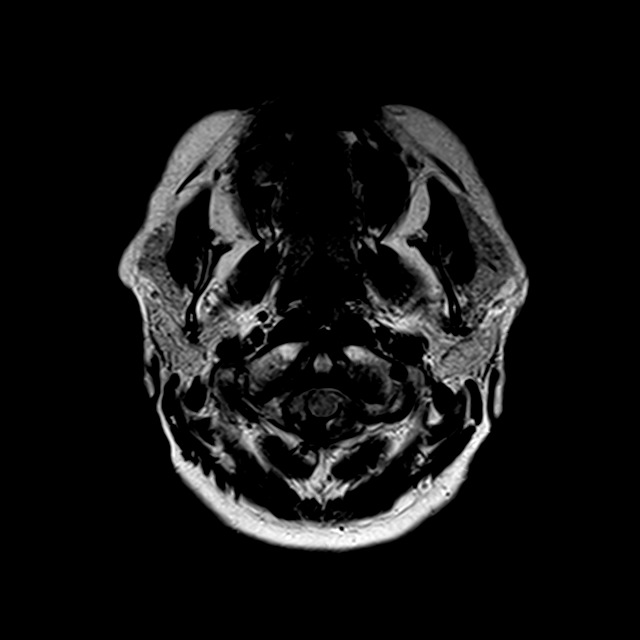
[im 12/23]
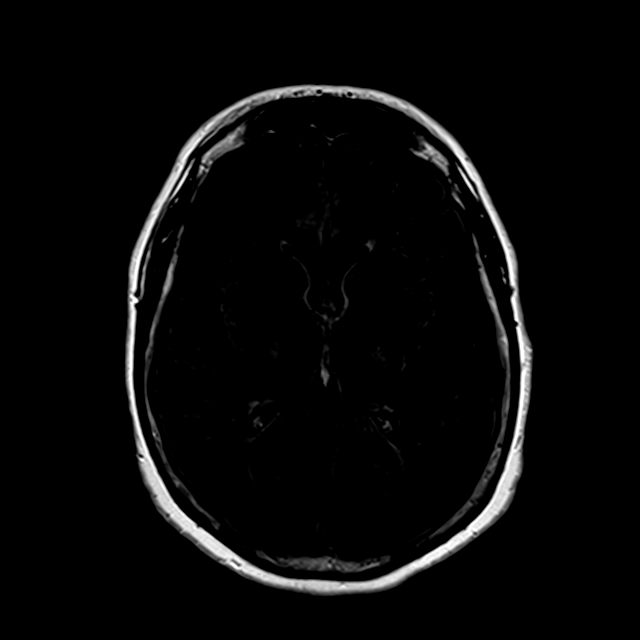
[im 23/23]
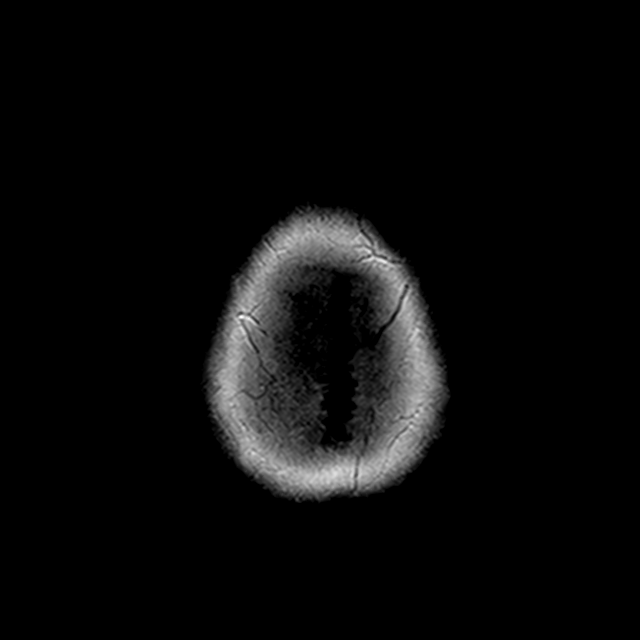

[Series 6: T1 · axial · 2.0mm · 0.45mm/px · z∈[-140,+48]mm · 8 of 95 slices shown]
[im 1/95]
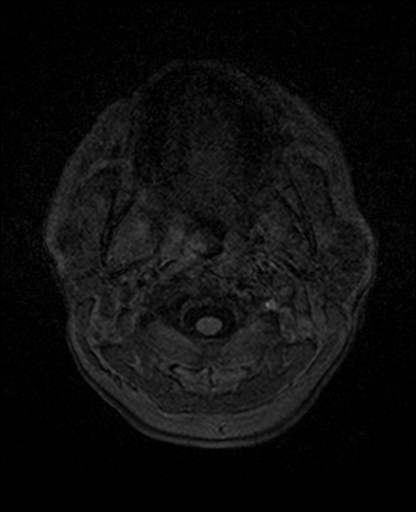
[im 15/95]
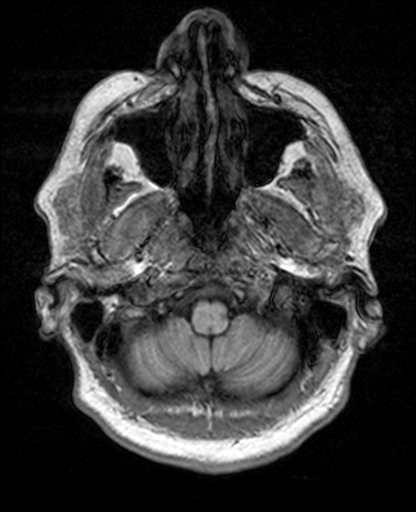
[im 29/95]
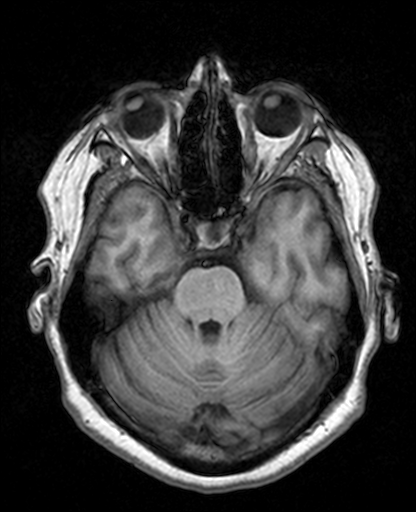
[im 44/95]
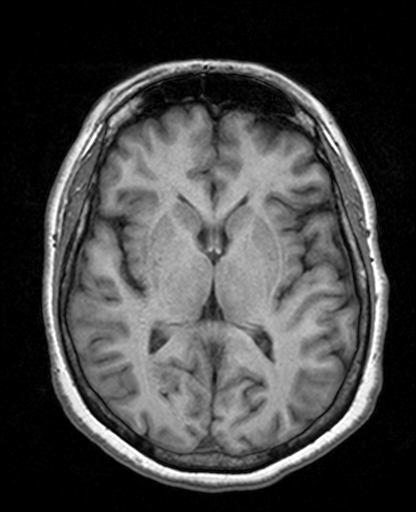
[im 51/95]
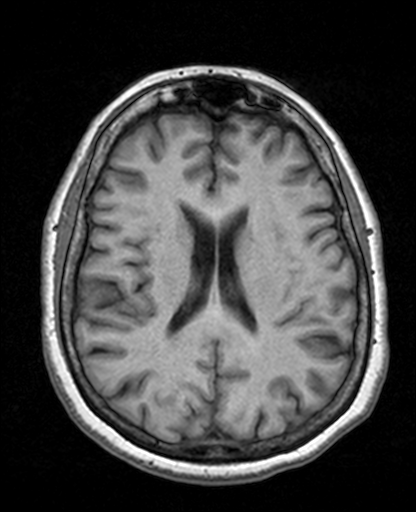
[im 66/95]
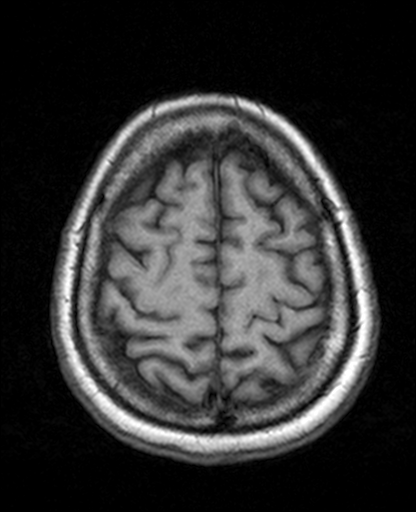
[im 80/95]
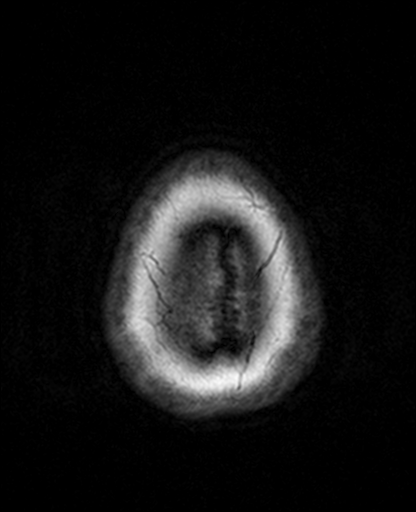
[im 95/95]
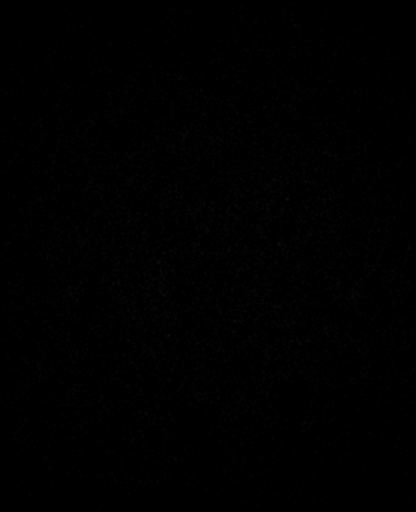

[Series 7: trauma axial · axial · 5.0mm · 0.45mm/px · z∈[-119,+11]mm · 3 of 21 slices shown]
[im 1/21]
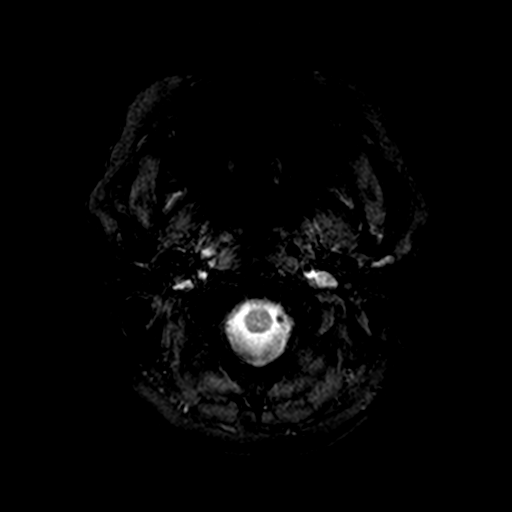
[im 11/21]
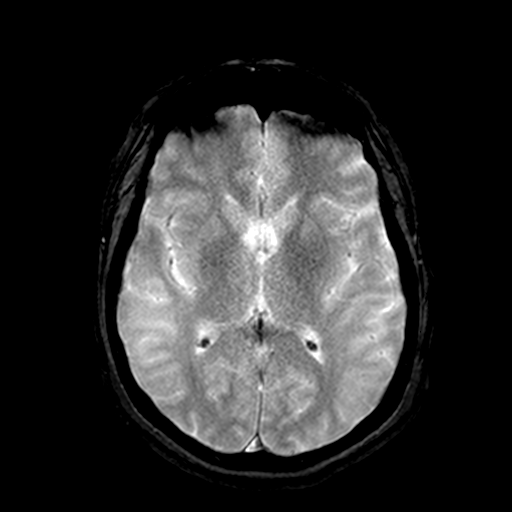
[im 21/21]
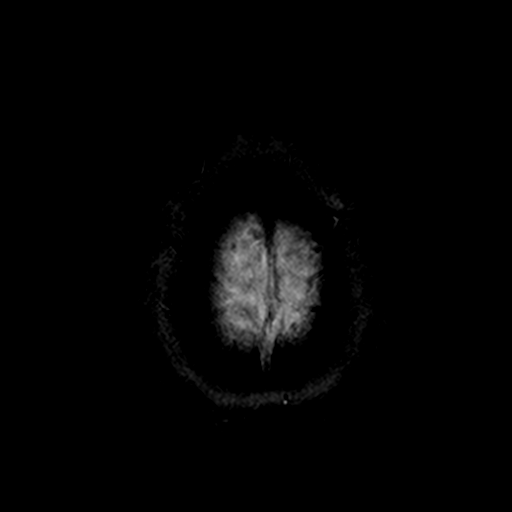

[Series 8: T2 · coronal · 5.0mm · 0.66mm/px · 3 of 24 slices shown (2 of 2)]
[im 1/24]
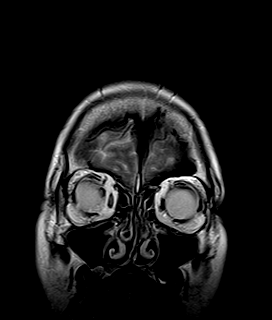
[im 12/24]
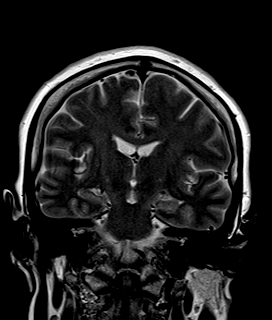
[im 24/24]
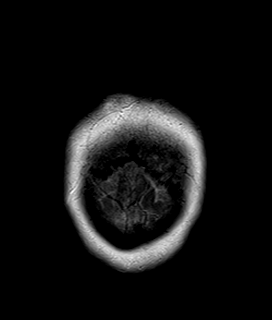

[24 of 48 positions shown; findings below may reference images not displayed]

FINDINGS: Negative for acute infarct.

Mild chronic microvascular ischemic changes in the white matter
bilaterally. Chronic infarct in the right putamen. Brainstem and
cerebellum intact.

Negative for hemorrhage or mass.  Ventricle size is normal.

Paranasal sinuses are clear.
IMPRESSION: Chronic microvascular ischemic changes, mild.  No acute abnormality.

## 2016-04-03 IMAGING — CT CT HEAD W/O CM
1 series · 16 of 30 positions shown, 20 images · non-contrast
Comparison: 07/14/2009

CLINICAL DATA: Loss of consciousness, high blood pressure

EXAM:
CT HEAD WITHOUT CONTRAST
TECHNIQUE: Contiguous axial images were obtained from the base of the skull
through the vertex without intravenous contrast.

[Series 2: headtrauma 4.8 h37s · axial · 0.47mm/px · z∈[+116,+279]mm · 16 of 36 slices shown, 20 images]
[im 2/36  brain]
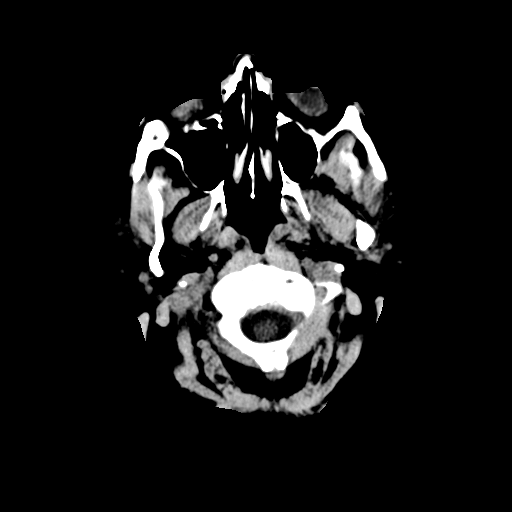
[im 2/36  bone]
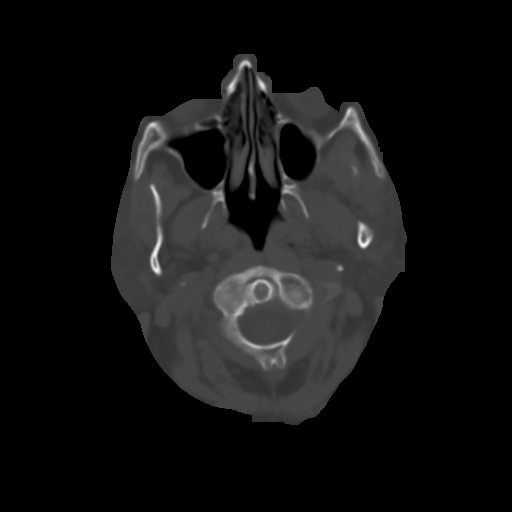
[im 4/36  brain]
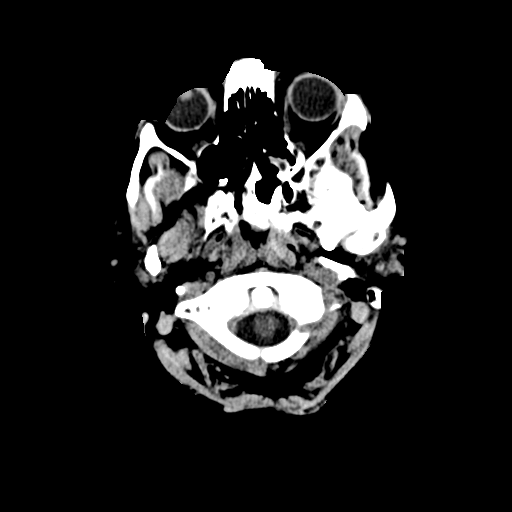
[im 7/36  brain]
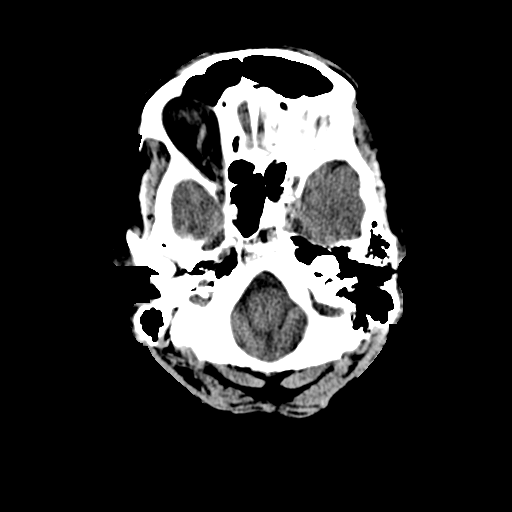
[im 9/36  brain]
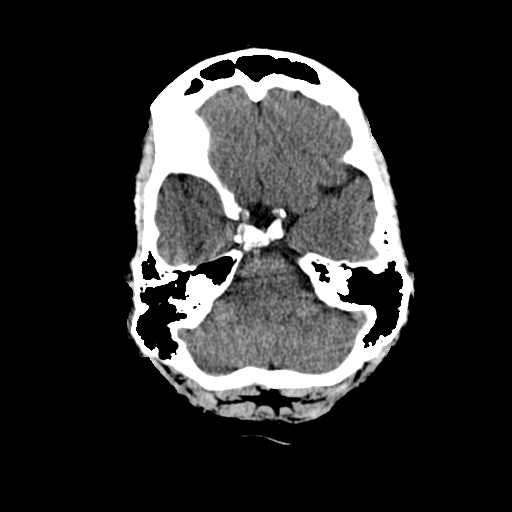
[im 10/36  brain]
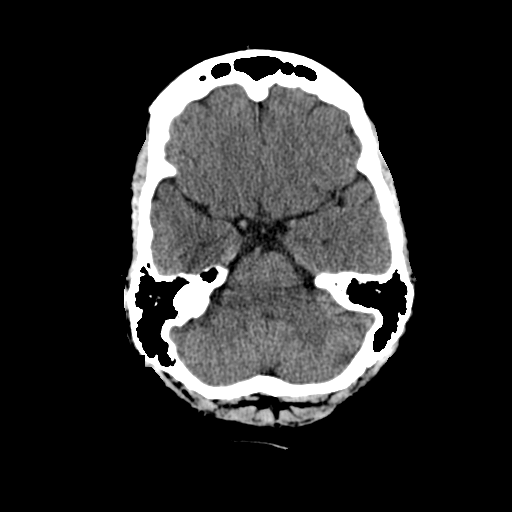
[im 10/36  bone]
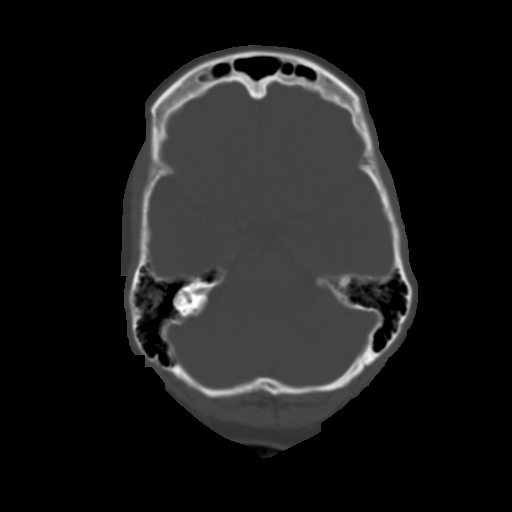
[im 13/36  brain]
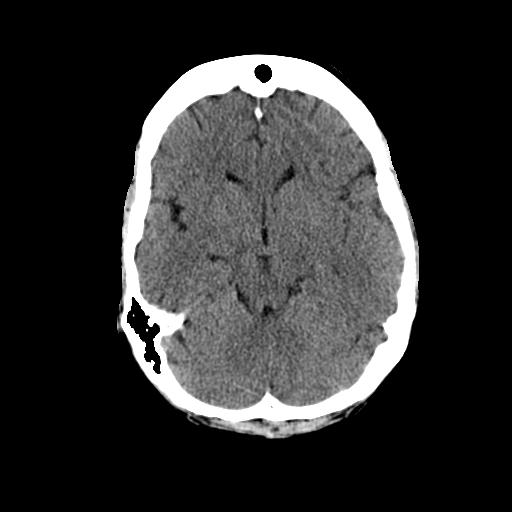
[im 15/36  brain]
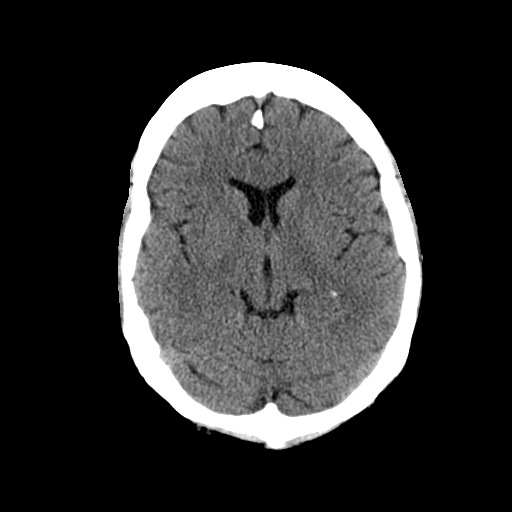
[im 17/36  brain]
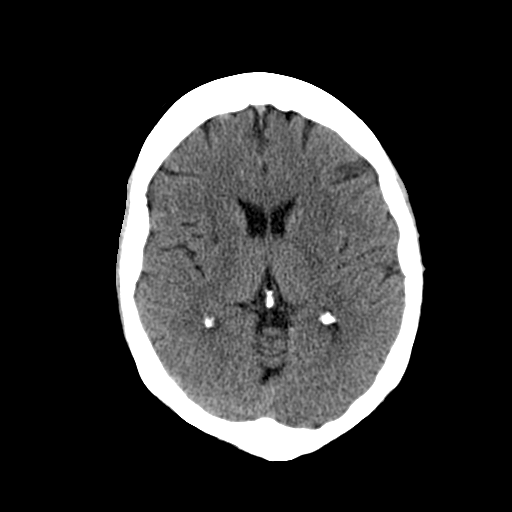
[im 19/36  brain]
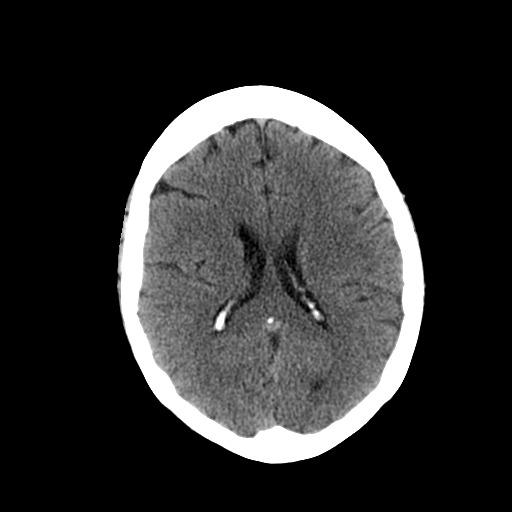
[im 19/36  bone]
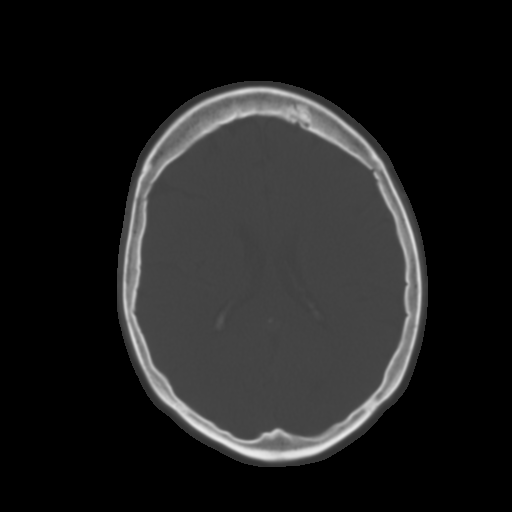
[im 21/36  brain]
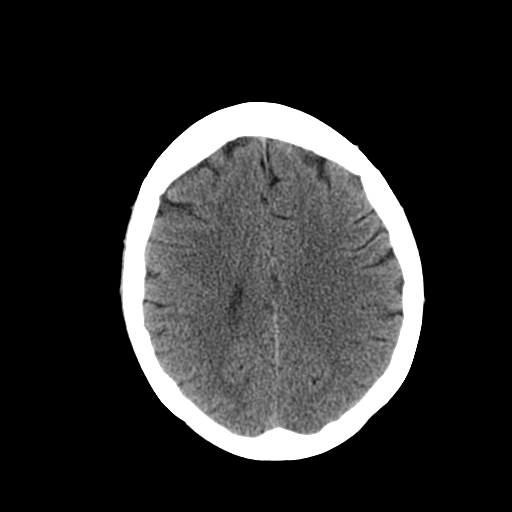
[im 23/36  brain]
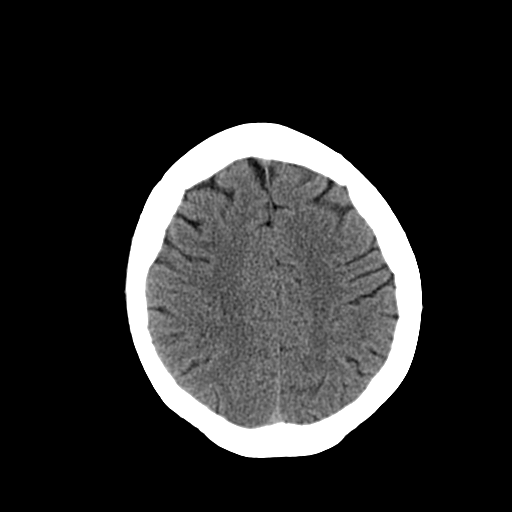
[im 26/36  brain]
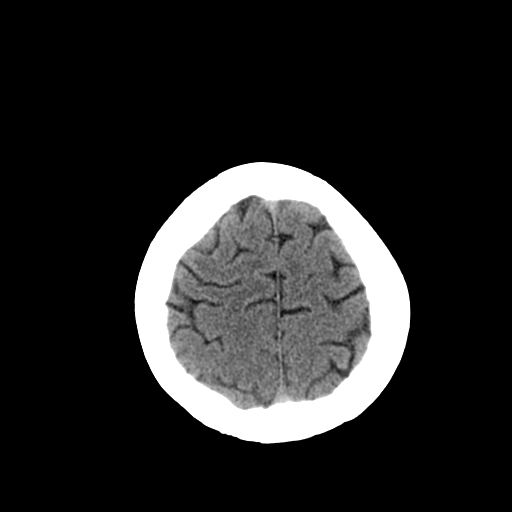
[im 27/36  brain]
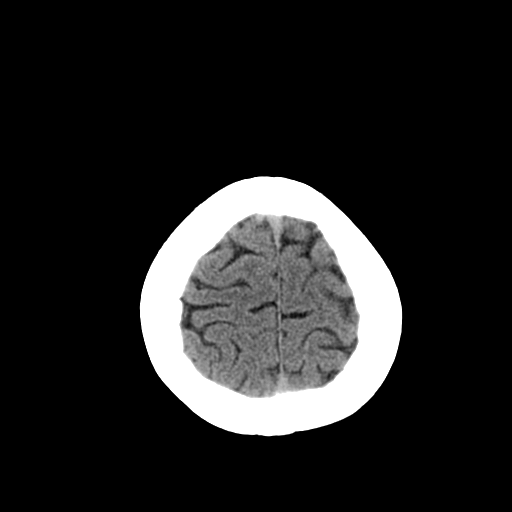
[im 27/36  bone]
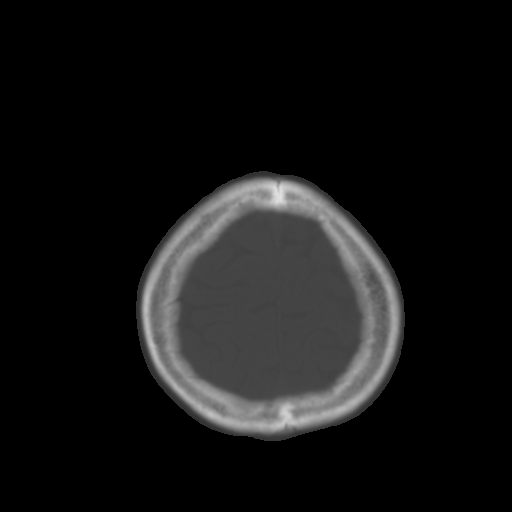
[im 29/36  brain]
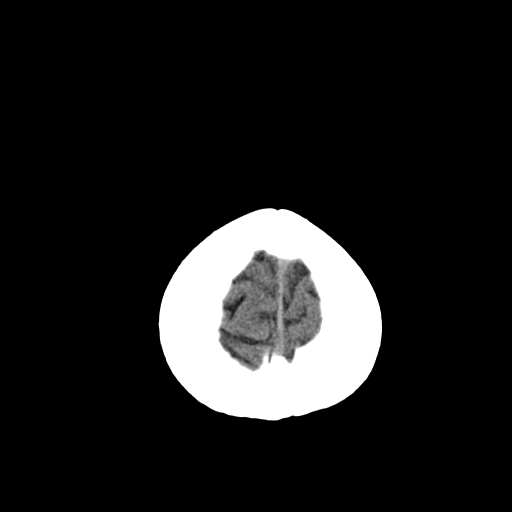
[im 32/36  brain]
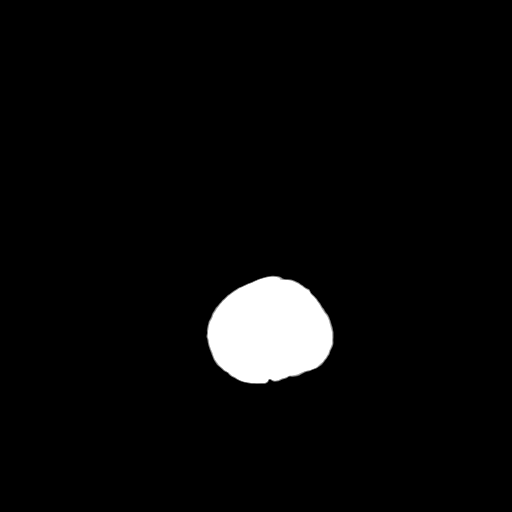
[im 34/36  brain]
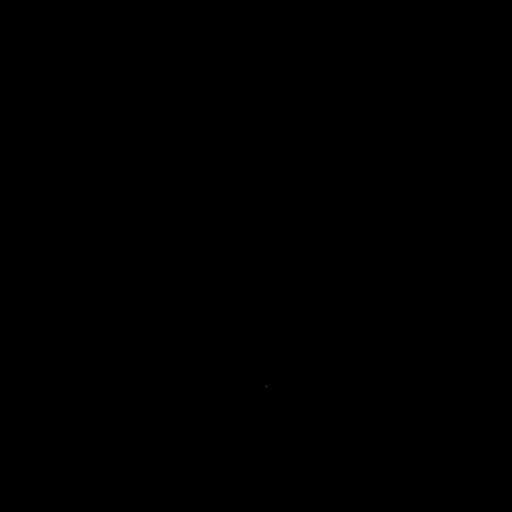

[16 of 30 positions shown; findings below may reference images not displayed]

FINDINGS: No skull fracture is noted. Paranasal sinuses the and mastoid air
cells are unremarkable.

No acute cortical infarction. No mass lesion is noted on this
unenhanced scan. Ventricular size is stable from prior exam. No
intra or extra-axial fluid collection. The gray and white-matter
differentiation is preserved.
IMPRESSION: No acute intracranial abnormality.

## 2016-04-03 IMAGING — CT CT ANGIO CHEST
2 of 6 series · 6 of 36 positions shown · IV contrast (Omnipaque 300)
Comparison: 01/16/2008

CLINICAL DATA: Syncopal episode, right chest pain

EXAM:
CT ANGIOGRAPHY CHEST WITH CONTRAST
TECHNIQUE: Multidetector CT imaging of the chest was performed using the
standard protocol during bolus administration of intravenous
contrast. Multiplanar CT image reconstructions and MIPs were
obtained to evaluate the vascular anatomy.
CONTRAST:  100mL OMNIPAQUE IOHEXOL 350 MG/ML SOLN

[Series 4: pe 3.0 b40f · axial · 0.62mm/px · z∈[-326,-152]mm · 5 of 88 slices shown]
[im 15/88  lung]
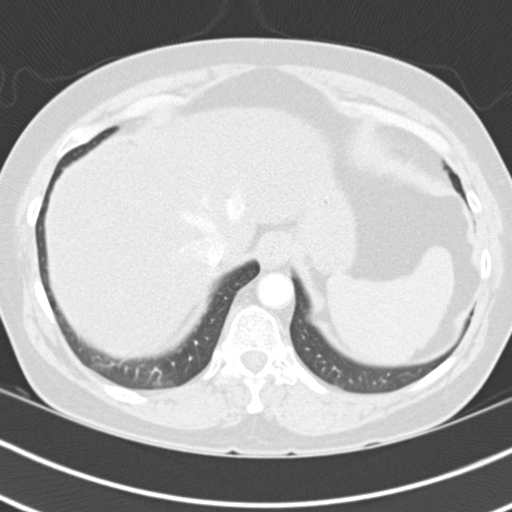
[im 30/88  mediastinal]
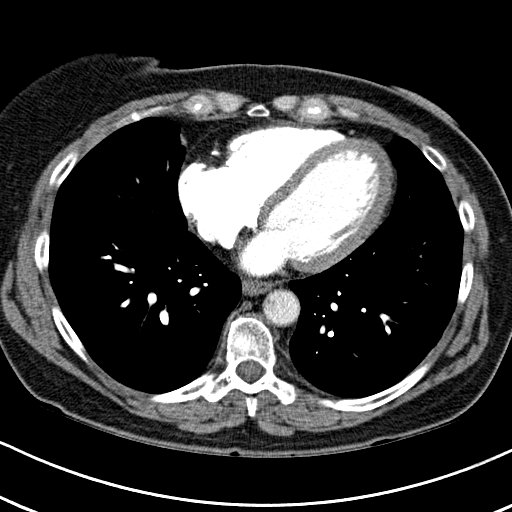
[im 44/88  lung]
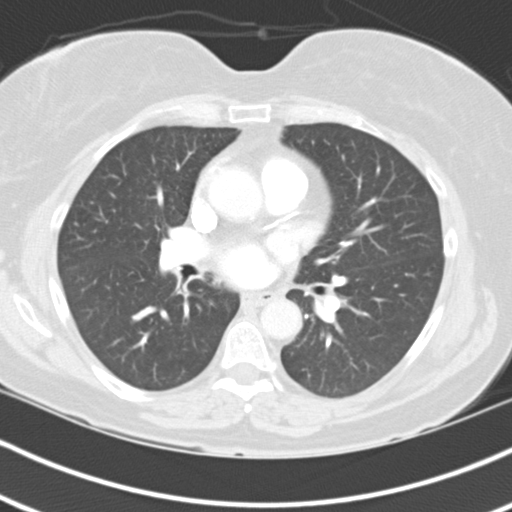
[im 59/88  mediastinal]
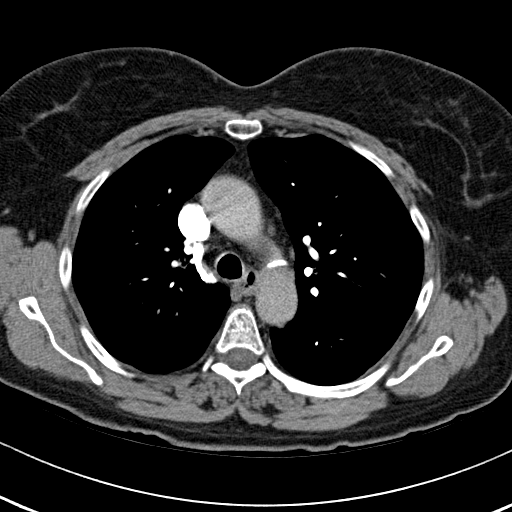
[im 73/88  lung]
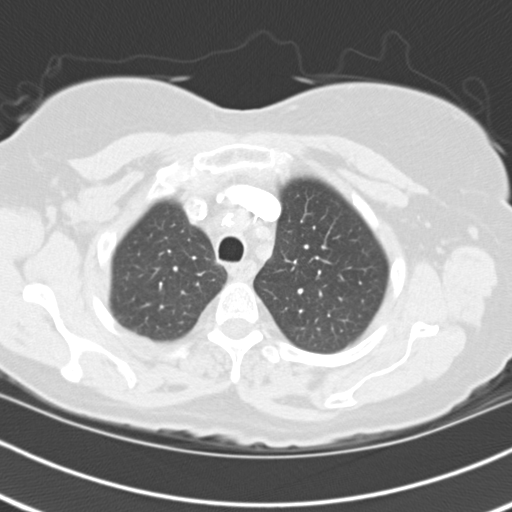

[Series 7: mpr coronal pe 3mm · coronal · 0.52mm/px · 1 of 77 slices shown]
[im 39/77  mediastinal]
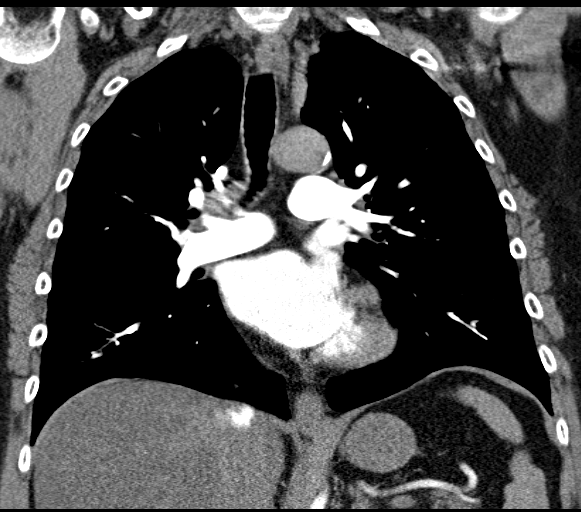

[6 of 36 positions shown; findings below may reference images not displayed]

FINDINGS: Pulmonary arteries are well visualized. No filling defect or
significant acute pulmonary embolus demonstrated by CTA. Thoracic
atherosclerosis noted. No mediastinal hemorrhage or hematoma. Normal
heart size. No pericardial or pleural effusion. Negative for hiatal
hernia. No adenopathy. Native coronary calcifications evident.

Included upper abdomen demonstrates mild fatty infiltration of the
liver. No acute upper abdominal findings.

Lung windows demonstrate small left apical subpleural blebs. No
acute airspace process, collapse or consolidation. No interstitial
process or edema. Negative for pneumothorax.

Progressive mid to lower thoracic spondylosis and degenerative
change noted. T9 superior endplate prominent Schmorl's node has
developed.

Review of the MIP images confirms the above findings.
IMPRESSION: No significant acute pulmonary embolus demonstrated by CTA.

No acute intra thoracic finding.

Progressive lower thoracic degenerative change.

Fatty infiltration of the liver

## 2016-04-24 DIAGNOSIS — G894 Chronic pain syndrome: Secondary | ICD-10-CM | POA: Diagnosis not present

## 2016-04-24 DIAGNOSIS — M79606 Pain in leg, unspecified: Secondary | ICD-10-CM | POA: Diagnosis not present

## 2016-04-24 DIAGNOSIS — M5416 Radiculopathy, lumbar region: Secondary | ICD-10-CM | POA: Diagnosis not present

## 2016-04-24 DIAGNOSIS — M961 Postlaminectomy syndrome, not elsewhere classified: Secondary | ICD-10-CM | POA: Diagnosis not present

## 2016-05-01 ENCOUNTER — Other Ambulatory Visit: Payer: Self-pay | Admitting: Physical Medicine and Rehabilitation

## 2016-05-01 DIAGNOSIS — M5136 Other intervertebral disc degeneration, lumbar region: Secondary | ICD-10-CM

## 2016-05-07 ENCOUNTER — Ambulatory Visit
Admission: RE | Admit: 2016-05-07 | Discharge: 2016-05-07 | Disposition: A | Discharge status: Home / Self Care | Payer: Self-pay | Source: Ambulatory Visit | Attending: Physical Medicine and Rehabilitation | Admitting: Physical Medicine and Rehabilitation

## 2016-05-07 DIAGNOSIS — M5136 Other intervertebral disc degeneration, lumbar region: Secondary | ICD-10-CM

## 2016-05-07 DIAGNOSIS — M5124 Other intervertebral disc displacement, thoracic region: Secondary | ICD-10-CM | POA: Diagnosis not present

## 2016-05-12 DIAGNOSIS — M961 Postlaminectomy syndrome, not elsewhere classified: Secondary | ICD-10-CM | POA: Diagnosis not present

## 2016-05-12 DIAGNOSIS — M5136 Other intervertebral disc degeneration, lumbar region: Secondary | ICD-10-CM | POA: Diagnosis not present

## 2016-05-15 DIAGNOSIS — J449 Chronic obstructive pulmonary disease, unspecified: Secondary | ICD-10-CM | POA: Diagnosis not present

## 2016-05-15 DIAGNOSIS — G894 Chronic pain syndrome: Secondary | ICD-10-CM | POA: Diagnosis not present

## 2016-05-15 DIAGNOSIS — H6692 Otitis media, unspecified, left ear: Secondary | ICD-10-CM | POA: Diagnosis not present

## 2016-05-15 DIAGNOSIS — Z23 Encounter for immunization: Secondary | ICD-10-CM | POA: Diagnosis not present

## 2016-05-19 ENCOUNTER — Other Ambulatory Visit (HOSPITAL_COMMUNITY): Payer: Self-pay | Admitting: *Deleted

## 2016-05-19 ENCOUNTER — Encounter (HOSPITAL_COMMUNITY)
Admission: RE | Admit: 2016-05-19 | Discharge: 2016-05-19 | Disposition: A | Discharge status: Home / Self Care | Payer: Medicare Other | Source: Ambulatory Visit | Attending: Orthopedic Surgery | Admitting: Orthopedic Surgery

## 2016-05-19 ENCOUNTER — Ambulatory Visit: Payer: Self-pay | Admitting: Physician Assistant

## 2016-05-19 ENCOUNTER — Encounter (HOSPITAL_COMMUNITY): Payer: Self-pay

## 2016-05-19 DIAGNOSIS — F329 Major depressive disorder, single episode, unspecified: Secondary | ICD-10-CM | POA: Diagnosis not present

## 2016-05-19 DIAGNOSIS — Z888 Allergy status to other drugs, medicaments and biological substances status: Secondary | ICD-10-CM | POA: Diagnosis not present

## 2016-05-19 DIAGNOSIS — Z981 Arthrodesis status: Secondary | ICD-10-CM | POA: Diagnosis not present

## 2016-05-19 DIAGNOSIS — M546 Pain in thoracic spine: Secondary | ICD-10-CM | POA: Diagnosis not present

## 2016-05-19 DIAGNOSIS — E785 Hyperlipidemia, unspecified: Secondary | ICD-10-CM | POA: Diagnosis not present

## 2016-05-19 DIAGNOSIS — F17210 Nicotine dependence, cigarettes, uncomplicated: Secondary | ICD-10-CM | POA: Diagnosis not present

## 2016-05-19 DIAGNOSIS — Z7982 Long term (current) use of aspirin: Secondary | ICD-10-CM | POA: Diagnosis not present

## 2016-05-19 DIAGNOSIS — F419 Anxiety disorder, unspecified: Secondary | ICD-10-CM | POA: Diagnosis not present

## 2016-05-19 DIAGNOSIS — G5783 Other specified mononeuropathies of bilateral lower limbs: Secondary | ICD-10-CM | POA: Diagnosis not present

## 2016-05-19 DIAGNOSIS — M542 Cervicalgia: Secondary | ICD-10-CM | POA: Diagnosis not present

## 2016-05-19 DIAGNOSIS — J449 Chronic obstructive pulmonary disease, unspecified: Secondary | ICD-10-CM | POA: Diagnosis not present

## 2016-05-19 DIAGNOSIS — M961 Postlaminectomy syndrome, not elsewhere classified: Secondary | ICD-10-CM | POA: Diagnosis not present

## 2016-05-19 DIAGNOSIS — E7800 Pure hypercholesterolemia, unspecified: Secondary | ICD-10-CM | POA: Diagnosis not present

## 2016-05-19 DIAGNOSIS — I10 Essential (primary) hypertension: Secondary | ICD-10-CM | POA: Diagnosis not present

## 2016-05-19 DIAGNOSIS — K589 Irritable bowel syndrome without diarrhea: Secondary | ICD-10-CM | POA: Diagnosis not present

## 2016-05-19 DIAGNOSIS — K760 Fatty (change of) liver, not elsewhere classified: Secondary | ICD-10-CM | POA: Diagnosis not present

## 2016-05-19 DIAGNOSIS — K219 Gastro-esophageal reflux disease without esophagitis: Secondary | ICD-10-CM | POA: Diagnosis not present

## 2016-05-19 DIAGNOSIS — I6523 Occlusion and stenosis of bilateral carotid arteries: Secondary | ICD-10-CM | POA: Diagnosis not present

## 2016-05-19 DIAGNOSIS — G894 Chronic pain syndrome: Secondary | ICD-10-CM | POA: Diagnosis not present

## 2016-05-19 DIAGNOSIS — I739 Peripheral vascular disease, unspecified: Secondary | ICD-10-CM | POA: Diagnosis not present

## 2016-05-19 LAB — CBC
HCT: 41.4 % (ref 36.0–46.0)
Hemoglobin: 14 g/dL (ref 12.0–15.0)
MCH: 31 pg (ref 26.0–34.0)
MCHC: 33.8 g/dL (ref 30.0–36.0)
MCV: 91.8 fL (ref 78.0–100.0)
Platelets: 283 10*3/uL (ref 150–400)
RBC: 4.51 MIL/uL (ref 3.87–5.11)
RDW: 13.9 % (ref 11.5–15.5)
WBC: 9.3 10*3/uL (ref 4.0–10.5)

## 2016-05-19 LAB — COMPREHENSIVE METABOLIC PANEL
ALK PHOS: 91 U/L (ref 38–126)
ALT: 16 U/L (ref 14–54)
ANION GAP: 11 (ref 5–15)
AST: 21 U/L (ref 15–41)
Albumin: 4 g/dL (ref 3.5–5.0)
BILIRUBIN TOTAL: 0.7 mg/dL (ref 0.3–1.2)
BUN: 5 mg/dL — ABNORMAL LOW (ref 6–20)
CALCIUM: 9.7 mg/dL (ref 8.9–10.3)
CO2: 30 mmol/L (ref 22–32)
Chloride: 96 mmol/L — ABNORMAL LOW (ref 101–111)
Creatinine, Ser: 0.77 mg/dL (ref 0.44–1.00)
GFR calc Af Amer: >60 mL/min (ref >60)
GFR calc non Af Amer: >60 mL/min (ref >60)
GLUCOSE: 89 mg/dL (ref 65–99)
Potassium: 3.1 mmol/L — ABNORMAL LOW (ref 3.5–5.1)
Sodium: 137 mmol/L (ref 135–145)
TOTAL PROTEIN: 7.2 g/dL (ref 6.5–8.1)

## 2016-05-19 LAB — SURGICAL PCR SCREEN
MRSA, PCR: NEGATIVE
Staphylococcus aureus: NEGATIVE

## 2016-05-19 NOTE — Pre-Procedure Instructions (Addendum)
[place]  05/19/2016    Your procedure is scheduled on Thursday, May 21, 2016 at 7:30 AM.   Report to [facility] Entrance "A" Admitting Office at 5:30 AM.   Call this number if you have problems the morning of surgery: [phone]   Questions prior to day of surgery, please call [phone] between 8 & 4 PM.   Remember:  Do not eat food or drink liquids after midnight Wednesday, 05/20/16.   Take these medicines the morning of surgery with A SIP OF WATER: Amlodipine (Norvasc), Tramadol - if needed  Do NOT smoke 24 hours prior to surgery.   Do not wear jewelry, make-up or nail polish.  Do not wear lotions, powders or perfumes.  Do not shave 48 hours prior to surgery.    Do not bring valuables to the hospital.  [facility] is not responsible for any belongings or valuables.  Contacts, dentures or bridgework may not be worn into surgery.  Leave your suitcase in the car.  After surgery it may be brought to your room.  For patients admitted to the hospital, discharge time will be determined by your treatment team.  Patients discharged the day of surgery will not be allowed to drive home.   Special instructions:  [facility] - Preparing for Surgery  Before surgery, you can play an important role.  Because skin is not sterile, your skin needs to be as free of germs as possible.  You can reduce the number of germs on you skin by washing with CHG (chlorahexidine gluconate) soap before surgery.  CHG is an antiseptic cleaner which kills germs and bonds with the skin to continue killing germs even after washing.  Please DO NOT use if you have an allergy to CHG or antibacterial soaps.  If your skin becomes reddened/irritated stop using the CHG and inform your nurse when you arrive at Short Stay.  Do not shave (including legs and underarms) for at least 48 hours prior to the first CHG shower.  You may shave your face.  Please follow these instructions carefully:   1.   Shower with CHG Soap the night before surgery and the                    morning of Surgery.  2.  If you choose to wash your hair, wash your hair first as usual with your       normal shampoo.  3.  After you shampoo, rinse your hair and body thoroughly to remove the shampoo.  4.  Use CHG as you would any other liquid soap.  You can apply chg directly       to the skin and wash gently with scrungie or a clean washcloth.  5.  Apply the CHG Soap to your body ONLY FROM THE NECK DOWN.        Do not use on open wounds or open sores.  Avoid contact with your eyes, ears, mouth and genitals (private parts).  Wash genitals (private parts) with your normal soap.  6.  Wash thoroughly, paying special attention to the area where your surgery        will be performed.  7.  Thoroughly rinse your body with warm water from the neck down.  8.  DO NOT shower/wash with your normal soap after using and rinsing off       the CHG Soap.  9.  Pat yourself dry with a clean towel.  10.  Wear clean pajamas.            11.  Place clean sheets on your bed the night of your first shower and do not        sleep with pets.  Day of Surgery  Do not apply any lotions the morning of surgery.  Please wear clean clothes to the hospital.   Please read over the fact sheets that you were given.

## 2016-05-19 NOTE — Progress Notes (Signed)
Pt denies cardiac history, chest pain or sob. Pt states she is not diabetic. 
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
## 2016-05-21 ENCOUNTER — Ambulatory Visit (HOSPITAL_COMMUNITY): Payer: Medicare Other

## 2016-05-21 ENCOUNTER — Observation Stay (HOSPITAL_COMMUNITY)
Admission: RE | Admit: 2016-05-21 | Discharge: 2016-05-22 | Disposition: A | Discharge status: Home / Self Care | Payer: Medicare Other | Source: Ambulatory Visit | Attending: Orthopedic Surgery | Admitting: Orthopedic Surgery

## 2016-05-21 ENCOUNTER — Ambulatory Visit (HOSPITAL_COMMUNITY): Payer: Medicare Other | Admitting: Vascular Surgery

## 2016-05-21 ENCOUNTER — Encounter (HOSPITAL_COMMUNITY)
Admission: RE | Disposition: A | Discharge status: Home / Self Care | Payer: Self-pay | Source: Ambulatory Visit | Attending: Orthopedic Surgery

## 2016-05-21 ENCOUNTER — Encounter (HOSPITAL_COMMUNITY): Payer: Self-pay | Admitting: Urology

## 2016-05-21 DIAGNOSIS — F329 Major depressive disorder, single episode, unspecified: Secondary | ICD-10-CM | POA: Insufficient documentation

## 2016-05-21 DIAGNOSIS — M961 Postlaminectomy syndrome, not elsewhere classified: Secondary | ICD-10-CM | POA: Insufficient documentation

## 2016-05-21 DIAGNOSIS — I739 Peripheral vascular disease, unspecified: Secondary | ICD-10-CM | POA: Insufficient documentation

## 2016-05-21 DIAGNOSIS — G5783 Other specified mononeuropathies of bilateral lower limbs: Secondary | ICD-10-CM | POA: Insufficient documentation

## 2016-05-21 DIAGNOSIS — K219 Gastro-esophageal reflux disease without esophagitis: Secondary | ICD-10-CM | POA: Insufficient documentation

## 2016-05-21 DIAGNOSIS — M546 Pain in thoracic spine: Secondary | ICD-10-CM | POA: Diagnosis not present

## 2016-05-21 DIAGNOSIS — I10 Essential (primary) hypertension: Secondary | ICD-10-CM | POA: Diagnosis not present

## 2016-05-21 DIAGNOSIS — M5442 Lumbago with sciatica, left side: Secondary | ICD-10-CM | POA: Diagnosis not present

## 2016-05-21 DIAGNOSIS — Z981 Arthrodesis status: Secondary | ICD-10-CM | POA: Diagnosis not present

## 2016-05-21 DIAGNOSIS — Z419 Encounter for procedure for purposes other than remedying health state, unspecified: Secondary | ICD-10-CM

## 2016-05-21 DIAGNOSIS — I6523 Occlusion and stenosis of bilateral carotid arteries: Secondary | ICD-10-CM | POA: Diagnosis not present

## 2016-05-21 DIAGNOSIS — M5441 Lumbago with sciatica, right side: Secondary | ICD-10-CM | POA: Diagnosis not present

## 2016-05-21 DIAGNOSIS — G894 Chronic pain syndrome: Principal | ICD-10-CM | POA: Insufficient documentation

## 2016-05-21 DIAGNOSIS — E785 Hyperlipidemia, unspecified: Secondary | ICD-10-CM | POA: Diagnosis not present

## 2016-05-21 DIAGNOSIS — K760 Fatty (change of) liver, not elsewhere classified: Secondary | ICD-10-CM | POA: Insufficient documentation

## 2016-05-21 DIAGNOSIS — Z888 Allergy status to other drugs, medicaments and biological substances status: Secondary | ICD-10-CM | POA: Diagnosis not present

## 2016-05-21 DIAGNOSIS — M549 Dorsalgia, unspecified: Secondary | ICD-10-CM | POA: Diagnosis present

## 2016-05-21 DIAGNOSIS — Z7982 Long term (current) use of aspirin: Secondary | ICD-10-CM | POA: Insufficient documentation

## 2016-05-21 DIAGNOSIS — J449 Chronic obstructive pulmonary disease, unspecified: Secondary | ICD-10-CM | POA: Diagnosis not present

## 2016-05-21 DIAGNOSIS — K589 Irritable bowel syndrome without diarrhea: Secondary | ICD-10-CM | POA: Diagnosis not present

## 2016-05-21 DIAGNOSIS — M542 Cervicalgia: Secondary | ICD-10-CM | POA: Insufficient documentation

## 2016-05-21 DIAGNOSIS — F419 Anxiety disorder, unspecified: Secondary | ICD-10-CM | POA: Insufficient documentation

## 2016-05-21 DIAGNOSIS — E7800 Pure hypercholesterolemia, unspecified: Secondary | ICD-10-CM | POA: Diagnosis not present

## 2016-05-21 DIAGNOSIS — Z462 Encounter for fitting and adjustment of other devices related to nervous system and special senses: Secondary | ICD-10-CM | POA: Diagnosis not present

## 2016-05-21 DIAGNOSIS — G8929 Other chronic pain: Secondary | ICD-10-CM | POA: Diagnosis not present

## 2016-05-21 DIAGNOSIS — F17210 Nicotine dependence, cigarettes, uncomplicated: Secondary | ICD-10-CM | POA: Insufficient documentation

## 2016-05-21 HISTORY — PX: SPINAL CORD STIMULATOR INSERTION: SHX5378

## 2016-05-21 SURGERY — INSERTION, SPINAL CORD STIMULATOR, LUMBAR
Anesthesia: General | Site: Spine Lumbar

## 2016-05-21 MED ORDER — PROPOFOL 10 MG/ML IV BOLUS
INTRAVENOUS | Status: DC | PRN
  Administered 2016-05-21: 150 mg via INTRAVENOUS
  Administered 2016-05-21 (×2): 50 mg via INTRAVENOUS

## 2016-05-21 MED ORDER — METOPROLOL TARTARATE 1 MG/ML SYRINGE (5ML)
Status: DC | PRN
  Administered 2016-05-21: 2.5 mg via INTRAVENOUS

## 2016-05-21 MED ORDER — 0.9 % SODIUM CHLORIDE (POUR BTL) OPTIME
TOPICAL | Status: DC | PRN
  Administered 2016-05-21: 1000 mL

## 2016-05-21 MED ORDER — ACETAMINOPHEN 10 MG/ML IV SOLN
INTRAVENOUS | Status: AC
  Filled 2016-05-21: qty 100

## 2016-05-21 MED ORDER — AMLODIPINE BESYLATE 10 MG PO TABS
10.0000 mg | ORAL_TABLET | Freq: Every day | ORAL | Status: DC
  Administered 2016-05-22: 10 mg via ORAL
  Filled 2016-05-21: qty 1

## 2016-05-21 MED ORDER — ACETAMINOPHEN 10 MG/ML IV SOLN
1000.0000 mg | Freq: Four times a day (QID) | INTRAVENOUS | Status: AC
  Administered 2016-05-21 – 2016-05-22 (×4): 1000 mg via INTRAVENOUS
  Filled 2016-05-21 (×4): qty 100

## 2016-05-21 MED ORDER — LIDOCAINE HCL (CARDIAC) 20 MG/ML IV SOLN
INTRAVENOUS | Status: DC | PRN
  Administered 2016-05-21: 60 mg via INTRATRACHEAL

## 2016-05-21 MED ORDER — THROMBIN 20000 UNITS EX SOLR
CUTANEOUS | Status: AC
  Filled 2016-05-21: qty 20000

## 2016-05-21 MED ORDER — SUGAMMADEX SODIUM 200 MG/2ML IV SOLN
INTRAVENOUS | Status: DC | PRN
  Administered 2016-05-21: 200 mg via INTRAVENOUS

## 2016-05-21 MED ORDER — SODIUM CHLORIDE 0.9 % IV SOLN: 250.0000 mL | INTRAVENOUS | Status: DC

## 2016-05-21 MED ORDER — ONDANSETRON HCL 4 MG/2ML IJ SOLN
INTRAMUSCULAR | Status: DC | PRN
  Administered 2016-05-21: 4 mg via INTRAVENOUS

## 2016-05-21 MED ORDER — ACETAMINOPHEN 10 MG/ML IV SOLN
INTRAVENOUS | Status: DC | PRN
  Administered 2016-05-21: 1000 mg via INTRAVENOUS

## 2016-05-21 MED ORDER — BUPIVACAINE-EPINEPHRINE (PF) 0.5% -1:200000 IJ SOLN
INTRAMUSCULAR | Status: DC | PRN
  Administered 2016-05-21: 10 mL

## 2016-05-21 MED ORDER — ONDANSETRON HCL 4 MG PO TABS: 4.0000 mg | ORAL_TABLET | Freq: Three times a day (TID) | ORAL | 0 refills | Status: DC | PRN

## 2016-05-21 MED ORDER — PHENOL 1.4 % MT LIQD
1.0000 | OROMUCOSAL | Status: DC | PRN
Start: 2016-05-21 — End: 2016-05-22

## 2016-05-21 MED ORDER — HYDROCHLOROTHIAZIDE 25 MG PO TABS
25.0000 mg | ORAL_TABLET | Freq: Every day | ORAL | Status: DC
  Administered 2016-05-21 – 2016-05-22 (×2): 25 mg via ORAL
  Filled 2016-05-21 (×2): qty 1

## 2016-05-21 MED ORDER — LISINOPRIL 20 MG PO TABS
20.0000 mg | ORAL_TABLET | Freq: Every day | ORAL | Status: DC
  Administered 2016-05-21 – 2016-05-22 (×2): 20 mg via ORAL
  Filled 2016-05-21 (×2): qty 1

## 2016-05-21 MED ORDER — DEXAMETHASONE SODIUM PHOSPHATE 4 MG/ML IJ SOLN: 4.0000 mg | Freq: Four times a day (QID) | INTRAMUSCULAR | Status: AC

## 2016-05-21 MED ORDER — OXYCODONE HCL 5 MG PO TABS
ORAL_TABLET | ORAL | Status: AC
  Filled 2016-05-21: qty 2

## 2016-05-21 MED ORDER — OXYCODONE HCL 5 MG PO TABS
10.0000 mg | ORAL_TABLET | ORAL | Status: DC | PRN
  Administered 2016-05-21 – 2016-05-22 (×6): 10 mg via ORAL
  Filled 2016-05-21 (×5): qty 2

## 2016-05-21 MED ORDER — ROCURONIUM BROMIDE 100 MG/10ML IV SOLN
INTRAVENOUS | Status: DC | PRN
  Administered 2016-05-21: 40 mg via INTRAVENOUS

## 2016-05-21 MED ORDER — SODIUM CHLORIDE 0.9% FLUSH
3.0000 mL | Freq: Two times a day (BID) | INTRAVENOUS | Status: DC
  Administered 2016-05-21: 3 mL via INTRAVENOUS

## 2016-05-21 MED ORDER — HYDROMORPHONE HCL 1 MG/ML IJ SOLN
0.2500 mg | INTRAMUSCULAR | Status: DC | PRN
  Administered 2016-05-21 (×3): 0.5 mg via INTRAVENOUS

## 2016-05-21 MED ORDER — PHENYLEPHRINE HCL 10 MG/ML IJ SOLN
INTRAVENOUS | Status: DC | PRN
  Administered 2016-05-21: 25 ug/min via INTRAVENOUS

## 2016-05-21 MED ORDER — GLYCOPYRROLATE 0.2 MG/ML IJ SOLN
INTRAMUSCULAR | Status: DC | PRN
  Administered 2016-05-21 (×2): 0.1 mg via INTRAVENOUS

## 2016-05-21 MED ORDER — PROMETHAZINE HCL 25 MG/ML IJ SOLN: 6.2500 mg | INTRAMUSCULAR | Status: DC | PRN

## 2016-05-21 MED ORDER — LACTATED RINGERS IV SOLN
INTRAVENOUS | Status: DC | PRN
  Administered 2016-05-21 (×3): via INTRAVENOUS

## 2016-05-21 MED ORDER — EPHEDRINE SULFATE 50 MG/ML IJ SOLN
INTRAMUSCULAR | Status: DC | PRN
  Administered 2016-05-21 (×2): 10 mg via INTRAVENOUS

## 2016-05-21 MED ORDER — LIDOCAINE 2% (20 MG/ML) 5 ML SYRINGE
INTRAMUSCULAR | Status: AC
  Filled 2016-05-21: qty 5

## 2016-05-21 MED ORDER — THROMBIN 20000 UNITS EX SOLR
CUTANEOUS | Status: DC | PRN
  Administered 2016-05-21: 20 mL via TOPICAL

## 2016-05-21 MED ORDER — FENTANYL CITRATE (PF) 100 MCG/2ML IJ SOLN
INTRAMUSCULAR | Status: AC
  Filled 2016-05-21: qty 2

## 2016-05-21 MED ORDER — METHOCARBAMOL 1000 MG/10ML IJ SOLN
500.0000 mg | Freq: Four times a day (QID) | INTRAVENOUS | Status: DC | PRN
  Filled 2016-05-21: qty 5

## 2016-05-21 MED ORDER — SODIUM CHLORIDE 0.9% FLUSH: 3.0000 mL | INTRAVENOUS | Status: DC | PRN

## 2016-05-21 MED ORDER — ARTIFICIAL TEARS OP OINT
TOPICAL_OINTMENT | OPHTHALMIC | Status: AC
Start: 2016-05-21 — End: 2016-05-21
  Filled 2016-05-21: qty 3.5

## 2016-05-21 MED ORDER — HEMOSTATIC AGENTS (NO CHARGE) OPTIME
TOPICAL | Status: DC | PRN
  Administered 2016-05-21: 1 via TOPICAL

## 2016-05-21 MED ORDER — MORPHINE SULFATE (PF) 2 MG/ML IV SOLN: 2.0000 mg | INTRAVENOUS | Status: DC | PRN

## 2016-05-21 MED ORDER — CEFAZOLIN SODIUM-DEXTROSE 2-4 GM/100ML-% IV SOLN
2.0000 g | INTRAVENOUS | Status: AC
  Administered 2016-05-21: 2 g via INTRAVENOUS

## 2016-05-21 MED ORDER — MIDAZOLAM HCL 2 MG/2ML IJ SOLN
INTRAMUSCULAR | Status: DC | PRN
  Administered 2016-05-21 (×2): 1 mg via INTRAVENOUS

## 2016-05-21 MED ORDER — MORPHINE SULFATE (PF) 4 MG/ML IV SOLN
2.0000 mg | INTRAVENOUS | Status: DC | PRN
  Administered 2016-05-21: 2 mg via INTRAVENOUS
  Filled 2016-05-21: qty 1

## 2016-05-21 MED ORDER — MIDAZOLAM HCL 2 MG/2ML IJ SOLN
INTRAMUSCULAR | Status: AC
  Filled 2016-05-21: qty 2

## 2016-05-21 MED ORDER — CEFAZOLIN SODIUM-DEXTROSE 2-4 GM/100ML-% IV SOLN
2.0000 g | Freq: Three times a day (TID) | INTRAVENOUS | Status: AC
  Administered 2016-05-21 (×2): 2 g via INTRAVENOUS
  Filled 2016-05-21 (×2): qty 100

## 2016-05-21 MED ORDER — HYDROMORPHONE HCL 1 MG/ML IJ SOLN
INTRAMUSCULAR | Status: AC
  Administered 2016-05-21: 0.5 mg via INTRAVENOUS
  Filled 2016-05-21: qty 1

## 2016-05-21 MED ORDER — METHOCARBAMOL 500 MG PO TABS: 500.0000 mg | ORAL_TABLET | Freq: Three times a day (TID) | ORAL | 0 refills | Status: DC | PRN

## 2016-05-21 MED ORDER — METHOCARBAMOL 500 MG PO TABS
500.0000 mg | ORAL_TABLET | Freq: Four times a day (QID) | ORAL | Status: DC | PRN
  Administered 2016-05-21 – 2016-05-22 (×3): 500 mg via ORAL
  Filled 2016-05-21 (×2): qty 1

## 2016-05-21 MED ORDER — PHENYLEPHRINE HCL 10 MG/ML IJ SOLN
INTRAMUSCULAR | Status: DC | PRN
  Administered 2016-05-21: 120 ug via INTRAVENOUS
  Administered 2016-05-21 (×2): 80 ug via INTRAVENOUS
  Administered 2016-05-21: 120 ug via INTRAVENOUS

## 2016-05-21 MED ORDER — DEXAMETHASONE SODIUM PHOSPHATE 10 MG/ML IJ SOLN
INTRAMUSCULAR | Status: DC | PRN
  Administered 2016-05-21: 10 mg via INTRAVENOUS

## 2016-05-21 MED ORDER — BUPIVACAINE-EPINEPHRINE (PF) 0.5% -1:200000 IJ SOLN
INTRAMUSCULAR | Status: AC
  Filled 2016-05-21: qty 30

## 2016-05-21 MED ORDER — PHENYLEPHRINE 40 MCG/ML (10ML) SYRINGE FOR IV PUSH (FOR BLOOD PRESSURE SUPPORT)
PREFILLED_SYRINGE | INTRAVENOUS | Status: AC
  Filled 2016-05-21: qty 10

## 2016-05-21 MED ORDER — HYDROMORPHONE HCL 1 MG/ML IJ SOLN
INTRAMUSCULAR | Status: AC
  Filled 2016-05-21: qty 0.5

## 2016-05-21 MED ORDER — POLYETHYLENE GLYCOL 3350 17 G PO PACK: 17.0000 g | PACK | Freq: Every day | ORAL | Status: DC | PRN

## 2016-05-21 MED ORDER — LISINOPRIL-HYDROCHLOROTHIAZIDE 20-25 MG PO TABS: 1.0000 | ORAL_TABLET | Freq: Every day | ORAL | Status: DC

## 2016-05-21 MED ORDER — OXYCODONE-ACETAMINOPHEN 10-325 MG PO TABS: 1.0000 | ORAL_TABLET | ORAL | 0 refills | Status: DC | PRN

## 2016-05-21 MED ORDER — MENTHOL 3 MG MT LOZG: 1.0000 | LOZENGE | OROMUCOSAL | Status: DC | PRN

## 2016-05-21 MED ORDER — DEXAMETHASONE 4 MG PO TABS
4.0000 mg | ORAL_TABLET | Freq: Four times a day (QID) | ORAL | Status: AC
  Administered 2016-05-21 – 2016-05-22 (×3): 4 mg via ORAL
  Filled 2016-05-21 (×3): qty 1

## 2016-05-21 MED ORDER — LACTATED RINGERS IV SOLN: INTRAVENOUS | Status: DC

## 2016-05-21 MED ORDER — CEFAZOLIN SODIUM-DEXTROSE 2-4 GM/100ML-% IV SOLN
INTRAVENOUS | Status: AC
  Filled 2016-05-21: qty 100

## 2016-05-21 MED ORDER — METHOCARBAMOL 500 MG PO TABS
ORAL_TABLET | ORAL | Status: AC
  Filled 2016-05-21: qty 1

## 2016-05-21 MED ORDER — FENTANYL CITRATE (PF) 100 MCG/2ML IJ SOLN
INTRAMUSCULAR | Status: DC | PRN
  Administered 2016-05-21: 100 ug via INTRAVENOUS

## 2016-05-21 MED ORDER — PROPOFOL 10 MG/ML IV BOLUS
INTRAVENOUS | Status: AC
  Filled 2016-05-21: qty 20

## 2016-05-21 MED ORDER — ROCURONIUM BROMIDE 50 MG/5ML IV SOSY
PREFILLED_SYRINGE | INTRAVENOUS | Status: AC
Start: 2016-05-21 — End: 2016-05-21
  Filled 2016-05-21: qty 5

## 2016-05-21 MED ORDER — ONDANSETRON HCL 4 MG/2ML IJ SOLN: 4.0000 mg | INTRAMUSCULAR | Status: DC | PRN

## 2016-05-21 MED ORDER — ONDANSETRON HCL 4 MG/2ML IJ SOLN
INTRAMUSCULAR | Status: AC
  Filled 2016-05-21: qty 2

## 2016-05-21 SURGICAL SUPPLY — 57 items
CANISTER SUCTION 2500CC (MISCELLANEOUS) ×3 IMPLANT
CLOSURE STERI-STRIP 1/2X4 (GAUZE/BANDAGES/DRESSINGS) ×2
CLSR STERI-STRIP ANTIMIC 1/2X4 (GAUZE/BANDAGES/DRESSINGS) ×4 IMPLANT
CONTROLLER PROCLAIM IPG E5 PAT (Neuro Prosthesis/Implant) ×3 IMPLANT
COVER PROBE W GEL 5X96 (DRAPES) IMPLANT
COVER SURGICAL LIGHT HANDLE (MISCELLANEOUS) ×3 IMPLANT
DRAPE C-ARM 42X72 X-RAY (DRAPES) ×3 IMPLANT
DRAPE INCISE IOBAN 85X60 (DRAPES) ×3 IMPLANT
DRAPE SURG 17X23 STRL (DRAPES) ×3 IMPLANT
DRAPE U-SHAPE 47X51 STRL (DRAPES) ×3 IMPLANT
DRSG AQUACEL AG ADV 3.5X 6 (GAUZE/BANDAGES/DRESSINGS) ×6 IMPLANT
DURAPREP 26ML APPLICATOR (WOUND CARE) ×3 IMPLANT
ELECT BLADE 4.0 EZ CLEAN MEGAD (MISCELLANEOUS) ×3
ELECT CAUTERY BLADE 6.4 (BLADE) ×3 IMPLANT
ELECT PENCIL ROCKER SW 15FT (MISCELLANEOUS) ×3 IMPLANT
ELECT REM PT RETURN 9FT ADLT (ELECTROSURGICAL) ×3
ELECTRODE BLDE 4.0 EZ CLN MEGD (MISCELLANEOUS) ×1 IMPLANT
ELECTRODE REM PT RTRN 9FT ADLT (ELECTROSURGICAL) ×1 IMPLANT
GLOVE BIO SURGEON STRL SZ7 (GLOVE) ×3 IMPLANT
GLOVE BIOGEL PI IND STRL 7.0 (GLOVE) ×1 IMPLANT
GLOVE BIOGEL PI IND STRL 8.5 (GLOVE) ×1 IMPLANT
GLOVE BIOGEL PI INDICATOR 7.0 (GLOVE) ×2
GLOVE BIOGEL PI INDICATOR 8.5 (GLOVE) ×2
GLOVE SS N UNI LF 8.5 STRL (GLOVE) ×6 IMPLANT
GOWN STRL REUS W/ TWL LRG LVL3 (GOWN DISPOSABLE) ×2 IMPLANT
GOWN STRL REUS W/TWL 2XL LVL3 (GOWN DISPOSABLE) ×3 IMPLANT
GOWN STRL REUS W/TWL LRG LVL3 (GOWN DISPOSABLE) ×4
KIT BASIN OR (CUSTOM PROCEDURE TRAY) ×3 IMPLANT
KIT ROOM TURNOVER OR (KITS) ×3 IMPLANT
LAMI NARROW PRIPOLE 16CH (Neuro Prosthesis/Implant) ×3 IMPLANT
NEEDLE 22X1 1/2 (OR ONLY) (NEEDLE) ×3 IMPLANT
NEEDLE SPNL 18GX3.5 QUINCKE PK (NEEDLE) ×9 IMPLANT
NS IRRIG 1000ML POUR BTL (IV SOLUTION) ×3 IMPLANT
PACK LAMINECTOMY ORTHO (CUSTOM PROCEDURE TRAY) ×3 IMPLANT
PACK UNIVERSAL I (CUSTOM PROCEDURE TRAY) ×3 IMPLANT
PAD ARMBOARD 7.5X6 YLW CONV (MISCELLANEOUS) ×6 IMPLANT
SPATULA SILICONE BRAIN 10MM (MISCELLANEOUS) IMPLANT
SPONGE LAP 4X18 X RAY DECT (DISPOSABLE) IMPLANT
SPONGE SURGIFOAM ABS GEL 100 (HEMOSTASIS) ×3 IMPLANT
STAPLER VISISTAT 35W (STAPLE) ×6 IMPLANT
SURGIFLO W/THROMBIN 8M KIT (HEMOSTASIS) ×3 IMPLANT
SUT BONE WAX W31G (SUTURE) ×3 IMPLANT
SUT ETHIBOND NAB CT1 #1 30IN (SUTURE) ×3 IMPLANT
SUT FIBERWIRE #2 38 T-5 BLUE (SUTURE) ×3
SUT MNCRL AB 3-0 PS2 18 (SUTURE) ×6 IMPLANT
SUT VIC AB 1 CT1 18XCR BRD 8 (SUTURE) ×2 IMPLANT
SUT VIC AB 1 CT1 8-18 (SUTURE) ×4
SUT VIC AB 2-0 CT1 18 (SUTURE) ×6 IMPLANT
SUTURE FIBERWR #2 38 T-5 BLUE (SUTURE) ×1 IMPLANT
SYR BULB IRRIGATION 50ML (SYRINGE) ×3 IMPLANT
SYR CONTROL 10ML LL (SYRINGE) ×3 IMPLANT
TOOL TUNNELING 20 (MISCELLANEOUS) ×3 IMPLANT
TOWEL OR 17X24 6PK STRL BLUE (TOWEL DISPOSABLE) ×3 IMPLANT
TOWEL OR 17X26 10 PK STRL BLUE (TOWEL DISPOSABLE) ×3 IMPLANT
TRAY CATH 16FR W/PLASTIC CATH (SET/KITS/TRAYS/PACK) ×3 IMPLANT
TRAY FOLEY CATH 16FRSI W/METER (SET/KITS/TRAYS/PACK) IMPLANT
WATER STERILE IRR 1000ML POUR (IV SOLUTION) IMPLANT

## 2016-05-21 NOTE — OR Nursing (Signed)
0940:  In&out cath=400cyu, per protocol, no trauma

## 2016-05-21 NOTE — Anesthesia Postprocedure Evaluation (Addendum)
Anesthesia Post Note  Patient: [name]  Procedure(s) Performed: Procedure(s) (LRB): LUMBAR SPINAL CORD STIMULATOR INSERTION (N/A)  Patient location during evaluation: PACU Anesthesia Type: General Level of consciousness: awake and sedated Pain management: pain level controlled Vital Signs Assessment: post-procedure vital signs reviewed and stable Respiratory status: spontaneous breathing, nonlabored ventilation, respiratory function stable and patient connected to nasal cannula oxygen Cardiovascular status: blood pressure returned to baseline and stable Postop Assessment: no signs of nausea or vomiting Anesthetic complications: no       Last Vitals:  Vitals:   05/21/16 1030 05/21/16 1045  BP: 107/60   Pulse: 65 69  Resp: 13 12  Temp:      Last Pain:  Vitals:   05/21/16 0624  TempSrc: Oral  PainSc:                  Analisse Randle,JAMES TERRILL

## 2016-05-21 NOTE — Op Note (Signed)
Kaitlyn, Patterson             ACCOUNT NO.:  [id number]  MEDICAL RECORD NO.:  [id number]  LOCATION:  [facility]                         FACILITY:  [facility]  PHYSICIAN:  Ruthell Feigenbaum D. Rolena Infante, M.D. DATE OF BIRTH:  03/08/1956  DATE OF PROCEDURE:  05/21/2016 DATE OF DISCHARGE:                              OPERATIVE REPORT   PREOPERATIVE DIAGNOSIS:  Failed back syndrome with debilitating back, buttock, and bilateral neuropathic leg pain, right worse than left.  POSTOPERATIVE DIAGNOSIS:  Failed back syndrome with debilitating back, buttock, and bilateral neuropathic leg pain, right worse than left.  OPERATIVE PROCEDURE:  Implantation of spinal cord stimulator.  This is a Tripole lead placed from [place].  FIRST ASSISTANT:  [place], [place].  COMPLICATIONS:  None.  CONDITION:  Stable.  HISTORY:  This is a very pleasant woman with a previous spinal fusion several years ago and has had persistent pain.  Attempts at conservative management failed to alleviate her symptoms and so we elected to proceed with surgery.  All appropriate risks, benefits, and alternatives to surgery were discussed with the patient and consent was obtained.  OPERATIVE NOTE:  The patient was brought to the operating room and placed supine on the operating table.  After successful induction of general anesthesia and endotracheal intubation, TEDs and SCDs were applied and she was turned prone onto the Wilson frame.  All bony prominences were well padded, and the back was prepped and draped in standard fashion.  Time-out was taken confirming patient, procedure, and all other pertinent important data.  Once this was done using AP fluoro, identified the 12 rib-bearing vertebrae and counted up to the T9-10 disk space.  I marked out my incision site and infiltrated with 0.25% Marcaine.  Midline incision was made.  Sharp dissection was carried out down to the deep fascia.  I exposed the spinous process of T9, T10,  and T11.  I then confirmed with x-ray the T9 pedicle and the T9-10 intervertebral space.  Once this was done, I then removed the majority of the T9 spinous process.  I then used a micro nerve curette to develop a plane underneath the lamina.  A 2 mm Kerrison punch was used to perform a laminotomy of T9.  I then dissected through the central raphe of the ligamentum flavum and exposed the thecal sac.  I then removed the overhanging ligamentum flavum to completely expose the dorsal surface of the thecal sac.  At this point, I used my trial device, which easily passed up inside to obtain the Tripole lead and advanced it to the appropriate position.  We made sure spanning from the entire T8 vertebral body slightly to the right as requested by the rep, this was where maximum programming occurred.  Once the lead was properly positioned, I sutured it directly to the T10 spinous process using Ethibond suture.  I then made a right-sided gluteal incision, created a cavity approximately 4 cm deep and then used the submuscular passer to bring the wires from the thoracic wound to the gluteal wound.  I connected them to the battery, torqued off the screws.  They were locked in place and then secured the battery to the deep fascia.  The entire system was then tested by the rep and it was functioning without any issues.  Both wounds were copiously irrigated with normal saline and then closed in a layered fashion with interrupted #1 Vicryl sutures, 2-0 Vicryl sutures, and 3-0 Monocryl.  Steri-Strips and dry dressing were applied and the patient was ultimately extubated, transferred to the PACU without incident.  At the end of the case, all needle and sponge counts were correct.  There were no adverse intraoperative events.     Dillard Pascal D. Rolena Infante, M.D.     DDB/MEDQ  D:  05/21/2016  T:  05/21/2016  Job:  [id number]

## 2016-05-21 NOTE — Anesthesia Preprocedure Evaluation (Addendum)
Anesthesia Evaluation  Patient identified by MRN, date of birth, ID band Patient awake    Reviewed: Allergy & Precautions, NPO status , Patient's Chart, lab work & pertinent test results  Airway Mallampati: II  TM Distance: >3 FB Neck ROM: Full    Dental  (+) Edentulous Upper, Edentulous Lower   Pulmonary COPD, Current Smoker,    breath sounds clear to auscultation       Cardiovascular hypertension, + Peripheral Vascular Disease   Rhythm:Regular Rate:Normal     Neuro/Psych  Headaches, TIA Neuromuscular disease    GI/Hepatic GERD  ,  Endo/Other  negative endocrine ROS  Renal/GU negative Renal ROS     Musculoskeletal   Abdominal   Peds  Hematology   Anesthesia Other Findings   Reproductive/Obstetrics                            Anesthesia Physical Anesthesia Plan  ASA: II  Anesthesia Plan: General   Post-op Pain Management:    Induction: Intravenous  Airway Management Planned: Oral ETT  Additional Equipment:   Intra-op Plan:   Post-operative Plan: Extubation in OR  Informed Consent: I have reviewed the patients History and Physical, chart, labs and discussed the procedure including the risks, benefits and alternatives for the proposed anesthesia with the patient or authorized representative who has indicated his/her understanding and acceptance.   Dental advisory given  Plan Discussed with:   Anesthesia Plan Comments:         Anesthesia Quick Evaluation

## 2016-05-21 NOTE — Brief Op Note (Signed)
05/21/2016  10:03 AM  PATIENT:  [name]  61 y.o. female  PRE-OPERATIVE DIAGNOSIS:  Chronic pain syndrome  POST-OPERATIVE DIAGNOSIS:  Chronic pain syndrome  PROCEDURE:  Procedure(s): LUMBAR SPINAL CORD STIMULATOR INSERTION (N/A)  SURGEON:  Surgeon(s) and Role:    * Melina Schools, MD - Primary  PHYSICIAN ASSISTANT:   ASSISTANTS: Carmen Mayo   ANESTHESIA:   general  EBL:  Total I/O In: 2200 [I.V.:2200] Out: 425 [Urine:400; Blood:25]  BLOOD ADMINISTERED:none  DRAINS: none   LOCAL MEDICATIONS USED:  MARCAINE     SPECIMEN:  No Specimen  DISPOSITION OF SPECIMEN:  N/A  COUNTS:  YES  TOURNIQUET:  * No tourniquets in log *  DICTATION: .Other Dictation: Dictation Number [id number]  PLAN OF CARE: Admit for overnight observation  PATIENT DISPOSITION:  PACU - hemodynamically stable.

## 2016-05-21 NOTE — Transfer of Care (Signed)
Immediate Anesthesia Transfer of Care Note  Patient: [name]  Procedure(s) Performed: Procedure(s): LUMBAR SPINAL CORD STIMULATOR INSERTION (N/A)  Patient Location: PACU  Anesthesia Type:General  Level of Consciousness: awake, alert  and patient cooperative  Airway & Oxygen Therapy: Patient Spontanous Breathing and Patient connected to face mask oxygen  Post-op Assessment: Report given to RN, Post -op Vital signs reviewed and stable, Patient moving all extremities X 4 and Patient able to stick tongue midline  Post vital signs: Reviewed and stable  Last Vitals:  Vitals:   05/21/16 0624  BP: (!) 100/58  Pulse: 61  Resp: 20  Temp: 36.7 C    Last Pain:  Vitals:   05/21/16 0624  TempSrc: Oral  PainSc:       Patients Stated Pain Goal: 8 ([id number])  Complications: No apparent anesthesia complications

## 2016-05-21 NOTE — H&P (Signed)
History of Present Illness  The patient is a 61 year old female who presents for a follow-up for H & P. The patient is scheduled for a SCS placement to be performed by Dr. Duane Lope D. Rolena Infante, MD at [facility] on 05-21-16 . Please see the hospital record for complete dictated history and physical. The pt reports smoking 4 cigarettes a day. She reports a hx of good health.  Problem List/Past Medical  Cervical pain (M54.2)  Cervical disc disorder, unspecified, mid-cervical region (M50.92)  Thoracic spine pain (M54.6)  Degeneration of thoracic intervertebral disc (M51.34)  Tobacco abuse (Z72.0)  Degenerative lumbar disc (M51.36)  Lumbar post-laminectomy syndrome (M96.1)  Cervical disc disorder with radiculopathy, mid-cervical region (M50.120)  S/P cervical spinal fusion (Z98.1)  Problems Reconciled   Allergies  Mucinex *COUGH/COLD/ALLERGY*  Rash. Allergies Reconciled   Family History  Diabetes Mellitus  mother and father  Social History  Tobacco use  Current every day smoker. former smoker Tobacco / smoke exposure  yes Current work status  disabled Children  2 Drug/Alcohol Rehab (Previously)  no Alcohol use  never consumed alcohol Drug/Alcohol Rehab (Currently)  no Pain Contract  yes Exercise  Exercises daily; does running / walking Marital status  married Living situation  live with spouse Number of flights of stairs before winded  greater than 5 Illicit drug use  yes  Medication History  Percocet (5-325MG  Tablet, 1 (one) Oral three times daily, as needed, Taken starting 02/03/2016) Active. Aspirin (81MG  Tablet DR, Oral) Active. (qd) Lisinopril-Hydrochlorothiazide (10-12.5MG  Tablet, Oral) Active. Zolpidem Tartrate (10MG  Tablet, Oral) Active. (prn) Medications Reconciled  Other Problems Hypercholesterolemia  High blood pressure   Vitals  05/18/2016 9:46 AM Weight: 149 lb Height: 61.5in Body Surface Area: 1.68 m Body Mass  Index: 27.7 kg/m  Pulse: 83 (Regular)  BP: 192/111 (Sitting, Right Arm, Standard)  General General Appearance-Not in acute distress. Orientation-Oriented X3. Build & Nutrition-Well nourished and Well developed.  Integumentary General Characteristics Surgical Scars - no surgical scar evidence of previous lumbar surgery. Lumbar Spine-Skin examination of the lumbar spine is without deformity, skin lesions, lacerations or abrasions.  Chest and Lung Exam Auscultation Breath sounds - Normal and Clear.  Cardiovascular Auscultation Rhythm - Regular rate and rhythm.  Abdomen Palpation/Percussion Palpation and Percussion of the abdomen reveal - Soft, Non Tender and No Rebound tenderness.  Peripheral Vascular Lower Extremity Palpation - Posterior tibial pulse - Bilateral - 2+. Dorsalis pedis pulse - Bilateral - 2+.  Neurologic Sensation Lower Extremity - Left - sensation is intact in the lower extremity. Right - sensation is diminished in the lower extremity. Reflexes Patellar Reflex - Bilateral - 2+. Achilles Reflex - Bilateral - 2+. Clonus - Bilateral - clonus not present. Hoffman's Sign - Bilateral - Hoffman's sign not present. Testing Seated Straight Leg Raise - Bilateral - Seated straight leg raise negative.  Musculoskeletal Spine/Ribs/Pelvis  Lumbosacral Spine: Inspection and Palpation - Tenderness - left lumbar paraspinals tender to palpation and right lumbar paraspinals tender to palpation. Strength and Tone: Strength - Hip Flexion - Bilateral - 5/5. Knee Extension - Bilateral - 5/5. Knee Flexion - Bilateral - 5/5. Ankle Dorsiflexion - Bilateral - 5/5. Ankle Plantarflexion - Bilateral - 5/5. Heel walk - Bilateral - able to heel walk without difficulty. Toe Walk - Bilateral - able to walk on toes without difficulty. Heel-Toe Walk - Bilateral - able to heel-toe walk without difficulty. ROM - Flexion - moderately decreased range of motion and painful. Extension -  moderately decreased range of  motion and painful. Left Lateral Bending - moderately decreased range of motion and painful. Right Lateral Bending - moderately decreased range of motion and painful. Right Rotation - moderately decreased range of motion and painful. Left Rotation - moderately decreased range of motion and painful. Pain - neither flexion or extension is more painful than the other. Lumbosacral Spine - Waddell's Signs - no Waddell's signs present. Lower Extremity Range of Motion - No true hip, knee or ankle pain with range of motion. Gait and Station - [employer] - no assistive devices.  Thoracic MRI done 05/07/2016 shows a chronic T9 compression fracture, but no significant stenosis, no disc herniation. No contraindication for implantation of the spinal cord stimulator.  Assessment & Plan   Goal Of Surgery: Discussed that goal of surgery is to reduce pain and improve function and quality of life. Patient is aware that despite all appropriate treatment that there pain and function could be the same, worse, or different.   We have reviewed the risks which include infection, bleeding, nerve damage, death, stroke paralysis, failure to heal, need for further surgery, ongoing or worse pain, migration of the leads, bleeding, blood clots, collapsed lung and need for additional surgery. All of her and her husband's questions were addressed. We will plan on moving forward with surgery as soon as we have clearance from her primary care physician.

## 2016-05-21 NOTE — Anesthesia Procedure Notes (Signed)
Procedure Name: Intubation Date/Time: 05/21/2016 7:45 AM Performed by: Rica Koyanagi Pre-anesthesia Checklist: Patient identified, Emergency Drugs available, Suction available and Patient being monitored Patient Re-evaluated:Patient Re-evaluated prior to inductionOxygen Delivery Method: Circle system utilized Preoxygenation: Pre-oxygenation with 100% oxygen Intubation Type: IV induction Ventilation: Mask ventilation without difficulty Laryngoscope Size: Mac and 3 Grade View: Grade I Tube type: Oral Tube size: 7.0 mm Number of attempts: 1 Airway Equipment and Method: Stylet Placement Confirmation: ETT inserted through vocal cords under direct vision,  positive ETCO2 and breath sounds checked- equal and bilateral Secured at: 22 cm Tube secured with: Tape Dental Injury: Teeth and Oropharynx as per pre-operative assessment

## 2016-05-22 ENCOUNTER — Encounter (HOSPITAL_COMMUNITY): Payer: Self-pay | Admitting: Orthopedic Surgery

## 2016-05-22 DIAGNOSIS — Z7982 Long term (current) use of aspirin: Secondary | ICD-10-CM | POA: Diagnosis not present

## 2016-05-22 DIAGNOSIS — G5783 Other specified mononeuropathies of bilateral lower limbs: Secondary | ICD-10-CM | POA: Diagnosis not present

## 2016-05-22 DIAGNOSIS — G894 Chronic pain syndrome: Secondary | ICD-10-CM | POA: Diagnosis not present

## 2016-05-22 DIAGNOSIS — I739 Peripheral vascular disease, unspecified: Secondary | ICD-10-CM | POA: Diagnosis not present

## 2016-05-22 DIAGNOSIS — Z981 Arthrodesis status: Secondary | ICD-10-CM | POA: Diagnosis not present

## 2016-05-22 DIAGNOSIS — M542 Cervicalgia: Secondary | ICD-10-CM | POA: Diagnosis not present

## 2016-05-22 DIAGNOSIS — M961 Postlaminectomy syndrome, not elsewhere classified: Secondary | ICD-10-CM | POA: Diagnosis not present

## 2016-05-22 DIAGNOSIS — K219 Gastro-esophageal reflux disease without esophagitis: Secondary | ICD-10-CM | POA: Diagnosis not present

## 2016-05-22 DIAGNOSIS — I6523 Occlusion and stenosis of bilateral carotid arteries: Secondary | ICD-10-CM | POA: Diagnosis not present

## 2016-05-22 DIAGNOSIS — Z888 Allergy status to other drugs, medicaments and biological substances status: Secondary | ICD-10-CM | POA: Diagnosis not present

## 2016-05-22 DIAGNOSIS — K589 Irritable bowel syndrome without diarrhea: Secondary | ICD-10-CM | POA: Diagnosis not present

## 2016-05-22 DIAGNOSIS — K760 Fatty (change of) liver, not elsewhere classified: Secondary | ICD-10-CM | POA: Diagnosis not present

## 2016-05-22 DIAGNOSIS — M546 Pain in thoracic spine: Secondary | ICD-10-CM | POA: Diagnosis not present

## 2016-05-22 DIAGNOSIS — E7800 Pure hypercholesterolemia, unspecified: Secondary | ICD-10-CM | POA: Diagnosis not present

## 2016-05-22 DIAGNOSIS — E785 Hyperlipidemia, unspecified: Secondary | ICD-10-CM | POA: Diagnosis not present

## 2016-05-22 DIAGNOSIS — I10 Essential (primary) hypertension: Secondary | ICD-10-CM | POA: Diagnosis not present

## 2016-05-22 DIAGNOSIS — J449 Chronic obstructive pulmonary disease, unspecified: Secondary | ICD-10-CM | POA: Diagnosis not present
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
## 2016-05-22 NOTE — Progress Notes (Signed)
Pt doing well. Pt given D/C instructions with Rx's, verbal understanding was provided. Pt's incisions are clean and dry with no sign of infection. Pt's IV was removed prior to D/C. Pt D/C'd home via wheelchair @ [phone] per MD order. Pt is stable @ D/C and has no other needs at this time. Holli Humbles, RN

## 2016-05-22 NOTE — Progress Notes (Signed)
    Subjective: Procedure(s) (LRB): LUMBAR SPINAL CORD STIMULATOR INSERTION (N/A) 1 Day Post-Op  Patient reports pain as 2 on 0-10 scale.  Reports decreased leg pain reports incisional back pain   Positive void Negative bowel movement Positive flatus Negative chest pain or shortness of breath  Objective: Vital signs in last 24 hours: Temp:  [97.9 F (36.6 C)-98.4 F (36.9 C)] 98.3 F (36.8 C) (02/09 0403) Pulse Rate:  [63-91] 63 (02/09 0403) Resp:  [12-27] 18 (02/09 0403) BP: (107-124)/(54-73) 107/54 (02/09 0403) SpO2:  [93 %-99 %] 93 % (02/09 0403)  Intake/Output from previous day: 02/08 0701 - 02/09 0700 In: 3700 [P.O.:600; I.V.:2200; IV Piggyback:900] Out: 425 [Urine:400; Blood:25]  Labs:  Recent Labs  05/19/16 1502  WBC 9.3  RBC 4.51  HCT 41.4  PLT 283    Recent Labs  05/19/16 1502  NA 137  K 3.1*  CL 96*  CO2 30  BUN 5*  CREATININE 0.77  GLUCOSE 89  CALCIUM 9.7   No results for input(s): LABPT, INR in the last 72 hours.  Physical Exam: Neurologically intact ABD soft Intact pulses distally Incision: dressing C/D/I and no drainage Compartment soft  Assessment/Plan: Patient stable  xrays n/a Continue mobilization with physical therapy Continue care  Advance diet Up with therapy  D/c to home today F/u in 2 weeks  Melina Schools, MD [place] [phone]
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
## 2016-06-05 DIAGNOSIS — M961 Postlaminectomy syndrome, not elsewhere classified: Secondary | ICD-10-CM | POA: Diagnosis not present

## 2016-06-05 DIAGNOSIS — M5136 Other intervertebral disc degeneration, lumbar region: Secondary | ICD-10-CM | POA: Diagnosis not present

## 2016-06-05 DIAGNOSIS — Z4789 Encounter for other orthopedic aftercare: Secondary | ICD-10-CM | POA: Diagnosis not present

## 2016-06-29 DIAGNOSIS — Z0000 Encounter for general adult medical examination without abnormal findings: Secondary | ICD-10-CM | POA: Diagnosis not present

## 2016-06-29 DIAGNOSIS — I10 Essential (primary) hypertension: Secondary | ICD-10-CM | POA: Diagnosis not present

## 2016-06-29 DIAGNOSIS — R5383 Other fatigue: Secondary | ICD-10-CM | POA: Diagnosis not present

## 2016-07-03 DIAGNOSIS — M7061 Trochanteric bursitis, right hip: Secondary | ICD-10-CM | POA: Diagnosis not present

## 2016-07-14 DIAGNOSIS — E559 Vitamin D deficiency, unspecified: Secondary | ICD-10-CM | POA: Diagnosis not present

## 2016-07-14 DIAGNOSIS — E782 Mixed hyperlipidemia: Secondary | ICD-10-CM | POA: Diagnosis not present

## 2016-07-14 DIAGNOSIS — Z0001 Encounter for general adult medical examination with abnormal findings: Secondary | ICD-10-CM | POA: Diagnosis not present

## 2016-07-14 DIAGNOSIS — I10 Essential (primary) hypertension: Secondary | ICD-10-CM | POA: Diagnosis not present

## 2016-07-20 DIAGNOSIS — M5136 Other intervertebral disc degeneration, lumbar region: Secondary | ICD-10-CM | POA: Diagnosis not present

## 2016-07-20 DIAGNOSIS — Z981 Arthrodesis status: Secondary | ICD-10-CM | POA: Diagnosis not present

## 2016-07-20 DIAGNOSIS — M961 Postlaminectomy syndrome, not elsewhere classified: Secondary | ICD-10-CM | POA: Diagnosis not present

## 2016-08-03 ENCOUNTER — Other Ambulatory Visit: Payer: Self-pay | Admitting: Physical Medicine and Rehabilitation

## 2016-08-03 DIAGNOSIS — M961 Postlaminectomy syndrome, not elsewhere classified: Secondary | ICD-10-CM

## 2016-08-10 ENCOUNTER — Ambulatory Visit
Admission: RE | Admit: 2016-08-10 | Discharge: 2016-08-10 | Disposition: A | Discharge status: Home / Self Care | Payer: Medicare Other | Source: Ambulatory Visit | Attending: Physical Medicine and Rehabilitation | Admitting: Physical Medicine and Rehabilitation

## 2016-08-10 DIAGNOSIS — M5126 Other intervertebral disc displacement, lumbar region: Secondary | ICD-10-CM | POA: Diagnosis not present

## 2016-08-10 DIAGNOSIS — M961 Postlaminectomy syndrome, not elsewhere classified: Secondary | ICD-10-CM

## 2016-08-10 MED ORDER — MEPERIDINE HCL 100 MG/ML IJ SOLN
75.0000 mg | Freq: Once | INTRAMUSCULAR | Status: AC
  Administered 2016-08-10: 75 mg via INTRAMUSCULAR

## 2016-08-10 MED ORDER — ONDANSETRON HCL 4 MG/2ML IJ SOLN
4.0000 mg | Freq: Once | INTRAMUSCULAR | Status: AC
  Administered 2016-08-10: 4 mg via INTRAMUSCULAR

## 2016-08-10 MED ORDER — DIAZEPAM 5 MG PO TABS
10.0000 mg | ORAL_TABLET | Freq: Once | ORAL | Status: AC
  Administered 2016-08-10: 10 mg via ORAL

## 2016-08-10 MED ORDER — IOPAMIDOL (ISOVUE-M 200) INJECTION 41%
15.0000 mL | Freq: Once | INTRAMUSCULAR | Status: AC
  Administered 2016-08-10: 15 mL via INTRATHECAL

## 2016-08-10 NOTE — Discharge Instructions (Signed)
Myelogram Discharge Instructions  1. Go home and rest quietly for the next 24 hours.  It is important to lie flat for the next 24 hours.  Get up only to go to the restroom.  You may lie in the bed or on a couch on your back, your stomach, your left side or your right side.  You may have one pillow under your head.  You may have pillows between your knees while you are on your side or under your knees while you are on your back.  2. DO NOT drive today.  Recline the seat as far back as it will go, while still wearing your seat belt, on the way home.  3. You may get up to go to the bathroom as needed.  You may sit up for 10 minutes to eat.  You may resume your normal diet and medications unless otherwise indicated.  Drink lots of extra fluids today and tomorrow.  4. The incidence of headache, nausea, or vomiting is about 5% (one in 20 patients).  If you develop a headache, lie flat and drink plenty of fluids until the headache goes away.  Caffeinated beverages may be helpful.  If you develop severe nausea and vomiting or a headache that does not go away with flat bed rest, call [phone].  5. You may resume normal activities after your 24 hours of bed rest is over; however, do not exert yourself strongly or do any heavy lifting tomorrow. If when you get up you have a headache when standing, go back to bed and force fluids for another 24 hours.  6. Call your physician for a follow-up appointment.  The results of your myelogram will be sent directly to your physician by the following day.  7. If you have any questions or if complications develop after you arrive home, please call [phone].  Discharge instructions have been explained to the patient.  The patient, or the person responsible for the patient, fully understands these instructions.       May resume Wellbutrin on Aug 11, 2016, after 1:00 pm.

## 2016-08-18 DIAGNOSIS — M5136 Other intervertebral disc degeneration, lumbar region: Secondary | ICD-10-CM | POA: Diagnosis not present

## 2016-08-18 DIAGNOSIS — M961 Postlaminectomy syndrome, not elsewhere classified: Secondary | ICD-10-CM | POA: Diagnosis not present

## 2016-09-11 NOTE — Addendum Note (Signed)
Addendum  created 09/11/16 1055 by Rica Koyanagi, MD   Sign clinical note

## 2016-09-14 DIAGNOSIS — M7061 Trochanteric bursitis, right hip: Secondary | ICD-10-CM | POA: Diagnosis not present

## 2016-10-16 DIAGNOSIS — M5134 Other intervertebral disc degeneration, thoracic region: Secondary | ICD-10-CM | POA: Diagnosis not present

## 2016-10-16 DIAGNOSIS — G894 Chronic pain syndrome: Secondary | ICD-10-CM | POA: Diagnosis not present

## 2016-10-16 DIAGNOSIS — M5136 Other intervertebral disc degeneration, lumbar region: Secondary | ICD-10-CM | POA: Diagnosis not present

## 2016-10-16 DIAGNOSIS — M961 Postlaminectomy syndrome, not elsewhere classified: Secondary | ICD-10-CM | POA: Diagnosis not present

## 2016-11-20 DIAGNOSIS — M5136 Other intervertebral disc degeneration, lumbar region: Secondary | ICD-10-CM | POA: Diagnosis not present

## 2016-11-20 DIAGNOSIS — M5416 Radiculopathy, lumbar region: Secondary | ICD-10-CM | POA: Diagnosis not present

## 2016-11-20 DIAGNOSIS — M5117 Intervertebral disc disorders with radiculopathy, lumbosacral region: Secondary | ICD-10-CM | POA: Diagnosis not present

## 2017-01-18 DIAGNOSIS — R1013 Epigastric pain: Secondary | ICD-10-CM | POA: Diagnosis not present

## 2017-01-19 ENCOUNTER — Other Ambulatory Visit: Payer: Self-pay | Admitting: Internal Medicine

## 2017-01-19 DIAGNOSIS — Z1231 Encounter for screening mammogram for malignant neoplasm of breast: Secondary | ICD-10-CM

## 2017-02-11 ENCOUNTER — Ambulatory Visit: Payer: Medicare Other

## 2017-03-12 ENCOUNTER — Ambulatory Visit: Payer: Medicare Other

## 2017-03-23 DIAGNOSIS — M545 Low back pain: Secondary | ICD-10-CM | POA: Diagnosis not present

## 2017-03-23 DIAGNOSIS — M2550 Pain in unspecified joint: Secondary | ICD-10-CM | POA: Diagnosis not present

## 2017-03-23 DIAGNOSIS — R5382 Chronic fatigue, unspecified: Secondary | ICD-10-CM | POA: Diagnosis not present

## 2017-03-30 DIAGNOSIS — M2550 Pain in unspecified joint: Secondary | ICD-10-CM | POA: Diagnosis not present

## 2017-03-30 DIAGNOSIS — E7849 Other hyperlipidemia: Secondary | ICD-10-CM | POA: Diagnosis not present

## 2017-03-30 DIAGNOSIS — I110 Hypertensive heart disease with heart failure: Secondary | ICD-10-CM | POA: Diagnosis not present

## 2017-03-30 DIAGNOSIS — R5382 Chronic fatigue, unspecified: Secondary | ICD-10-CM | POA: Diagnosis not present

## 2017-04-21 DIAGNOSIS — M2550 Pain in unspecified joint: Secondary | ICD-10-CM | POA: Diagnosis not present

## 2017-04-21 DIAGNOSIS — I110 Hypertensive heart disease with heart failure: Secondary | ICD-10-CM | POA: Diagnosis not present

## 2017-04-21 DIAGNOSIS — E7849 Other hyperlipidemia: Secondary | ICD-10-CM | POA: Diagnosis not present

## 2017-04-21 DIAGNOSIS — R5382 Chronic fatigue, unspecified: Secondary | ICD-10-CM | POA: Diagnosis not present

## 2017-04-22 DIAGNOSIS — E559 Vitamin D deficiency, unspecified: Secondary | ICD-10-CM | POA: Diagnosis not present

## 2017-05-07 DIAGNOSIS — H2512 Age-related nuclear cataract, left eye: Secondary | ICD-10-CM | POA: Diagnosis not present

## 2017-05-11 DIAGNOSIS — M2550 Pain in unspecified joint: Secondary | ICD-10-CM | POA: Diagnosis not present

## 2017-05-11 DIAGNOSIS — I110 Hypertensive heart disease with heart failure: Secondary | ICD-10-CM | POA: Diagnosis not present

## 2017-05-11 DIAGNOSIS — R5382 Chronic fatigue, unspecified: Secondary | ICD-10-CM | POA: Diagnosis not present

## 2017-05-11 DIAGNOSIS — E7849 Other hyperlipidemia: Secondary | ICD-10-CM | POA: Diagnosis not present

## 2017-05-12 ENCOUNTER — Emergency Department (HOSPITAL_COMMUNITY): Payer: Medicare Other

## 2017-05-12 ENCOUNTER — Other Ambulatory Visit: Payer: Self-pay

## 2017-05-12 ENCOUNTER — Emergency Department (HOSPITAL_COMMUNITY)
Admission: EM | Admit: 2017-05-12 | Discharge: 2017-05-13 | Disposition: A | Discharge status: Home / Self Care | Payer: Medicare Other | Attending: Emergency Medicine | Admitting: Emergency Medicine

## 2017-05-12 ENCOUNTER — Encounter (HOSPITAL_COMMUNITY): Payer: Self-pay | Admitting: Emergency Medicine

## 2017-05-12 DIAGNOSIS — E785 Hyperlipidemia, unspecified: Secondary | ICD-10-CM | POA: Diagnosis not present

## 2017-05-12 DIAGNOSIS — R079 Chest pain, unspecified: Secondary | ICD-10-CM | POA: Diagnosis not present

## 2017-05-12 DIAGNOSIS — R072 Precordial pain: Secondary | ICD-10-CM | POA: Diagnosis not present

## 2017-05-12 DIAGNOSIS — R1013 Epigastric pain: Secondary | ICD-10-CM | POA: Diagnosis not present

## 2017-05-12 DIAGNOSIS — J449 Chronic obstructive pulmonary disease, unspecified: Secondary | ICD-10-CM | POA: Insufficient documentation

## 2017-05-12 DIAGNOSIS — Z8673 Personal history of transient ischemic attack (TIA), and cerebral infarction without residual deficits: Secondary | ICD-10-CM | POA: Insufficient documentation

## 2017-05-12 DIAGNOSIS — Z7982 Long term (current) use of aspirin: Secondary | ICD-10-CM | POA: Diagnosis not present

## 2017-05-12 DIAGNOSIS — R0789 Other chest pain: Secondary | ICD-10-CM | POA: Diagnosis not present

## 2017-05-12 DIAGNOSIS — Z79899 Other long term (current) drug therapy: Secondary | ICD-10-CM | POA: Diagnosis not present

## 2017-05-12 DIAGNOSIS — I10 Essential (primary) hypertension: Secondary | ICD-10-CM | POA: Insufficient documentation

## 2017-05-12 DIAGNOSIS — K5730 Diverticulosis of large intestine without perforation or abscess without bleeding: Secondary | ICD-10-CM | POA: Diagnosis not present

## 2017-05-12 DIAGNOSIS — F17210 Nicotine dependence, cigarettes, uncomplicated: Secondary | ICD-10-CM | POA: Diagnosis not present

## 2017-05-12 LAB — BASIC METABOLIC PANEL
ANION GAP: 10 (ref 5–15)
BUN: 12 mg/dL (ref 6–20)
CO2: 22 mmol/L (ref 22–32)
Calcium: 8.5 mg/dL — ABNORMAL LOW (ref 8.9–10.3)
Chloride: 104 mmol/L (ref 101–111)
Creatinine, Ser: 0.86 mg/dL (ref 0.44–1.00)
Glucose, Bld: 115 mg/dL — ABNORMAL HIGH (ref 65–99)
POTASSIUM: 3.4 mmol/L — AB (ref 3.5–5.1)
SODIUM: 136 mmol/L (ref 135–145)

## 2017-05-12 LAB — CBC
HEMATOCRIT: 38.4 % (ref 36.0–46.0)
Hemoglobin: 12.6 g/dL (ref 12.0–15.0)
MCH: 29.9 pg (ref 26.0–34.0)
MCHC: 32.8 g/dL (ref 30.0–36.0)
MCV: 91 fL (ref 78.0–100.0)
Platelets: 166 10*3/uL (ref 150–400)
RBC: 4.22 MIL/uL (ref 3.87–5.11)
RDW: 14.5 % (ref 11.5–15.5)
WBC: 5.3 10*3/uL (ref 4.0–10.5)

## 2017-05-12 LAB — I-STAT TROPONIN, ED
TROPONIN I, POC: 0 ng/mL (ref 0.00–0.08)
Troponin i, poc: 0 ng/mL (ref 0.00–0.08)

## 2017-05-12 MED ORDER — IOPAMIDOL (ISOVUE-370) INJECTION 76%
INTRAVENOUS | Status: AC
  Administered 2017-05-12: 100 mL
  Filled 2017-05-12: qty 100

## 2017-05-12 NOTE — ED Triage Notes (Signed)
Pt arrived [place]  c/o sudden onset of midsternal nausea, and chest pain radiating to her back an neck. Pt recently started 200mg  BID labetolol today. Given 2 nitro enroute that brought pain down to a 5 but systolic BP dropped to 23V, Pt given 349mL bolus NS PTA with EMS Pt BP101/67 P62SR RR12 94%RA 18LAC

## 2017-05-12 NOTE — ED Notes (Signed)
Patient transported to CT 

## 2017-05-12 NOTE — ED Notes (Signed)
Patient transported to X-ray 

## 2017-05-13 NOTE — ED Notes (Signed)
PT states understanding of care given, follow up care, and medication prescribed. PT ambulated from ED to car with a steady gait. 
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
## 2017-05-13 NOTE — Discharge Instructions (Signed)
Please read instructions below.  Stop taking the Labetolol until you have been seen by your primary care or cardiologist. Schedule an appointment with the Cardiologist to follow up on your visit today.  Return to the ER for new or worsening symptoms; including worsening chest pain, shortness of breath, pain that radiates to the arm or neck, pain or shortness of breath worsened with exertion.

## 2017-05-18 DIAGNOSIS — I110 Hypertensive heart disease with heart failure: Secondary | ICD-10-CM | POA: Diagnosis not present

## 2017-05-18 DIAGNOSIS — R5382 Chronic fatigue, unspecified: Secondary | ICD-10-CM | POA: Diagnosis not present

## 2017-05-29 DIAGNOSIS — J019 Acute sinusitis, unspecified: Secondary | ICD-10-CM | POA: Diagnosis not present

## 2017-05-31 ENCOUNTER — Other Ambulatory Visit (HOSPITAL_COMMUNITY): Payer: Self-pay | Admitting: Family Medicine

## 2017-05-31 DIAGNOSIS — Z1231 Encounter for screening mammogram for malignant neoplasm of breast: Secondary | ICD-10-CM

## 2017-06-03 ENCOUNTER — Encounter (HOSPITAL_COMMUNITY): Payer: Self-pay

## 2017-06-03 ENCOUNTER — Ambulatory Visit (HOSPITAL_COMMUNITY)
Admission: RE | Admit: 2017-06-03 | Discharge: 2017-06-03 | Disposition: A | Discharge status: Home / Self Care | Payer: Medicare Other | Source: Ambulatory Visit | Attending: Family Medicine | Admitting: Family Medicine

## 2017-06-03 DIAGNOSIS — Z1231 Encounter for screening mammogram for malignant neoplasm of breast: Secondary | ICD-10-CM | POA: Insufficient documentation

## 2017-06-08 DIAGNOSIS — I119 Hypertensive heart disease without heart failure: Secondary | ICD-10-CM | POA: Diagnosis not present

## 2017-06-08 DIAGNOSIS — R5382 Chronic fatigue, unspecified: Secondary | ICD-10-CM | POA: Diagnosis not present

## 2017-07-09 DIAGNOSIS — E7849 Other hyperlipidemia: Secondary | ICD-10-CM | POA: Diagnosis not present

## 2017-07-09 DIAGNOSIS — I110 Hypertensive heart disease with heart failure: Secondary | ICD-10-CM | POA: Diagnosis not present

## 2017-07-09 DIAGNOSIS — R5382 Chronic fatigue, unspecified: Secondary | ICD-10-CM | POA: Diagnosis not present

## 2017-07-14 ENCOUNTER — Encounter: Payer: Self-pay | Admitting: Internal Medicine

## 2017-08-11 DIAGNOSIS — M961 Postlaminectomy syndrome, not elsewhere classified: Secondary | ICD-10-CM | POA: Diagnosis not present

## 2017-08-27 ENCOUNTER — Encounter

## 2017-08-27 ENCOUNTER — Ambulatory Visit: Payer: Medicare Other | Admitting: Gastroenterology

## 2017-08-27 DIAGNOSIS — M5416 Radiculopathy, lumbar region: Secondary | ICD-10-CM | POA: Diagnosis not present

## 2017-09-03 ENCOUNTER — Encounter: Payer: Self-pay | Admitting: *Deleted

## 2017-09-03 ENCOUNTER — Ambulatory Visit: Payer: Medicare Other | Admitting: Gastroenterology

## 2017-09-03 ENCOUNTER — Other Ambulatory Visit: Payer: Self-pay | Admitting: *Deleted

## 2017-09-03 ENCOUNTER — Telehealth: Payer: Self-pay | Admitting: Gastroenterology

## 2017-09-03 ENCOUNTER — Encounter: Payer: Self-pay | Admitting: Gastroenterology

## 2017-09-03 VITALS — BP 132/74 | HR 101 | Temp 97.0°F | Ht 61.0 in | Wt 157.4 lb

## 2017-09-03 DIAGNOSIS — R1319 Other dysphagia: Secondary | ICD-10-CM | POA: Insufficient documentation

## 2017-09-03 DIAGNOSIS — F458 Other somatoform disorders: Secondary | ICD-10-CM

## 2017-09-03 DIAGNOSIS — R0989 Other specified symptoms and signs involving the circulatory and respiratory systems: Secondary | ICD-10-CM

## 2017-09-03 DIAGNOSIS — G8929 Other chronic pain: Secondary | ICD-10-CM | POA: Diagnosis not present

## 2017-09-03 DIAGNOSIS — R197 Diarrhea, unspecified: Secondary | ICD-10-CM

## 2017-09-03 DIAGNOSIS — R1310 Dysphagia, unspecified: Secondary | ICD-10-CM | POA: Diagnosis not present

## 2017-09-03 DIAGNOSIS — R1013 Epigastric pain: Secondary | ICD-10-CM

## 2017-09-03 MED ORDER — PANTOPRAZOLE SODIUM 40 MG PO TBEC: 40.0000 mg | DELAYED_RELEASE_TABLET | Freq: Every day | ORAL | 5 refills | Status: DC

## 2017-09-03 MED ORDER — PEG 3350-KCL-NA BICARB-NACL 420 G PO SOLR: 4000.0000 mL | Freq: Once | ORAL | 0 refills | Status: AC

## 2017-09-03 NOTE — Assessment & Plan Note (Signed)
Complains of esophageal dysphagia to solid foods, epigastric discomfort, globus. Symptoms worsening. Will start PPI. Plan for EGD/ED with deep sedation in near future.  I have discussed the risks, alternatives, benefits with regards to but not limited to the risk of reaction to medication, bleeding, infection, perforation and the patient is agreeable to proceed. Written consent to be obtained.

## 2017-09-03 NOTE — Patient Instructions (Signed)
1. Colonoscopy and upper endoscopy as scheduled. See separate instructions.  
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
## 2017-09-03 NOTE — Assessment & Plan Note (Signed)
Suspected IBS-D and has had documented bacterial overgrowth in past although denied improvement with antibiotics. Did well on Viberzi before but came off of medication due to FDA warning for post-cholecystectomy patient.   She is due for colonoscopy for h/o adenomatous colon polyps. Plan on colonoscopy with deep sedation, previously required high dose medication for conscious sedation.   I have discussed the risks, alternatives, benefits with regards to but not limited to the risk of reaction to medication, bleeding, infection, perforation and the patient is agreeable to proceed. Written consent to be obtained.  Previous biopsied negative for microscopic colitis remotely (2009). Consider repeat biopsies to rule out microscopic colitis.

## 2017-09-03 NOTE — Telephone Encounter (Signed)
Please let patient know I meant to give her PPI for epigastric pain, dysphagia, globus. I want to treat as GERD to see if improves. If she has great response, she may not need EGD at time of her TCS but will leave on schedule for now.   rx for pantoprazole sent to [facility]

## 2017-09-07 ENCOUNTER — Telehealth: Payer: Self-pay | Admitting: *Deleted

## 2017-09-07 DIAGNOSIS — M4854XG Collapsed vertebra, not elsewhere classified, thoracic region, subsequent encounter for fracture with delayed healing: Secondary | ICD-10-CM | POA: Diagnosis not present

## 2017-09-07 DIAGNOSIS — M546 Pain in thoracic spine: Secondary | ICD-10-CM | POA: Diagnosis not present

## 2017-09-07 NOTE — Telephone Encounter (Signed)
Pre-op scheduled for 11/16/17 at 10:00am. Letter mailed. Patient aware

## 2017-09-07 NOTE — Telephone Encounter (Signed)
Patient is aware of pre-op

## 2017-09-07 NOTE — Telephone Encounter (Signed)
Lm with spouse. Waiting on a return call.

## 2017-09-07 NOTE — Progress Notes (Signed)
CC'D TO PCP °

## 2017-09-08 NOTE — Telephone Encounter (Signed)
Pt returned call and is aware of her RX.

## 2017-09-08 NOTE — Telephone Encounter (Signed)
Lm with spouse. Pt is going to call back.

## 2017-09-10 ENCOUNTER — Other Ambulatory Visit: Payer: Self-pay | Admitting: Physician Assistant

## 2017-09-10 DIAGNOSIS — M4854XG Collapsed vertebra, not elsewhere classified, thoracic region, subsequent encounter for fracture with delayed healing: Secondary | ICD-10-CM

## 2017-09-27 ENCOUNTER — Ambulatory Visit
Admission: RE | Admit: 2017-09-27 | Discharge: 2017-09-27 | Disposition: A | Discharge status: Home / Self Care | Payer: Medicare Other | Source: Ambulatory Visit | Attending: Physician Assistant | Admitting: Physician Assistant

## 2017-09-27 DIAGNOSIS — S22059D Unspecified fracture of T5-T6 vertebra, subsequent encounter for fracture with routine healing: Secondary | ICD-10-CM | POA: Diagnosis not present

## 2017-09-27 DIAGNOSIS — M4854XG Collapsed vertebra, not elsewhere classified, thoracic region, subsequent encounter for fracture with delayed healing: Secondary | ICD-10-CM

## 2017-09-29 DIAGNOSIS — R0689 Other abnormalities of breathing: Secondary | ICD-10-CM | POA: Diagnosis not present

## 2017-09-29 DIAGNOSIS — I10 Essential (primary) hypertension: Secondary | ICD-10-CM | POA: Diagnosis not present

## 2017-09-29 DIAGNOSIS — R52 Pain, unspecified: Secondary | ICD-10-CM | POA: Diagnosis not present

## 2017-09-29 DIAGNOSIS — R0902 Hypoxemia: Secondary | ICD-10-CM | POA: Diagnosis not present

## 2017-09-29 DIAGNOSIS — R1084 Generalized abdominal pain: Secondary | ICD-10-CM | POA: Diagnosis not present

## 2017-09-30 ENCOUNTER — Emergency Department (HOSPITAL_COMMUNITY): Payer: Medicare Other

## 2017-09-30 ENCOUNTER — Encounter (HOSPITAL_COMMUNITY): Payer: Self-pay | Admitting: Emergency Medicine

## 2017-09-30 ENCOUNTER — Other Ambulatory Visit: Payer: Self-pay

## 2017-09-30 ENCOUNTER — Emergency Department (HOSPITAL_COMMUNITY)
Admission: EM | Admit: 2017-09-30 | Discharge: 2017-09-30 | Disposition: A | Discharge status: Home / Self Care | Payer: Medicare Other | Attending: Emergency Medicine | Admitting: Emergency Medicine

## 2017-09-30 DIAGNOSIS — Z79899 Other long term (current) drug therapy: Secondary | ICD-10-CM | POA: Insufficient documentation

## 2017-09-30 DIAGNOSIS — R1013 Epigastric pain: Secondary | ICD-10-CM | POA: Diagnosis not present

## 2017-09-30 DIAGNOSIS — R002 Palpitations: Secondary | ICD-10-CM | POA: Diagnosis not present

## 2017-09-30 DIAGNOSIS — Z7982 Long term (current) use of aspirin: Secondary | ICD-10-CM | POA: Insufficient documentation

## 2017-09-30 DIAGNOSIS — Z8673 Personal history of transient ischemic attack (TIA), and cerebral infarction without residual deficits: Secondary | ICD-10-CM | POA: Diagnosis not present

## 2017-09-30 DIAGNOSIS — R1010 Upper abdominal pain, unspecified: Secondary | ICD-10-CM | POA: Insufficient documentation

## 2017-09-30 DIAGNOSIS — K760 Fatty (change of) liver, not elsewhere classified: Secondary | ICD-10-CM | POA: Diagnosis not present

## 2017-09-30 DIAGNOSIS — J449 Chronic obstructive pulmonary disease, unspecified: Secondary | ICD-10-CM | POA: Insufficient documentation

## 2017-09-30 DIAGNOSIS — Z87891 Personal history of nicotine dependence: Secondary | ICD-10-CM | POA: Diagnosis not present

## 2017-09-30 DIAGNOSIS — I10 Essential (primary) hypertension: Secondary | ICD-10-CM | POA: Insufficient documentation

## 2017-09-30 LAB — URINALYSIS, ROUTINE W REFLEX MICROSCOPIC
BACTERIA UA: NONE SEEN
Bilirubin Urine: NEGATIVE
GLUCOSE, UA: NEGATIVE mg/dL
Hgb urine dipstick: NEGATIVE
KETONES UR: NEGATIVE mg/dL
Nitrite: NEGATIVE
PROTEIN: NEGATIVE mg/dL
Specific Gravity, Urine: 1.009 (ref 1.005–1.030)
pH: 7 (ref 5.0–8.0)

## 2017-09-30 LAB — COMPREHENSIVE METABOLIC PANEL
ALK PHOS: 78 U/L (ref 38–126)
ALT: 22 U/L (ref 14–54)
AST: 21 U/L (ref 15–41)
Albumin: 3.7 g/dL (ref 3.5–5.0)
Anion gap: 6 (ref 5–15)
BUN: 11 mg/dL (ref 6–20)
CHLORIDE: 107 mmol/L (ref 101–111)
CO2: 26 mmol/L (ref 22–32)
Calcium: 8.7 mg/dL — ABNORMAL LOW (ref 8.9–10.3)
Creatinine, Ser: 0.63 mg/dL (ref 0.44–1.00)
GFR calc Af Amer: >60 mL/min (ref >60)
Glucose, Bld: 113 mg/dL — ABNORMAL HIGH (ref 65–99)
Potassium: 3.2 mmol/L — ABNORMAL LOW (ref 3.5–5.1)
SODIUM: 139 mmol/L (ref 135–145)
Total Bilirubin: 0.5 mg/dL (ref 0.3–1.2)
Total Protein: 6.3 g/dL — ABNORMAL LOW (ref 6.5–8.1)

## 2017-09-30 LAB — CBC WITH DIFFERENTIAL/PLATELET
BASOS PCT: 0 %
Basophils Absolute: 0 10*3/uL (ref 0.0–0.1)
Eosinophils Absolute: 0.1 10*3/uL (ref 0.0–0.7)
Eosinophils Relative: 2 %
HEMATOCRIT: 40 % (ref 36.0–46.0)
HEMOGLOBIN: 13.5 g/dL (ref 12.0–15.0)
LYMPHS ABS: 1.9 10*3/uL (ref 0.7–4.0)
LYMPHS PCT: 34 %
MCH: 30.8 pg (ref 26.0–34.0)
MCHC: 33.8 g/dL (ref 30.0–36.0)
MCV: 91.3 fL (ref 78.0–100.0)
MONOS PCT: 7 %
Monocytes Absolute: 0.4 10*3/uL (ref 0.1–1.0)
NEUTROS ABS: 3.2 10*3/uL (ref 1.7–7.7)
Neutrophils Relative %: 57 %
Platelets: 149 10*3/uL — ABNORMAL LOW (ref 150–400)
RBC: 4.38 MIL/uL (ref 3.87–5.11)
RDW: 13.9 % (ref 11.5–15.5)
WBC: 5.7 10*3/uL (ref 4.0–10.5)

## 2017-09-30 LAB — LIPASE, BLOOD: Lipase: 38 U/L (ref 11–51)

## 2017-09-30 MED ORDER — POTASSIUM CHLORIDE CRYS ER 20 MEQ PO TBCR
40.0000 meq | EXTENDED_RELEASE_TABLET | Freq: Once | ORAL | Status: AC
Start: 2017-09-30 — End: 2017-09-30
  Administered 2017-09-30: 40 meq via ORAL
  Filled 2017-09-30: qty 2

## 2017-09-30 MED ORDER — IOPAMIDOL (ISOVUE-300) INJECTION 61%
100.0000 mL | Freq: Once | INTRAVENOUS | Status: AC | PRN
  Administered 2017-09-30: 100 mL via INTRAVENOUS

## 2017-09-30 NOTE — ED Triage Notes (Signed)
Patient has complaints of hypertension and abdominal pain.  Has taken 2 40 mg Pravastatin and 1/2 pill of Halcion to help her sleep.
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
## 2017-09-30 NOTE — Discharge Instructions (Addendum)

## 2017-10-04 DIAGNOSIS — M4854XG Collapsed vertebra, not elsewhere classified, thoracic region, subsequent encounter for fracture with delayed healing: Secondary | ICD-10-CM | POA: Diagnosis not present

## 2017-10-04 DIAGNOSIS — M546 Pain in thoracic spine: Secondary | ICD-10-CM | POA: Diagnosis not present

## 2017-10-07 ENCOUNTER — Other Ambulatory Visit: Payer: Self-pay

## 2017-10-07 DIAGNOSIS — J069 Acute upper respiratory infection, unspecified: Secondary | ICD-10-CM | POA: Diagnosis not present

## 2017-10-07 DIAGNOSIS — E876 Hypokalemia: Secondary | ICD-10-CM | POA: Diagnosis not present

## 2017-10-07 DIAGNOSIS — I10 Essential (primary) hypertension: Secondary | ICD-10-CM | POA: Diagnosis not present

## 2017-10-07 NOTE — Patient Outreach (Signed)
[facility]) Care Management  10/07/2017  [name] 11-11-1955 [id number]   Medication Adherence call to Mrs. [name] left a message for patient to call back patient is due on Pravastatin 40 mg [place] said patient has not pick up this medication.Mrs. Beem is showing past due under [place].  [facility] Management Direct Dial [phone]  Fax [phone] [email or web address]

## 2017-10-08 DIAGNOSIS — E782 Mixed hyperlipidemia: Secondary | ICD-10-CM | POA: Diagnosis not present

## 2017-10-08 DIAGNOSIS — E559 Vitamin D deficiency, unspecified: Secondary | ICD-10-CM | POA: Diagnosis not present

## 2017-10-08 DIAGNOSIS — I10 Essential (primary) hypertension: Secondary | ICD-10-CM | POA: Diagnosis not present

## 2017-10-15 ENCOUNTER — Ambulatory Visit (HOSPITAL_COMMUNITY)
Admission: RE | Admit: 2017-10-15 | Discharge: 2017-10-15 | Disposition: A | Discharge status: Home / Self Care | Payer: Medicare Other | Source: Ambulatory Visit | Attending: Cardiovascular Disease | Admitting: Cardiovascular Disease

## 2017-10-15 ENCOUNTER — Other Ambulatory Visit (HOSPITAL_COMMUNITY): Payer: Self-pay | Admitting: Physician Assistant

## 2017-10-15 DIAGNOSIS — M79661 Pain in right lower leg: Secondary | ICD-10-CM | POA: Diagnosis not present

## 2017-10-15 DIAGNOSIS — M7989 Other specified soft tissue disorders: Principal | ICD-10-CM
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
## 2017-10-18 ENCOUNTER — Encounter (HOSPITAL_COMMUNITY): Payer: Self-pay | Admitting: *Deleted
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
## 2017-10-18 NOTE — Progress Notes (Addendum)
Denies chest pain or shortness of breath currently. Reports one ER visits for CP  that she attributed to uncontrolled HTN. Reports that they have had trouble with controlling her BP. Requested anesthesia review chart. Left message for Sherri at Dr. Rolena Infante office requesting that they put in new consent order with specific level for kyphoplasty.

## 2017-10-19 DIAGNOSIS — G4700 Insomnia, unspecified: Secondary | ICD-10-CM | POA: Diagnosis not present

## 2017-10-19 DIAGNOSIS — E782 Mixed hyperlipidemia: Secondary | ICD-10-CM | POA: Diagnosis not present

## 2017-10-19 DIAGNOSIS — E559 Vitamin D deficiency, unspecified: Secondary | ICD-10-CM | POA: Diagnosis not present

## 2017-10-19 DIAGNOSIS — M545 Low back pain: Secondary | ICD-10-CM | POA: Diagnosis not present

## 2017-10-19 DIAGNOSIS — I10 Essential (primary) hypertension: Secondary | ICD-10-CM | POA: Diagnosis not present

## 2017-10-20 ENCOUNTER — Ambulatory Visit (HOSPITAL_COMMUNITY): Payer: Medicare Other

## 2017-10-20 ENCOUNTER — Observation Stay (HOSPITAL_COMMUNITY)
Admission: RE | Admit: 2017-10-20 | Discharge: 2017-10-20 | Disposition: A | Discharge status: Home / Self Care | Payer: Medicare Other | Source: Ambulatory Visit | Attending: Orthopedic Surgery | Admitting: Orthopedic Surgery

## 2017-10-20 ENCOUNTER — Encounter (HOSPITAL_COMMUNITY): Payer: Self-pay | Admitting: Certified Registered Nurse Anesthetist

## 2017-10-20 ENCOUNTER — Ambulatory Visit (HOSPITAL_COMMUNITY): Payer: Medicare Other | Admitting: Physician Assistant

## 2017-10-20 ENCOUNTER — Encounter (HOSPITAL_COMMUNITY)
Admission: RE | Disposition: A | Discharge status: Home / Self Care | Payer: Self-pay | Source: Ambulatory Visit | Attending: Orthopedic Surgery

## 2017-10-20 DIAGNOSIS — I10 Essential (primary) hypertension: Secondary | ICD-10-CM | POA: Diagnosis not present

## 2017-10-20 DIAGNOSIS — Z79899 Other long term (current) drug therapy: Secondary | ICD-10-CM | POA: Insufficient documentation

## 2017-10-20 DIAGNOSIS — Z7982 Long term (current) use of aspirin: Secondary | ICD-10-CM | POA: Diagnosis not present

## 2017-10-20 DIAGNOSIS — Z9889 Other specified postprocedural states: Secondary | ICD-10-CM

## 2017-10-20 DIAGNOSIS — K219 Gastro-esophageal reflux disease without esophagitis: Secondary | ICD-10-CM | POA: Diagnosis not present

## 2017-10-20 DIAGNOSIS — I739 Peripheral vascular disease, unspecified: Secondary | ICD-10-CM | POA: Insufficient documentation

## 2017-10-20 DIAGNOSIS — M4854XA Collapsed vertebra, not elsewhere classified, thoracic region, initial encounter for fracture: Secondary | ICD-10-CM | POA: Diagnosis not present

## 2017-10-20 DIAGNOSIS — M8008XA Age-related osteoporosis with current pathological fracture, vertebra(e), initial encounter for fracture: Secondary | ICD-10-CM | POA: Diagnosis not present

## 2017-10-20 DIAGNOSIS — E785 Hyperlipidemia, unspecified: Secondary | ICD-10-CM | POA: Diagnosis not present

## 2017-10-20 DIAGNOSIS — Z8673 Personal history of transient ischemic attack (TIA), and cerebral infarction without residual deficits: Secondary | ICD-10-CM | POA: Insufficient documentation

## 2017-10-20 DIAGNOSIS — M8088XA Other osteoporosis with current pathological fracture, vertebra(e), initial encounter for fracture: Principal | ICD-10-CM | POA: Insufficient documentation

## 2017-10-20 DIAGNOSIS — J449 Chronic obstructive pulmonary disease, unspecified: Secondary | ICD-10-CM | POA: Diagnosis not present

## 2017-10-20 DIAGNOSIS — Z419 Encounter for procedure for purposes other than remedying health state, unspecified: Secondary | ICD-10-CM

## 2017-10-20 DIAGNOSIS — F17210 Nicotine dependence, cigarettes, uncomplicated: Secondary | ICD-10-CM | POA: Insufficient documentation

## 2017-10-20 DIAGNOSIS — Z981 Arthrodesis status: Secondary | ICD-10-CM | POA: Diagnosis not present

## 2017-10-20 HISTORY — PX: KYPHOPLASTY: SHX5884

## 2017-10-20 HISTORY — DX: Migraine, unspecified, not intractable, without status migrainosus: G43.909

## 2017-10-20 LAB — PROTIME-INR
INR: 0.96
PROTHROMBIN TIME: 12.7 s (ref 11.4–15.2)

## 2017-10-20 LAB — SURGICAL PCR SCREEN
MRSA, PCR: NEGATIVE
STAPHYLOCOCCUS AUREUS: NEGATIVE

## 2017-10-20 SURGERY — KYPHOPLASTY
Anesthesia: General | Site: Back

## 2017-10-20 MED ORDER — BUPIVACAINE-EPINEPHRINE (PF) 0.25% -1:200000 IJ SOLN
INTRAMUSCULAR | Status: AC
  Filled 2017-10-20: qty 30

## 2017-10-20 MED ORDER — PANTOPRAZOLE SODIUM 40 MG PO TBEC: 40.0000 mg | DELAYED_RELEASE_TABLET | Freq: Every day | ORAL | Status: DC

## 2017-10-20 MED ORDER — MUPIROCIN 2 % EX OINT
1.0000 "application " | TOPICAL_OINTMENT | Freq: Once | CUTANEOUS | Status: AC
  Administered 2017-10-20: 1 via TOPICAL

## 2017-10-20 MED ORDER — CEFAZOLIN SODIUM-DEXTROSE 2-4 GM/100ML-% IV SOLN
2.0000 g | INTRAVENOUS | Status: AC
  Administered 2017-10-20: 2 g via INTRAVENOUS
  Filled 2017-10-20: qty 100

## 2017-10-20 MED ORDER — MAGNESIUM CITRATE PO SOLN: 1.0000 | Freq: Once | ORAL | Status: DC | PRN

## 2017-10-20 MED ORDER — LACTATED RINGERS IV SOLN
INTRAVENOUS | Status: DC | PRN
  Administered 2017-10-20 (×2): via INTRAVENOUS

## 2017-10-20 MED ORDER — SODIUM CHLORIDE 0.9% FLUSH
3.0000 mL | Freq: Two times a day (BID) | INTRAVENOUS | Status: DC
  Administered 2017-10-20: 3 mL via INTRAVENOUS

## 2017-10-20 MED ORDER — METHOCARBAMOL 500 MG PO TABS
500.0000 mg | ORAL_TABLET | Freq: Four times a day (QID) | ORAL | Status: DC | PRN
  Administered 2017-10-20: 500 mg via ORAL
  Filled 2017-10-20: qty 1

## 2017-10-20 MED ORDER — CEFAZOLIN SODIUM-DEXTROSE 2-4 GM/100ML-% IV SOLN: 2.0000 g | Freq: Three times a day (TID) | INTRAVENOUS | Status: DC

## 2017-10-20 MED ORDER — CEFAZOLIN SODIUM-DEXTROSE 2-4 GM/100ML-% IV SOLN: 2.0000 g | INTRAVENOUS | Status: DC

## 2017-10-20 MED ORDER — ROCURONIUM BROMIDE 100 MG/10ML IV SOLN
INTRAVENOUS | Status: DC | PRN
  Administered 2017-10-20: 60 mg via INTRAVENOUS

## 2017-10-20 MED ORDER — LIDOCAINE HCL (CARDIAC) PF 100 MG/5ML IV SOSY
PREFILLED_SYRINGE | INTRAVENOUS | Status: DC | PRN
  Administered 2017-10-20: 100 mg via INTRAVENOUS

## 2017-10-20 MED ORDER — IOPAMIDOL (ISOVUE-300) INJECTION 61%
INTRAVENOUS | Status: AC
  Filled 2017-10-20: qty 50

## 2017-10-20 MED ORDER — LIDOCAINE 2% (20 MG/ML) 5 ML SYRINGE
INTRAMUSCULAR | Status: AC
  Filled 2017-10-20: qty 5

## 2017-10-20 MED ORDER — MIDAZOLAM HCL 5 MG/5ML IJ SOLN
INTRAMUSCULAR | Status: DC | PRN
  Administered 2017-10-20: 2 mg via INTRAVENOUS

## 2017-10-20 MED ORDER — MUPIROCIN 2 % EX OINT
TOPICAL_OINTMENT | CUTANEOUS | Status: AC
  Filled 2017-10-20: qty 22

## 2017-10-20 MED ORDER — ROCURONIUM BROMIDE 10 MG/ML (PF) SYRINGE
PREFILLED_SYRINGE | INTRAVENOUS | Status: AC
  Filled 2017-10-20: qty 10

## 2017-10-20 MED ORDER — LACTATED RINGERS IV SOLN: INTRAVENOUS | Status: DC

## 2017-10-20 MED ORDER — PHENYLEPHRINE 40 MCG/ML (10ML) SYRINGE FOR IV PUSH (FOR BLOOD PRESSURE SUPPORT)
PREFILLED_SYRINGE | INTRAVENOUS | Status: DC | PRN
  Administered 2017-10-20: 40 ug via INTRAVENOUS
  Administered 2017-10-20: 120 ug via INTRAVENOUS

## 2017-10-20 MED ORDER — FENTANYL CITRATE (PF) 250 MCG/5ML IJ SOLN
INTRAMUSCULAR | Status: AC
  Filled 2017-10-20: qty 5

## 2017-10-20 MED ORDER — AMLODIPINE BESYLATE 5 MG PO TABS: 5.0000 mg | ORAL_TABLET | Freq: Every day | ORAL | Status: DC

## 2017-10-20 MED ORDER — IOPAMIDOL (ISOVUE-300) INJECTION 61%
INTRAVENOUS | Status: DC | PRN
  Administered 2017-10-20: 50 mL

## 2017-10-20 MED ORDER — SODIUM CHLORIDE 0.9% FLUSH: 3.0000 mL | INTRAVENOUS | Status: DC | PRN

## 2017-10-20 MED ORDER — MENTHOL 3 MG MT LOZG: 1.0000 | LOZENGE | OROMUCOSAL | Status: DC | PRN

## 2017-10-20 MED ORDER — PROPOFOL 10 MG/ML IV BOLUS
INTRAVENOUS | Status: AC
  Filled 2017-10-20: qty 40

## 2017-10-20 MED ORDER — PROPOFOL 10 MG/ML IV BOLUS
INTRAVENOUS | Status: DC | PRN
  Administered 2017-10-20: 160 mg via INTRAVENOUS

## 2017-10-20 MED ORDER — EPHEDRINE SULFATE 50 MG/ML IJ SOLN
INTRAMUSCULAR | Status: DC | PRN
  Administered 2017-10-20: 20 mg via INTRAVENOUS
  Administered 2017-10-20: 10 mg via INTRAVENOUS

## 2017-10-20 MED ORDER — METHOCARBAMOL 500 MG PO TABS: 500.0000 mg | ORAL_TABLET | Freq: Three times a day (TID) | ORAL | 0 refills | Status: DC

## 2017-10-20 MED ORDER — FENTANYL CITRATE (PF) 100 MCG/2ML IJ SOLN
INTRAMUSCULAR | Status: AC
  Filled 2017-10-20: qty 2

## 2017-10-20 MED ORDER — FENTANYL CITRATE (PF) 100 MCG/2ML IJ SOLN
INTRAMUSCULAR | Status: DC | PRN
  Administered 2017-10-20: 100 ug via INTRAVENOUS
  Administered 2017-10-20: 50 ug via INTRAVENOUS

## 2017-10-20 MED ORDER — METOCLOPRAMIDE HCL 5 MG/ML IJ SOLN: 10.0000 mg | Freq: Once | INTRAMUSCULAR | Status: DC | PRN

## 2017-10-20 MED ORDER — FENTANYL CITRATE (PF) 100 MCG/2ML IJ SOLN
25.0000 ug | INTRAMUSCULAR | Status: DC | PRN
  Administered 2017-10-20: 25 ug via INTRAVENOUS

## 2017-10-20 MED ORDER — DOCUSATE SODIUM 100 MG PO CAPS: 100.0000 mg | ORAL_CAPSULE | Freq: Two times a day (BID) | ORAL | Status: DC

## 2017-10-20 MED ORDER — ONDANSETRON HCL 4 MG PO TABS: 4.0000 mg | ORAL_TABLET | Freq: Four times a day (QID) | ORAL | Status: DC | PRN

## 2017-10-20 MED ORDER — BUPIVACAINE LIPOSOME 1.3 % IJ SUSP
20.0000 mL | INTRAMUSCULAR | Status: DC
  Filled 2017-10-20: qty 20

## 2017-10-20 MED ORDER — BUPIVACAINE-EPINEPHRINE 0.25% -1:200000 IJ SOLN
INTRAMUSCULAR | Status: DC | PRN
  Administered 2017-10-20: 20 mL

## 2017-10-20 MED ORDER — METHOCARBAMOL 1000 MG/10ML IJ SOLN
500.0000 mg | Freq: Four times a day (QID) | INTRAVENOUS | Status: DC | PRN
  Filled 2017-10-20: qty 5

## 2017-10-20 MED ORDER — ONDANSETRON HCL 4 MG/2ML IJ SOLN: 4.0000 mg | Freq: Four times a day (QID) | INTRAMUSCULAR | Status: DC | PRN

## 2017-10-20 MED ORDER — PRAVASTATIN SODIUM 40 MG PO TABS: 40.0000 mg | ORAL_TABLET | Freq: Every day | ORAL | Status: DC

## 2017-10-20 MED ORDER — MEPERIDINE HCL 50 MG/ML IJ SOLN: 6.2500 mg | INTRAMUSCULAR | Status: DC | PRN

## 2017-10-20 MED ORDER — HYDROCODONE-ACETAMINOPHEN 5-325 MG PO TABS
1.0000 | ORAL_TABLET | ORAL | Status: DC | PRN
  Administered 2017-10-20: 1 via ORAL
  Filled 2017-10-20: qty 1

## 2017-10-20 MED ORDER — HYDROCODONE-ACETAMINOPHEN 5-325 MG PO TABS: 1.0000 | ORAL_TABLET | ORAL | 0 refills | Status: AC | PRN

## 2017-10-20 MED ORDER — SUGAMMADEX SODIUM 200 MG/2ML IV SOLN
INTRAVENOUS | Status: DC | PRN
  Administered 2017-10-20: 200 mg via INTRAVENOUS

## 2017-10-20 MED ORDER — POLYETHYLENE GLYCOL 3350 17 G PO PACK: 17.0000 g | PACK | Freq: Every day | ORAL | Status: DC | PRN

## 2017-10-20 MED ORDER — MIDAZOLAM HCL 2 MG/2ML IJ SOLN
INTRAMUSCULAR | Status: AC
  Filled 2017-10-20: qty 2

## 2017-10-20 MED ORDER — BUPIVACAINE LIPOSOME 1.3 % IJ SUSP
INTRAMUSCULAR | Status: DC | PRN
  Administered 2017-10-20: 20 mL

## 2017-10-20 MED ORDER — PHENOL 1.4 % MT LIQD: 1.0000 | OROMUCOSAL | Status: DC | PRN

## 2017-10-20 MED ORDER — ONDANSETRON 4 MG PO TBDP: 4.0000 mg | ORAL_TABLET | Freq: Three times a day (TID) | ORAL | 0 refills | Status: DC | PRN

## 2017-10-20 MED ORDER — ACETAMINOPHEN 650 MG RE SUPP: 650.0000 mg | RECTAL | Status: DC | PRN

## 2017-10-20 MED ORDER — ONDANSETRON HCL 4 MG/2ML IJ SOLN
INTRAMUSCULAR | Status: DC | PRN
  Administered 2017-10-20: 4 mg via INTRAVENOUS

## 2017-10-20 MED ORDER — 0.9 % SODIUM CHLORIDE (POUR BTL) OPTIME
TOPICAL | Status: DC | PRN
  Administered 2017-10-20 (×2): 1000 mL

## 2017-10-20 MED ORDER — ACETAMINOPHEN 325 MG PO TABS: 650.0000 mg | ORAL_TABLET | ORAL | Status: DC | PRN

## 2017-10-20 MED ORDER — SUCCINYLCHOLINE CHLORIDE 200 MG/10ML IV SOSY
PREFILLED_SYRINGE | INTRAVENOUS | Status: AC
  Filled 2017-10-20: qty 10

## 2017-10-20 SURGICAL SUPPLY — 53 items
BANDAGE ADH SHEER 1  50/CT (GAUZE/BANDAGES/DRESSINGS) ×6 IMPLANT
BLADE SURG 15 STRL LF DISP TIS (BLADE) ×1 IMPLANT
BLADE SURG 15 STRL SS (BLADE) ×2
BONE FILLER DEVICE STRL SZ3 (INSTRUMENTS) IMPLANT
CEMENT KYPHON CX01A KIT/MIXER (Cement) ×3 IMPLANT
COVER LIGHT HANDLE  DEROYL (MISCELLANEOUS) ×3 IMPLANT
COVER MAYO STAND STRL (DRAPES) ×6 IMPLANT
COVER SURGICAL LIGHT HANDLE (MISCELLANEOUS) ×3 IMPLANT
CURETTE WEDGE 8.5MM KYPHX (MISCELLANEOUS) IMPLANT
DECANTER SPIKE VIAL GLASS SM (MISCELLANEOUS) ×3 IMPLANT
DERMABOND ADHESIVE PROPEN (GAUZE/BANDAGES/DRESSINGS) ×2
DERMABOND ADVANCED (GAUZE/BANDAGES/DRESSINGS) ×2
DERMABOND ADVANCED .7 DNX12 (GAUZE/BANDAGES/DRESSINGS) ×1 IMPLANT
DERMABOND ADVANCED .7 DNX6 (GAUZE/BANDAGES/DRESSINGS) ×1 IMPLANT
DRAPE C-ARM 42X72 X-RAY (DRAPES) ×6 IMPLANT
DRAPE C-ARMOR (DRAPES) ×3 IMPLANT
DRAPE INCISE IOBAN 66X45 STRL (DRAPES) ×3 IMPLANT
DRAPE INCISE IOBAN 85X60 (DRAPES) ×3 IMPLANT
DRAPE LAPAROTOMY T 102X78X121 (DRAPES) ×3 IMPLANT
DRAPE OEC MINIVIEW 54X84 (DRAPES) IMPLANT
DRAPE SURG 17X23 STRL (DRAPES) ×3 IMPLANT
DRAPE U-SHAPE 47X51 STRL (DRAPES) ×3 IMPLANT
DRAPE WARM FLUID 44X44 (DRAPE) ×3 IMPLANT
DURAPREP 26ML APPLICATOR (WOUND CARE) ×3 IMPLANT
GAUZE SPONGE 4X4 16PLY XRAY LF (GAUZE/BANDAGES/DRESSINGS) ×3 IMPLANT
GLOVE BIO SURGEON STRL SZ 6.5 (GLOVE) ×2 IMPLANT
GLOVE BIO SURGEONS STRL SZ 6.5 (GLOVE) ×1
GLOVE BIOGEL PI IND STRL 6.5 (GLOVE) ×1 IMPLANT
GLOVE BIOGEL PI IND STRL 8.5 (GLOVE) ×1 IMPLANT
GLOVE BIOGEL PI INDICATOR 6.5 (GLOVE) ×2
GLOVE BIOGEL PI INDICATOR 8.5 (GLOVE) ×2
GLOVE SS BIOGEL STRL SZ 8.5 (GLOVE) ×1 IMPLANT
GLOVE SUPERSENSE BIOGEL SZ 8.5 (GLOVE) ×2
GOWN STRL REUS W/ TWL LRG LVL3 (GOWN DISPOSABLE) ×2 IMPLANT
GOWN STRL REUS W/TWL 2XL LVL3 (GOWN DISPOSABLE) ×3 IMPLANT
GOWN STRL REUS W/TWL LRG LVL3 (GOWN DISPOSABLE) ×4
KIT BASIN OR (CUSTOM PROCEDURE TRAY) ×3 IMPLANT
KIT TURNOVER KIT B (KITS) ×3 IMPLANT
MEDTRONIC KYPHONE XPEDE BONE CEMENT AND KYPHONE MIXER PACK ×3 IMPLANT
MIXER KYPHON (MISCELLANEOUS) ×3 IMPLANT
NEEDLE 21X1 OR PACK (NEEDLE) ×3 IMPLANT
NEEDLE HYPO 25X1 1.5 SAFETY (NEEDLE) ×3 IMPLANT
NEEDLE SPNL 18GX3.5 QUINCKE PK (NEEDLE) ×6 IMPLANT
NS IRRIG 1000ML POUR BTL (IV SOLUTION) ×6 IMPLANT
PACK SURGICAL SETUP 50X90 (CUSTOM PROCEDURE TRAY) ×3 IMPLANT
PACK UNIVERSAL I (CUSTOM PROCEDURE TRAY) ×3 IMPLANT
PAD ARMBOARD 7.5X6 YLW CONV (MISCELLANEOUS) ×6 IMPLANT
SPONGE LAP 4X18 RFD (DISPOSABLE) ×3 IMPLANT
SUT MNCRL AB 3-0 PS2 18 (SUTURE) ×3 IMPLANT
SYR CONTROL 10ML LL (SYRINGE) ×3 IMPLANT
TOWEL NATURAL 6PK STERILE (DISPOSABLE) ×3 IMPLANT
TOWEL OR 17X26 10 PK STRL BLUE (TOWEL DISPOSABLE) ×3 IMPLANT
TRAY KYPHOPAK 15/3 ONESTEP 1ST (MISCELLANEOUS) ×3 IMPLANT

## 2017-10-20 NOTE — Progress Notes (Signed)
Patient alert and oriented, mae's well, voiding adequate amount of urine, swallowing without difficulty, no c/o pain at time of discharge. Patient discharged home with friend. Script and discharged instructions given to patient. Patient and ffriend stated understanding of instructions given. Patient has an appointment with Dr. Rolena Infante in two weeks.

## 2017-10-20 NOTE — Evaluation (Signed)
Physical Therapy Evaluation Patient Details Name: [name] MRN: [id number] DOB: 1955/06/19 Today's Date: 10/20/2017   History of Present Illness  62 year old woman with a previous spinal cord stimulator implant well until recently when she had increased pain in the midthoracic region.  CT scan demonstrated T9 compression deformity no s/p kyphoplasty.  Clinical Impression  Patient seen for mobility assessment s/p spinal surgery. Mobilizing well. Educated patient on precautions, mobility expectations, safety and car transfers. No further acute PT needs. Will sign off.     Follow Up Recommendations No PT follow up    Equipment Recommendations  None recommended by PT    Recommendations for Other Services       Precautions / Restrictions Precautions Precautions: Back Precaution Booklet Issued: Yes (comment) Precaution Comments: handout provided and reviewed for adls with back precautions Restrictions Weight Bearing Restrictions: No      Mobility  Bed Mobility Overal bed mobility: Independent                Transfers Overall transfer level: Independent                  Ambulation/Gait Ambulation/Gait assistance: Independent Gait Distance (Feet): 410 Feet Assistive device: None Gait Pattern/deviations: WFL(Within Functional Limits)        Stairs Stairs: Yes Stairs assistance: Modified independent (Device/Increase time) Stair Management: One rail Right Number of Stairs: 4    Wheelchair Mobility    Modified Rankin (Stroke Patients Only)       Balance Overall balance assessment: Independent                                           Pertinent Vitals/Pain Pain Assessment: No/denies pain    Home Living Family/patient expects to be discharged to:: Private residence Living Arrangements: Spouse/significant other;Children Available Help at Discharge: Family Type of Home: House Home Access: Stairs to enter Entrance  Stairs-Rails: Right Entrance Stairs-Number of Steps: 14 Home Layout: Two level;Able to live on main level with bedroom/bathroom;Laundry or work area in [place]: Environmental consultant - 2 wheels;Cane - single point;Hand held shower head;Shower seat Additional Comments: spouse is wheelchair dependent. granddaughter is 23 and lives with patient.     Prior Function Level of Independence: Independent               Hand Dominance   Dominant Hand: Right    Extremity/Trunk Assessment   Upper Extremity Assessment Upper Extremity Assessment: Overall WFL for tasks assessed    Lower Extremity Assessment Lower Extremity Assessment: Overall WFL for tasks assessed    Cervical / Trunk Assessment Cervical / Trunk Assessment: Other exceptions(s/p surg)  Communication   Communication: No difficulties(wears dentures)  Cognition Arousal/Alertness: Awake/alert Behavior During Therapy: WFL for tasks assessed/performed Overall Cognitive Status: Within Functional Limits for tasks assessed                                        General Comments      Exercises     Assessment/Plan    PT Assessment Patent does not need any further PT services  PT Problem List         PT Treatment Interventions      PT Goals (Current goals can be found in the Care Plan section)  Acute Rehab PT Goals  Patient Stated Goal: to go home today.  PT Goal Formulation: All assessment and education complete, DC therapy    Frequency     Barriers to discharge        Co-evaluation               AM-PAC PT "6 Clicks" Daily Activity  Outcome Measure Difficulty turning over in bed (including adjusting bedclothes, sheets and blankets)?: None Difficulty moving from lying on back to sitting on the side of the bed? : None Difficulty sitting down on and standing up from a chair with arms (e.g., wheelchair, bedside commode, etc,.)?: None Help needed moving to and from a bed to chair  (including a wheelchair)?: None Help needed walking in hospital room?: None Help needed climbing 3-5 steps with a railing? : None 6 Click Score: 24    End of Session   Activity Tolerance: Patient tolerated treatment well Patient left: with family/visitor present Nurse Communication: Mobility status PT Visit Diagnosis: Difficulty in walking, not elsewhere classified (R26.2)    Time: 5462-7035 PT Time Calculation (min) (ACUTE ONLY): 11 min   Charges:   PT Evaluation $PT Eval Low Complexity: 1 Low     PT G Codes:        Alben Deeds, PT DPT  Board Certified Neurologic Specialist [place] 10/20/2017, 2:17 PM

## 2017-10-20 NOTE — Brief Op Note (Signed)
10/20/2017  9:27 AM  PATIENT:  [name]  62 y.o. female  PRE-OPERATIVE DIAGNOSIS:  T9 compression fracture  POST-OPERATIVE DIAGNOSIS:  T9 compression fracture  PROCEDURE:  Procedure(s): KYPHOPLASTY T9 (N/A)  SURGEON:  Surgeon(s) and Role:    Melina Schools, MD - Primary  PHYSICIAN ASSISTANT:   ASSISTANTS: Carmen Mayo   ANESTHESIA:   general  EBL:  none   BLOOD ADMINISTERED:none  DRAINS: none   LOCAL MEDICATIONS USED:  MARCAINE    and OTHER exparel  SPECIMEN:  No Specimen  DISPOSITION OF SPECIMEN:  N/A  COUNTS:  YES  TOURNIQUET:  * No tourniquets in log *  DICTATION: .Dragon Dictation  PLAN OF CARE: Admit for overnight observation  PATIENT DISPOSITION:  PACU - hemodynamically stable.

## 2017-10-20 NOTE — Evaluation (Signed)
Occupational Therapy Evaluation Patient Details Name: [name] MRN: [id number] DOB: [birth year] Today's Date: 10/20/2017    History of Present Illness 62 year old woman with a previous spinal cord stimulator implant well until recently when she had increased pain in the midthoracic region.  CT scan demonstrated T9 compression deformity no s/p kyphoplasty.   Clinical Impression   Patient evaluated by Occupational Therapy with no further acute OT needs identified. All education has been completed and the patient has no further questions. See below for any follow-up Occupational Therapy or equipment needs. OT to sign off. Thank you for referral.      Follow Up Recommendations  No OT follow up    Equipment Recommendations  None recommended by OT    Recommendations for Other Services       Precautions / Restrictions Precautions Precautions: Back Precaution Booklet Issued: Yes (comment) Precaution Comments: handout provided and reviewed for adls with back precautions      Mobility Bed Mobility Overal bed mobility: Independent                Transfers Overall transfer level: Independent                    Balance                                           ADL either performed or assessed with clinical judgement   ADL Overall ADL's : Independent                                       General ADL Comments: pt able to recall precautions from previous surg. pt complete dressing as precursor for home. pt demonstrates ability to compelte shower transfer but reports plan to sponge bath upon d/c. pt's best friend present and will drive patient      Vision         Perception     Praxis      Pertinent Vitals/Pain Pain Assessment: No/denies pain     Hand Dominance Right   Extremity/Trunk Assessment Upper Extremity Assessment Upper Extremity Assessment: Overall WFL for tasks assessed   Lower Extremity  Assessment Lower Extremity Assessment: Defer to PT evaluation   Cervical / Trunk Assessment Cervical / Trunk Assessment: Other exceptions(s/p surg)   Communication Communication Communication: No difficulties(wears dentures)   Cognition Arousal/Alertness: Awake/alert Behavior During Therapy: WFL for tasks assessed/performed Overall Cognitive Status: Within Functional Limits for tasks assessed                                     General Comments       Exercises     Shoulder Instructions      Home Living Family/patient expects to be discharged to:: Private residence Living Arrangements: Spouse/significant other;Children Available Help at Discharge: Family Type of Home: House Home Access: Stairs to enter Technical brewer of Steps: 14 Entrance Stairs-Rails: Right Home Layout: Two level;Able to live on main level with bedroom/bathroom;Laundry or work area in [place] Shower/Tub: Teacher, early years/pre: [place]: Environmental consultant - 2 wheels;Cane - single point;Hand held shower head;Shower seat   Additional Comments: spouse is  wheelchair dependent. granddaughter is 48 and lives with patient.       Prior Functioning/Environment Level of Independence: Independent                 OT Problem List:        OT Treatment/Interventions:      OT Goals(Current goals can be found in the care plan section) Acute Rehab OT Goals Patient Stated Goal: to go home today.  Potential to Achieve Goals: Good  OT Frequency:     Barriers to D/C:            Co-evaluation              AM-PAC PT "6 Clicks" Daily Activity     Outcome Measure Help from another person eating meals?: None Help from another person taking care of personal grooming?: None Help from another person toileting, which includes using toliet, bedpan, or urinal?: None Help from another person bathing (including washing, rinsing, drying)?: None Help  from another person to put on and taking off regular upper body clothing?: None Help from another person to put on and taking off regular lower body clothing?: None 6 Click Score: 24   End of Session Nurse Communication: Mobility status  Activity Tolerance: Patient tolerated treatment well Patient left: Other (comment)(with PT [name])  OT Visit Diagnosis: Unsteadiness on feet (R26.81)                Time: 4497-5300 OT Time Calculation (min): 8 min Charges:  OT General Charges $OT Visit: 1 Visit OT Evaluation $OT Eval Low Complexity: 1 Low G-CodesJeri Patterson   OTR/L Pager: [phone] Office: [phone] .   [name] 10/20/2017, 12:02 PM

## 2017-10-20 NOTE — Anesthesia Preprocedure Evaluation (Signed)
Anesthesia Evaluation  Patient identified by MRN, date of birth, ID band Patient awake    Reviewed: Allergy & Precautions, NPO status , Patient's Chart, lab work & pertinent test results  Airway Mallampati: II  TM Distance: >3 FB Neck ROM: Full    Dental  (+) Edentulous Upper, Edentulous Lower   Pulmonary COPD, Current Smoker,    Pulmonary exam normal breath sounds clear to auscultation       Cardiovascular hypertension, Pt. on medications + Peripheral Vascular Disease  Normal cardiovascular exam Rhythm:Regular Rate:Normal     Neuro/Psych  Headaches, TIA Neuromuscular disease    GI/Hepatic GERD  ,  Endo/Other  negative endocrine ROS  Renal/GU negative Renal ROS     Musculoskeletal   Abdominal   Peds  Hematology   Anesthesia Other Findings   Reproductive/Obstetrics                             Anesthesia Physical  Anesthesia Plan  ASA: II  Anesthesia Plan: General   Post-op Pain Management:    Induction: Intravenous  PONV Risk Score and Plan: 1 and Ondansetron  Airway Management Planned: Oral ETT  Additional Equipment:   Intra-op Plan:   Post-operative Plan: Extubation in OR  Informed Consent: I have reviewed the patients History and Physical, chart, labs and discussed the procedure including the risks, benefits and alternatives for the proposed anesthesia with the patient or authorized representative who has indicated his/her understanding and acceptance.   Dental advisory given  Plan Discussed with:   Anesthesia Plan Comments:         Anesthesia Quick Evaluation

## 2017-10-20 NOTE — Discharge Instructions (Signed)
Balloon Kyphoplasty, Care After °Refer to this sheet in the next few weeks. These instructions provide you with information about caring for yourself after your procedure. Your health care provider may also give you more specific instructions. Your treatment has been planned according to current medical practices, but problems sometimes occur. Call your health care provider if you have any problems or questions after your procedure. °What can I expect after the procedure? °After your procedure, it is common to have back pain. °Follow these instructions at home: °Incision care °· Follow instructions from your health care provider about how to take care of your incisions. Make sure you: °? Wash your hands with soap and water before you change your bandage (dressing). If soap and water are not available, use hand sanitizer. °? Change your dressing as told by your health care provider. °? Leave stitches (sutures), skin glue, or adhesive strips in place. These skin closures may need to be in place for 2 weeks or longer. If adhesive strip edges start to loosen and curl up, you may trim the loose edges. Do not remove adhesive strips completely unless your health care provider tells you to do that. °· Check your incision area every day for signs of infection. Watch for: °? Redness, swelling, or pain. °? Fluid, blood, or pus. °· Keep your dressing dry until your health care provider says that it can be removed. °Activity ° °· Rest your back and avoid intense physical activity for as long as told by your health care provider. °· Return to your normal activities as told by your health care provider. Ask your health care provider what activities are safe for you. °· Do not lift anything that is heavier than 10 lb (4.5 kg). This is about the weight of a gallon of milk. You may need to avoid heavy lifting for several weeks. °General instructions °· Take over-the-counter and prescription medicines only as told by your health care  provider. °· If directed, apply ice to the painful area: °? Put ice in a plastic bag. °? Place a towel between your skin and the bag. °? Leave the ice on for 20 minutes, 2-3 times per day. °· Do not use tobacco products, including cigarettes, chewing tobacco, or e-cigarettes. If you need help quitting, ask your health care provider. °· Keep all follow-up visits as told by your health care provider. This is important. °Contact a health care provider if: °· You have a fever. °· You have redness, swelling, or pain at the site of your incisions. °· You have fluid, blood, or pus coming from your incisions. °· You have pain that gets worse or does not get better with medicine. °· You develop numbness or weakness in any part of your body. °Get help right away if: °· You have chest pain. °· You have difficulty breathing. °· You cannot move your legs. °· You cannot control your bladder or bowel movements. °· You suddenly become weak or numb on one side of your body. °· You become very confused. °· You have trouble speaking or understanding, or both. °This information is not intended to replace advice given to you by your health care provider. Make sure you discuss any questions you have with your health care provider. °Document Released: 12/19/2014 Document Revised: 09/05/2015 Document Reviewed: 07/23/2014 °Elsevier Interactive Patient Education © 2018 [employer] ° °

## 2017-10-20 NOTE — Anesthesia Procedure Notes (Signed)
Procedure Name: Intubation Date/Time: 10/20/2017 8:40 AM Performed by: Candis Shine, CRNA Pre-anesthesia Checklist: Patient identified, Emergency Drugs available, Suction available and Patient being monitored Patient Re-evaluated:Patient Re-evaluated prior to induction Oxygen Delivery Method: Circle System Utilized Preoxygenation: Pre-oxygenation with 100% oxygen Induction Type: IV induction Ventilation: Mask ventilation without difficulty Laryngoscope Size: Miller and 2 Grade View: Grade I Tube type: Oral Tube size: 7.0 mm Number of attempts: 1 Airway Equipment and Method: Stylet Placement Confirmation: ETT inserted through vocal cords under direct vision,  positive ETCO2 and breath sounds checked- equal and bilateral Secured at: 22 cm Tube secured with: Tape Dental Injury: Teeth and Oropharynx as per pre-operative assessment

## 2017-10-20 NOTE — Anesthesia Postprocedure Evaluation (Signed)
Anesthesia Post Note  Patient: [name]  Procedure(s) Performed: KYPHOPLASTY T9 (N/A Back)     Patient location during evaluation: PACU Anesthesia Type: General Level of consciousness: awake and alert Pain management: pain level controlled Vital Signs Assessment: post-procedure vital signs reviewed and stable Respiratory status: spontaneous breathing, nonlabored ventilation, respiratory function stable and patient connected to nasal cannula oxygen Cardiovascular status: blood pressure returned to baseline and stable Postop Assessment: no apparent nausea or vomiting Anesthetic complications: no    Last Vitals:  Vitals:   10/20/17 1041 10/20/17 1102  BP: (!) 98/57 116/72  Pulse: 69 89  Resp: 15 18  Temp: (!) 36.3 C 36.5 C  SpO2: 94% 100%    Last Pain:  Vitals:   10/20/17 1102  TempSrc: Oral  PainSc:                  Montez Hageman

## 2017-10-20 NOTE — Op Note (Signed)
Operative report  Preoperative diagnosis: T9 osteoporotic compression fracture  Postoperative diagnosis: Same  Operative procedure: T9 kyphoplasty  First assistant: Ronette Deter, PA  Complications: None  Indications: The patient is a very pleasant 62 year old woman with a previous spinal cord stimulator implant well until recently when she had increased pain in the midthoracic region.  CT scan demonstrated T9 compression deformity.  Attempts at conservative management failed to alleviate her symptoms and so elected to move forward with a kyphoplasty.  All appropriate risks benefits and alternatives to surgery were discussed with the patient consent was obtained.  Operative report: Patient is brought the operating room placed upon the operating room table.  After successful induction of general anesthesia and endotracheal ablation teds SCDs were applied and she was turned prone onto a gel roll of the chest and pelvis.  All bony prominences well-padded and the axillary pads were placed.  The thoracic spine was then prepped and draped in a standard fashion.  Timeout was then taken to confirm patient procedure and all other important data.  Fluoroscopy machines were then sterilely prepped and brought into the field 1 in the lateral plane, the other in the AP.  I then identified the T9 compression deformity in both planes.  I then marked out the lateral aspect of the pedicle of T9 and then infiltrated this area with a combination of quarter percent Marcaine with Exparel.  A small stab incision was made and I advanced the Jamshidi needle down to the lateral aspect of the T9 pedicle.  Using an transpedicular approach I advanced the Jamshidi needle into the T9 pedicle.  As I near the medial wall of the pedicle on the AP view confirmed that I was just beyond the posterior wall of the vertebral body on the lateral view.  Narrowing that I was now within the vertebral body and my trajectory was appropriate I  advanced the Jamshidi needle into the T9 vertebral body.  I repeated this exact same procedure on the contralateral side.  Once both pedicles were cannulated I placed the drill and then probed the hole from I had a solid bony endplate.  Once this was confirmed I placed the inflatable bone tamps inflating.  Is an adequate amount of incidence inflation removed 1 of the balloons and inserted 1.5 cc of cement.  During the insertion of the cement I monitored its positioning in both planes with the fluoroscopy machines.  Remove the other balloon and inserted 1.5 cc of cement.  Again this was done using live fluoroscopy imaging confirm the cement remained within the vertebral body.  I then placed additional 0.5cc on both sides.  This brought a grand total of 4 cc total of cement used.  Fluoroscopy images in both the AP and lateral planes demonstrated no leak of the cement or laterally.  The cement was allowed to cure/harden and then the Jamshidi needles were removed.  The skin was cleaned and a simple 3-0 Monocryl stitch was placed at the site of the stab incision.  Dermabond and a Band-Aid were then applied.  Patient was then extubated transfer the PACU without incident.  The end of the case all needle sponge counts were correct.  There are no adverse intraoperative events.

## 2017-10-20 NOTE — Transfer of Care (Signed)
Immediate Anesthesia Transfer of Care Note  Patient: [name]  Procedure(s) Performed: KYPHOPLASTY T9 (N/A Back)  Patient Location: PACU  Anesthesia Type:General  Level of Consciousness: awake, alert  and oriented  Airway & Oxygen Therapy: Patient Spontanous Breathing and Patient connected to nasal cannula oxygen  Post-op Assessment: Report given to RN and Post -op Vital signs reviewed and stable  Post vital signs: Reviewed and stable  Last Vitals:  Vitals Value Taken Time  BP    Temp    Pulse 111 10/20/2017  9:40 AM  Resp 21 10/20/2017  9:40 AM  SpO2 99 % 10/20/2017  9:40 AM  Vitals shown include unvalidated device data.  Last Pain:  Vitals:   10/20/17 0710  TempSrc: Oral  PainSc: 5       Patients Stated Pain Goal: 2 (38/10/17 5102)  Complications: No apparent anesthesia complications

## 2017-10-21 ENCOUNTER — Encounter (HOSPITAL_COMMUNITY): Payer: Self-pay | Admitting: Orthopedic Surgery
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
## 2017-10-22 DIAGNOSIS — Z4789 Encounter for other orthopedic aftercare: Secondary | ICD-10-CM | POA: Diagnosis not present
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
## 2017-11-16 ENCOUNTER — Other Ambulatory Visit (HOSPITAL_COMMUNITY): Payer: Medicare Other

## 2017-11-22 ENCOUNTER — Encounter (HOSPITAL_COMMUNITY): Admission: RE | Payer: Self-pay | Source: Ambulatory Visit

## 2017-11-22 ENCOUNTER — Telehealth: Payer: Self-pay | Admitting: *Deleted

## 2017-11-22 ENCOUNTER — Ambulatory Visit (HOSPITAL_COMMUNITY): Admission: RE | Admit: 2017-11-22 | Payer: Medicare Other | Source: Ambulatory Visit | Admitting: Internal Medicine

## 2017-11-22 SURGERY — COLONOSCOPY WITH PROPOFOL
Anesthesia: Monitor Anesthesia Care

## 2017-11-22 MED ORDER — PROPOFOL 10 MG/ML IV BOLUS
INTRAVENOUS | Status: AC
  Filled 2017-11-22: qty 40

## 2017-11-22 NOTE — Telephone Encounter (Signed)
Received called from [place]. Patient did not show for procedure today.  Called patient and she stated she had back surgery last month and she is unable to go under anesthesia right now. She is scheduled to come back in for OV in November. FYI to LSL.   Called carolyn and made aware

## 2017-11-23 ENCOUNTER — Ambulatory Visit: Payer: Medicare Other | Admitting: Gastroenterology

## 2017-11-23 ENCOUNTER — Encounter

## 2017-11-23 NOTE — Telephone Encounter (Signed)
noted 

## 2017-11-30 DIAGNOSIS — M8008XD Age-related osteoporosis with current pathological fracture, vertebra(e), subsequent encounter for fracture with routine healing: Secondary | ICD-10-CM | POA: Diagnosis not present

## 2017-12-09 DIAGNOSIS — I10 Essential (primary) hypertension: Secondary | ICD-10-CM | POA: Diagnosis not present

## 2017-12-21 DIAGNOSIS — I10 Essential (primary) hypertension: Secondary | ICD-10-CM | POA: Diagnosis not present

## 2017-12-21 DIAGNOSIS — E876 Hypokalemia: Secondary | ICD-10-CM | POA: Diagnosis not present

## 2017-12-21 DIAGNOSIS — E782 Mixed hyperlipidemia: Secondary | ICD-10-CM | POA: Diagnosis not present

## 2017-12-29 DIAGNOSIS — G4700 Insomnia, unspecified: Secondary | ICD-10-CM | POA: Diagnosis not present

## 2017-12-29 DIAGNOSIS — Z23 Encounter for immunization: Secondary | ICD-10-CM | POA: Diagnosis not present

## 2017-12-29 DIAGNOSIS — E559 Vitamin D deficiency, unspecified: Secondary | ICD-10-CM | POA: Diagnosis not present

## 2017-12-29 DIAGNOSIS — I10 Essential (primary) hypertension: Secondary | ICD-10-CM | POA: Diagnosis not present

## 2017-12-29 DIAGNOSIS — E782 Mixed hyperlipidemia: Secondary | ICD-10-CM | POA: Diagnosis not present

## 2017-12-31 ENCOUNTER — Encounter (HOSPITAL_COMMUNITY): Payer: Self-pay

## 2017-12-31 ENCOUNTER — Other Ambulatory Visit: Payer: Self-pay

## 2017-12-31 ENCOUNTER — Emergency Department (HOSPITAL_COMMUNITY): Payer: Medicare Other

## 2017-12-31 ENCOUNTER — Emergency Department (HOSPITAL_COMMUNITY)
Admission: EM | Admit: 2017-12-31 | Discharge: 2018-01-01 | Disposition: A | Discharge status: Home / Self Care | Payer: Medicare Other | Attending: Emergency Medicine | Admitting: Emergency Medicine

## 2017-12-31 DIAGNOSIS — R5381 Other malaise: Secondary | ICD-10-CM

## 2017-12-31 DIAGNOSIS — I959 Hypotension, unspecified: Secondary | ICD-10-CM | POA: Diagnosis not present

## 2017-12-31 DIAGNOSIS — I998 Other disorder of circulatory system: Secondary | ICD-10-CM | POA: Diagnosis not present

## 2017-12-31 DIAGNOSIS — R0609 Other forms of dyspnea: Secondary | ICD-10-CM | POA: Diagnosis not present

## 2017-12-31 DIAGNOSIS — J449 Chronic obstructive pulmonary disease, unspecified: Secondary | ICD-10-CM | POA: Insufficient documentation

## 2017-12-31 DIAGNOSIS — F17210 Nicotine dependence, cigarettes, uncomplicated: Secondary | ICD-10-CM | POA: Diagnosis not present

## 2017-12-31 DIAGNOSIS — I10 Essential (primary) hypertension: Secondary | ICD-10-CM | POA: Diagnosis not present

## 2017-12-31 DIAGNOSIS — R918 Other nonspecific abnormal finding of lung field: Secondary | ICD-10-CM | POA: Diagnosis not present

## 2017-12-31 DIAGNOSIS — R000 Tachycardia, unspecified: Secondary | ICD-10-CM | POA: Diagnosis not present

## 2017-12-31 LAB — URINALYSIS, ROUTINE W REFLEX MICROSCOPIC
Bilirubin Urine: NEGATIVE
Glucose, UA: NEGATIVE mg/dL
Hgb urine dipstick: NEGATIVE
Ketones, ur: NEGATIVE mg/dL
Leukocytes, UA: NEGATIVE
NITRITE: NEGATIVE
Protein, ur: NEGATIVE mg/dL
Specific Gravity, Urine: 1.006 (ref 1.005–1.030)
pH: 6 (ref 5.0–8.0)

## 2017-12-31 LAB — COMPREHENSIVE METABOLIC PANEL
ALT: 18 U/L (ref 0–44)
AST: 21 U/L (ref 15–41)
Albumin: 4.4 g/dL (ref 3.5–5.0)
Alkaline Phosphatase: 90 U/L (ref 38–126)
Anion gap: 11 (ref 5–15)
BUN: 13 mg/dL (ref 8–23)
CHLORIDE: 102 mmol/L (ref 98–111)
CO2: 23 mmol/L (ref 22–32)
CREATININE: 0.83 mg/dL (ref 0.44–1.00)
Calcium: 8.9 mg/dL (ref 8.9–10.3)
Glucose, Bld: 115 mg/dL — ABNORMAL HIGH (ref 70–99)
POTASSIUM: 3.7 mmol/L (ref 3.5–5.1)
Sodium: 136 mmol/L (ref 135–145)
Total Bilirubin: 1 mg/dL (ref 0.3–1.2)
Total Protein: 7.4 g/dL (ref 6.5–8.1)

## 2017-12-31 LAB — CBC
HCT: 40.9 % (ref 36.0–46.0)
Hemoglobin: 13.8 g/dL (ref 12.0–15.0)
MCH: 31.2 pg (ref 26.0–34.0)
MCHC: 33.7 g/dL (ref 30.0–36.0)
MCV: 92.5 fL (ref 78.0–100.0)
Platelets: 163 10*3/uL (ref 150–400)
RBC: 4.42 MIL/uL (ref 3.87–5.11)
RDW: 14 % (ref 11.5–15.5)
WBC: 6.5 10*3/uL (ref 4.0–10.5)
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
## 2017-12-31 NOTE — Discharge Instructions (Addendum)
You were evaluated in the Emergency Department and after careful evaluation, we did not find any emergent condition requiring admission or further testing in the hospital.  Please return to the Emergency Department if you experience any worsening of your condition.  We encourage you to follow up with a primary care provider to discuss your blood pressure regiment.  Thank you for allowing [place] to be a part of your care.

## 2017-12-31 NOTE — ED Triage Notes (Signed)
Started new meds Wednesday

## 2017-12-31 NOTE — ED Triage Notes (Addendum)
Pt started new BP meds recently. Called EMS for fluctuating BP. Makes her feel tired/exhausted. Per EMS bp was originally 96/58 at [place] then last bp 117/68. 20g L  AC. A&Ox4

## 2018-01-01 DIAGNOSIS — I10 Essential (primary) hypertension: Secondary | ICD-10-CM | POA: Diagnosis not present

## 2018-01-01 DIAGNOSIS — Z7689 Persons encountering health services in other specified circumstances: Secondary | ICD-10-CM | POA: Diagnosis not present

## 2018-01-11 DIAGNOSIS — M546 Pain in thoracic spine: Secondary | ICD-10-CM | POA: Diagnosis not present

## 2018-02-01 DIAGNOSIS — M961 Postlaminectomy syndrome, not elsewhere classified: Secondary | ICD-10-CM | POA: Diagnosis not present

## 2018-02-01 DIAGNOSIS — M5416 Radiculopathy, lumbar region: Secondary | ICD-10-CM | POA: Diagnosis not present

## 2018-02-11 ENCOUNTER — Ambulatory Visit: Payer: Medicare Other | Admitting: Cardiology

## 2018-02-11 ENCOUNTER — Encounter: Payer: Self-pay | Admitting: Cardiology

## 2018-02-11 VITALS — BP 152/80 | HR 89 | Ht 61.0 in | Wt 158.0 lb

## 2018-02-11 DIAGNOSIS — I10 Essential (primary) hypertension: Secondary | ICD-10-CM

## 2018-02-11 DIAGNOSIS — R0989 Other specified symptoms and signs involving the circulatory and respiratory systems: Secondary | ICD-10-CM | POA: Diagnosis not present

## 2018-02-11 DIAGNOSIS — R011 Cardiac murmur, unspecified: Secondary | ICD-10-CM | POA: Diagnosis not present

## 2018-02-11 DIAGNOSIS — E782 Mixed hyperlipidemia: Secondary | ICD-10-CM | POA: Diagnosis not present

## 2018-02-11 DIAGNOSIS — Z720 Tobacco use: Secondary | ICD-10-CM | POA: Diagnosis not present

## 2018-02-11 MED ORDER — LOSARTAN POTASSIUM 25 MG PO TABS: 25.0000 mg | ORAL_TABLET | Freq: Every day | ORAL | 3 refills | Status: DC
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
## 2018-02-11 NOTE — Patient Instructions (Signed)
Medication Instructions:  START COZAAR 25 MG DAILY   Labwork: NONE  Testing/Procedures: Your physician has requested that you have a carotid duplex. This test is an ultrasound of the carotid arteries in your neck. It looks at blood flow through these arteries that supply the brain with blood. Allow one hour for this exam. There are no restrictions or special instructions.  Your physician has requested that you have an echocardiogram. Echocardiography is a painless test that uses sound waves to create images of your heart. It provides your doctor with information about the size and shape of your heart and how well your heart's chambers and valves are working. This procedure takes approximately one hour. There are no restrictions for this procedure.   Follow-Up: Your physician recommends that you schedule a follow-up appointment in: [place] physician recommends that you schedule a follow-up appointment in: 4-6 [place]     Any Other Special Instructions Will Be Listed Below (If Applicable).     If you need a refill on your cardiac medications before your next appointment, please call your pharmacy.

## 2018-02-17 ENCOUNTER — Ambulatory Visit (HOSPITAL_COMMUNITY): Admission: RE | Admit: 2018-02-17 | Payer: Medicare Other | Source: Ambulatory Visit

## 2018-02-17 ENCOUNTER — Ambulatory Visit (HOSPITAL_COMMUNITY)
Admission: RE | Admit: 2018-02-17 | Discharge: 2018-02-17 | Disposition: A | Discharge status: Home / Self Care | Payer: Medicare Other | Source: Ambulatory Visit | Attending: Internal Medicine | Admitting: Internal Medicine

## 2018-02-17 ENCOUNTER — Ambulatory Visit (HOSPITAL_COMMUNITY)
Admission: RE | Admit: 2018-02-17 | Discharge: 2018-02-17 | Disposition: A | Discharge status: Home / Self Care | Payer: Medicare Other | Source: Ambulatory Visit | Attending: Cardiology | Admitting: Cardiology

## 2018-02-17 DIAGNOSIS — R011 Cardiac murmur, unspecified: Secondary | ICD-10-CM | POA: Diagnosis not present

## 2018-02-17 DIAGNOSIS — I6523 Occlusion and stenosis of bilateral carotid arteries: Secondary | ICD-10-CM | POA: Diagnosis not present

## 2018-02-17 DIAGNOSIS — R0989 Other specified symptoms and signs involving the circulatory and respiratory systems: Secondary | ICD-10-CM

## 2018-02-17 NOTE — Progress Notes (Signed)
*  PRELIMINARY RESULTS* Echocardiogram 2D Echocardiogram has been performed.  [name] 02/17/2018, 2:27 PM

## 2018-02-18 DIAGNOSIS — M5416 Radiculopathy, lumbar region: Secondary | ICD-10-CM | POA: Diagnosis not present

## 2018-02-22 ENCOUNTER — Ambulatory Visit (INDEPENDENT_AMBULATORY_CARE_PROVIDER_SITE_OTHER): Payer: Medicare Other

## 2018-02-22 VITALS — BP 150/78 | HR 81 | Wt 160.0 lb

## 2018-02-22 DIAGNOSIS — I10 Essential (primary) hypertension: Secondary | ICD-10-CM

## 2018-02-22 NOTE — Progress Notes (Signed)
Pt presents today for blood pressure nurse appointment. She states she is feeling well, and she has taken her blood pressure at home and they have all been 150/78 range. She is not taking crestor at this time as she did not pick it up.

## 2018-02-23 NOTE — Progress Notes (Signed)
Her blood pressure in office was 150/78. She stated sh e had been taking her blood pressure at home daily and it has continually stayed around 150/78. She takes cozaar 25 mg in the morning along with the hydralazine- depending on her blood pressure. She takes the hydralazine 50 mg bid most days , but may take an extra 25 mg on occasion if her blood pressure is real high.

## 2018-02-23 NOTE — Progress Notes (Signed)
What was the blood pressure at the office visit?  How has she been taking her antihypertensive medications?  We started her on Cozaar 25 mg daily at our office visit, has she been on hydralazine as well or just the Cozaar?

## 2018-02-24 ENCOUNTER — Ambulatory Visit: Payer: Medicare Other

## 2018-02-28 ENCOUNTER — Ambulatory Visit: Payer: Medicare Other | Admitting: Gastroenterology

## 2018-02-28 ENCOUNTER — Telehealth: Payer: Self-pay | Admitting: *Deleted

## 2018-02-28 ENCOUNTER — Other Ambulatory Visit: Payer: Self-pay | Admitting: *Deleted

## 2018-02-28 ENCOUNTER — Encounter: Payer: Self-pay | Admitting: *Deleted

## 2018-02-28 ENCOUNTER — Encounter: Payer: Self-pay | Admitting: Gastroenterology

## 2018-02-28 VITALS — BP 144/81 | HR 97 | Temp 96.9°F | Ht 61.0 in | Wt 157.2 lb

## 2018-02-28 DIAGNOSIS — R197 Diarrhea, unspecified: Secondary | ICD-10-CM

## 2018-02-28 DIAGNOSIS — Z86010 Personal history of colonic polyps: Secondary | ICD-10-CM

## 2018-02-28 DIAGNOSIS — R1013 Epigastric pain: Secondary | ICD-10-CM

## 2018-02-28 DIAGNOSIS — R1310 Dysphagia, unspecified: Secondary | ICD-10-CM

## 2018-02-28 DIAGNOSIS — G8929 Other chronic pain: Secondary | ICD-10-CM

## 2018-02-28 DIAGNOSIS — R1319 Other dysphagia: Secondary | ICD-10-CM

## 2018-02-28 DIAGNOSIS — Z860101 Personal history of adenomatous and serrated colon polyps: Secondary | ICD-10-CM

## 2018-02-28 DIAGNOSIS — K580 Irritable bowel syndrome with diarrhea: Secondary | ICD-10-CM | POA: Diagnosis not present

## 2018-02-28 NOTE — Progress Notes (Signed)
CC'D TO PCP °

## 2018-02-28 NOTE — Assessment & Plan Note (Signed)
Presents back to reschedule her EGD with esophageal dilation to evaluate dysphagia to solid food/pills.  She has chronic epigastric pain as well.  Occasional strangling on her own saliva.  Plan for EGD with esophageal dilation in the near future, deep sedation given requirement of high-dose conscious sedation in the past.  I have discussed the risks, alternatives, benefits with regards to but not limited to the risk of reaction to medication, bleeding, infection, perforation and the patient is agreeable to proceed. Written consent to be obtained.  If EGD is unremarkable, continues to have persistent dysphagia, would consider barium esophagram plus or minus modified barium swallow study in the future.

## 2018-02-28 NOTE — Patient Instructions (Signed)
1. Colonoscopy and upper endoscopy as scheduled. Please see separate instructions. 

## 2018-02-28 NOTE — Telephone Encounter (Signed)
Pre-op scheduled for 04/11/18 at 11:00am. LMOVM. Letter mailed.

## 2018-02-28 NOTE — Assessment & Plan Note (Signed)
Suspected IBS-D, also with previous documented bacterial overgrowth but no improvement with antibiotic therapy.  She did well on Viberzi and stopped the medication due to FDA warning with postcholecystectomy patients.  She is due for colonoscopy for history of adenomatous colon polyps.  Plan for deep sedation, previously required higher dose medication for conscious sedation.  I have discussed the risks, alternatives, benefits with regards to but not limited to the risk of reaction to medication, bleeding, infection, perforation and the patient is agreeable to proceed. Written consent to be obtained.  Consider repeat biopsies to rule out microscopic colitis.  Last biopsies done remotely in 2009.
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
## 2018-03-04 DIAGNOSIS — M5416 Radiculopathy, lumbar region: Secondary | ICD-10-CM | POA: Diagnosis not present
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
## 2018-03-24 ENCOUNTER — Ambulatory Visit: Payer: Medicare Other | Admitting: Cardiology

## 2018-03-24 ENCOUNTER — Encounter: Payer: Self-pay | Admitting: Cardiology

## 2018-03-24 VITALS — BP 152/74 | HR 78 | Ht 61.0 in | Wt 158.0 lb

## 2018-03-24 DIAGNOSIS — Z720 Tobacco use: Secondary | ICD-10-CM

## 2018-03-24 DIAGNOSIS — I6523 Occlusion and stenosis of bilateral carotid arteries: Secondary | ICD-10-CM

## 2018-03-24 DIAGNOSIS — I10 Essential (primary) hypertension: Secondary | ICD-10-CM

## 2018-03-24 DIAGNOSIS — E782 Mixed hyperlipidemia: Secondary | ICD-10-CM

## 2018-03-24 MED ORDER — LOSARTAN POTASSIUM 50 MG PO TABS: 50.0000 mg | ORAL_TABLET | Freq: Every day | ORAL | 3 refills | Status: DC

## 2018-03-24 NOTE — Patient Instructions (Signed)
Medication Instructions: INCREASE Losartan to 50 mg daily  If you need a refill on your cardiac medications before your next appointment, please call your pharmacy.   Lab work: None If you have labs (blood work) drawn today and your tests are completely normal, you will receive your results only by: Marland Kitchen MyChart Message (if you have MyChart) OR . A paper copy in the mail If you have any lab test that is abnormal or we need to change your treatment, we will call you to review the results.  Testing/Procedures: None  Follow-Up: At [facility], you and your health needs are our priority.  As part of our continuing mission to provide you with exceptional heart care, we have created designated Provider Care Teams.  These Care Teams include your primary Cardiologist (physician) and Advanced Practice Providers (APPs -  Physician Assistants and Nurse Practitioners) who all work together to provide you with the care you need, when you need it. You will need a follow up appointment in 6-8 weeks You may see Rozann Lesches, MD or one of the following Advanced Practice Providers on your designated Care Team:   Bernerd Pho, PA-C [facility]) . Ermalinda Barrios, PA-C ([place])  Any Other Special Instructions Will Be Listed Below (If Applicable). None

## 2018-03-30 NOTE — Patient Instructions (Signed)
[place]  03/30/2018     @PREFPERIOPPHARMACY @   Your procedure is scheduled on  04/14/2018.  Report to [facility] at  [place].  Call this number if you have problems the morning of surgery:  [phone]   Remember:  Follow the diet and prep instructions given to you by Dr Roseanne Kaufman office.                    Take these medicines the morning of surgery with A SIP OF WATER  Losartan.    Do not wear jewelry, make-up or nail polish.  Do not wear lotions, powders, or perfumes, or deodorant.  Do not shave 48 hours prior to surgery.  Men may shave face and neck.  Do not bring valuables to the hospital.  [facility] is not responsible for any belongings or valuables.  Contacts, dentures or bridgework may not be worn into surgery.  Leave your suitcase in the car.  After surgery it may be brought to your room.  For patients admitted to the hospital, discharge time will be determined by your treatment team.  Patients discharged the day of surgery will not be allowed to drive home.   Name and phone number of your driver:   family Special instructions:  None  Please read over the following fact sheets that you were given. Anesthesia Post-op Instructions and Care and Recovery After Surgery       Esophagogastroduodenoscopy Esophagogastroduodenoscopy (EGD) is a procedure to examine the lining of the esophagus, stomach, and first part of the small intestine (duodenum). This procedure is done to check for problems such as inflammation, bleeding, ulcers, or growths. During this procedure, a long, flexible, lighted tube with a camera attached (endoscope) is inserted down the throat. Tell a health care provider about:  Any allergies you have.  All medicines you are taking, including vitamins, herbs, eye drops, creams, and over-the-counter medicines.  Any problems you or family members have had with anesthetic medicines.  Any blood disorders you  have.  Any surgeries you have had.  Any medical conditions you have.  Whether you are pregnant or may be pregnant. What are the risks? Generally, this is a safe procedure. However, problems may occur, including:  Infection.  Bleeding.  A tear (perforation) in the esophagus, stomach, or duodenum.  Trouble breathing.  Excessive sweating.  Spasms of the larynx.  A slowed heartbeat.  Low blood pressure.  What happens before the procedure?  Follow instructions from your health care provider about eating or drinking restrictions.  Ask your health care provider about: ? Changing or stopping your regular medicines. This is especially important if you are taking diabetes medicines or blood thinners. ? Taking medicines such as aspirin and ibuprofen. These medicines can thin your blood. Do not take these medicines before your procedure if your health care provider instructs you not to.  Plan to have someone take you home after the procedure.  If you wear dentures, be ready to remove them before the procedure. What happens during the procedure?  To reduce your risk of infection, your health care team will wash or sanitize their hands.  An IV tube will be put in a vein in your hand or arm. You will get medicines and fluids through this tube.  You will be given one or more of the following: ? A medicine to help you  relax (sedative). ? A medicine to numb the area (local anesthetic). This medicine may be sprayed into your throat. It will make you feel more comfortable and keep you from gagging or coughing during the procedure. ? A medicine for pain.  A mouth guard may be placed in your mouth to protect your teeth and to keep you from biting on the endoscope.  You will be asked to lie on your left side.  The endoscope will be lowered down your throat into your esophagus, stomach, and duodenum.  Air will be put into the endoscope. This will help your health care provider see  better.  The lining of your esophagus, stomach, and duodenum will be examined.  Your health care provider may: ? Take a tissue sample so it can be looked at in a lab (biopsy). ? Remove growths. ? Remove objects (foreign bodies) that are stuck. ? Treat any bleeding with medicines or other devices that stop tissue from bleeding. ? Widen (dilate) or stretch narrowed areas of your esophagus and stomach.  The endoscope will be taken out. The procedure may vary among health care providers and hospitals. What happens after the procedure?  Your blood pressure, heart rate, breathing rate, and blood oxygen level will be monitored often until the medicines you were given have worn off.  Do not eat or drink anything until the numbing medicine has worn off and your gag reflex has returned. This information is not intended to replace advice given to you by your health care provider. Make sure you discuss any questions you have with your health care provider. Document Released: 07/31/2004 Document Revised: 09/05/2015 Document Reviewed: 02/21/2015 Elsevier Interactive Patient Education  2018 [employer]. Esophagogastroduodenoscopy, Care After Refer to this sheet in the next few weeks. These instructions provide you with information about caring for yourself after your procedure. Your health care provider may also give you more specific instructions. Your treatment has been planned according to current medical practices, but problems sometimes occur. Call your health care provider if you have any problems or questions after your procedure. What can I expect after the procedure? After the procedure, it is common to have:  A sore throat.  Nausea.  Bloating.  Dizziness.  Fatigue.  Follow these instructions at home:  Do not eat or drink anything until the numbing medicine (local anesthetic) has worn off and your gag reflex has returned. You will know that the local anesthetic has worn off when you  can swallow comfortably.  Do not drive for 24 hours if you received a medicine to help you relax (sedative).  If your health care provider took a tissue sample for testing during the procedure, make sure to get your test results. This is your responsibility. Ask your health care provider or the department performing the test when your results will be ready.  Keep all follow-up visits as told by your health care provider. This is important. Contact a health care provider if:  You cannot stop coughing.  You are not urinating.  You are urinating less than usual. Get help right away if:  You have trouble swallowing.  You cannot eat or drink.  You have throat or chest pain that gets worse.  You are dizzy or light-headed.  You faint.  You have nausea or vomiting.  You have chills.  You have a fever.  You have severe abdominal pain.  You have black, tarry, or bloody stools. This information is not intended to replace advice given to you  by your health care provider. Make sure you discuss any questions you have with your health care provider. Document Released: 03/16/2012 Document Revised: 09/05/2015 Document Reviewed: 02/21/2015 Elsevier Interactive Patient Education  2018 [employer].  Esophageal Dilatation Esophageal dilatation, also called esophageal dilation, is a procedure to widen or open (dilate) a blocked or narrowed part of the esophagus. The esophagus is the part of the body that moves food and liquid from the mouth to the stomach. You may need this procedure if:  You have a buildup of scar tissue in your esophagus that makes it difficult, painful, or impossible to swallow. This can be caused by gastroesophageal reflux disease (GERD).  You have cancer of the esophagus.  There is a problem with how food moves through your esophagus. In some cases, you may need this procedure repeated at a later time to dilate the esophagus gradually. Tell a health care provider  about:  Any allergies you have.  All medicines you are taking, including vitamins, herbs, eye drops, creams, and over-the-counter medicines.  Any problems you or family members have had with anesthetic medicines.  Any blood disorders you have.  Any surgeries you have had.  Any medical conditions you have.  Any antibiotic medicines you are required to take before dental procedures.  Whether you are pregnant or may be pregnant. What are the risks? Generally, this is a safe procedure. However, problems may occur, including:  Bleeding due to a tear in the lining of the esophagus.  A hole (perforation) in the esophagus. What happens before the procedure?  Follow instructions from your health care provider about eating or drinking restrictions.  Ask your health care provider about changing or stopping your regular medicines. This is especially important if you are taking diabetes medicines or blood thinners.  Plan to have someone take you home from the hospital or clinic.  Plan to have a responsible adult care for you for at least 24 hours after you leave the hospital or clinic. This is important. What happens during the procedure?  You may be given a medicine to help you relax (sedative).  A numbing medicine may be sprayed into the back of your throat, or you may gargle the medicine.  Your health care provider may perform the dilatation using various surgical instruments, such as: ? Simple dilators. This instrument is carefully placed in the esophagus to stretch it. ? Guided wire bougies. This involves using an endoscope to insert a wire into the esophagus. A dilator is passed over this wire to enlarge the esophagus. Then the wire is removed. ? Balloon dilators. An endoscope with a small balloon at the end is inserted into the esophagus. The balloon is inflated to stretch the esophagus and open it up. The procedure may vary among health care providers and hospitals. What happens  after the procedure?  Your blood pressure, heart rate, breathing rate, and blood oxygen level will be monitored until the medicines you were given have worn off.  Your throat may feel slightly sore and numb. This will improve slowly over time.  You will not be allowed to eat or drink until your throat is no longer numb.  When you are able to drink, urinate, and sit on the edge of the bed without nausea or dizziness, you may be able to return home. Follow these instructions at home:  Take over-the-counter and prescription medicines only as told by your health care provider.  Do not drive for 24 hours if you were given  a sedative during your procedure.  You should have a responsible adult with you for 24 hours after the procedure.  Follow instructions from your health care provider about any eating or drinking restrictions.  Do not use any products that contain nicotine or tobacco, such as cigarettes and e-cigarettes. If you need help quitting, ask your health care provider.  Keep all follow-up visits as told by your health care provider. This is important. Get help right away if you:  Have a fever.  Have chest pain.  Have pain that is not relieved by medication.  Have trouble breathing.  Have trouble swallowing.  Vomit blood. Summary  Esophageal dilatation, also called esophageal dilation, is a procedure to widen or open (dilate) a blocked or narrowed part of the esophagus.  Plan to have someone take you home from the hospital or clinic.  For this procedure, a numbing medicine may be sprayed into the back of your throat, or you may gargle the medicine.  Do not drive for 24 hours if you were given a sedative during your procedure. This information is not intended to replace advice given to you by your health care provider. Make sure you discuss any questions you have with your health care provider. Document Released: 05/21/2005 Document Revised: 02/02/2017 Document  Reviewed: 02/02/2017 Elsevier Interactive Patient Education  2019 [employer].  Colonoscopy, Adult A colonoscopy is an exam to look at the large intestine. It is done to check for problems, such as:  Lumps (tumors).  Growths (polyps).  Swelling (inflammation).  Bleeding. What happens before the procedure? Eating and drinking Follow instructions from your doctor about eating and drinking. These instructions may include:  A few days before the procedure - follow a low-fiber diet. ? Avoid nuts. ? Avoid seeds. ? Avoid dried fruit. ? Avoid raw fruits. ? Avoid vegetables.  1-3 days before the procedure - follow a clear liquid diet. Avoid liquids that have red or purple dye. Drink only clear liquids, such as: ? Clear broth or bouillon. ? Black coffee or tea. ? Clear juice. ? Clear soft drinks or sports drinks. ? Gelatin dessert. ? Popsicles.  On the day of the procedure - do not eat or drink anything during the 2 hours before the procedure. Up to 2 hours before the procedure, you may continue to drink clear liquids, such as water or clear fruit juice.  Bowel prep If you were prescribed an oral bowel prep:  Take it as told by your doctor. Starting the day before your procedure, you will need to drink a lot of liquid. The liquid will cause you to poop (have bowel movements) until your poop is almost clear or light green.  To clean out your colon, you may also be given: ? Laxative medicines. ? Instructions about how to use an enema.  If your skin or butt gets irritated from diarrhea, you may: ? Wipe the area with wipes that have medicine in them, such as adult wet wipes with aloe and vitamin E. ? Put something on your skin that soothes the area, such as petroleum jelly.  If you throw up (vomit) while drinking the bowel prep, take a break for up to 60 minutes. Then begin the bowel prep again. If you keep throwing up and you cannot take the bowel prep without throwing up, call  your doctor. General instructions  Ask your doctor about: ? Changing or stopping your normal medicines. This is important if you take iron pills, diabetes medicines, or blood  thinners. ? Taking medicines such as aspirin and ibuprofen. These medicines can thin your blood. Do not take these medicines unless your doctor tells you to take them.  Plan to have someone take you home from the hospital or clinic. What happens during the procedure?   An IV tube may be put into one of your veins.  You will be given medicine to help you relax (sedative).  To reduce your risk of infection: ? Your doctors will wash their hands. ? Your anal area will be washed with soap.  You will be asked to lie on your side with your knees bent.  Your doctor will get a long, thin, flexible tube ready. The tube will have a camera and a light on the end.  The tube will be put into your anus.  The tube will be gently put into your large intestine.  Air will be delivered into your large intestine to keep it open. You may feel some pressure or cramping.  The camera will be used to take photos.  A small tissue sample may be removed for testing (biopsy).  If small growths are found, your doctor may remove them and have them checked for cancer.  The tube that was put into your anus will be slowly removed. The procedure may vary among doctors and hospitals. What happens after the procedure?  Your doctor will check on you often until the medicines you were given have worn off.  Do not drive for 24 hours after the procedure.  You may have a small amount of blood in your poop.  You may pass gas.  You may have mild cramps or bloating in your belly (abdomen).  It is up to you to get the results of your procedure. Ask your doctor, or the department performing the procedure, when your results will be ready. Summary  A colonoscopy is an exam to look at the large intestine.  Follow instructions from your  doctor about eating and drinking before the procedure.  If you were prescribed an oral bowel prep to clean out your colon, take it as told by your doctor.  Your doctor will check on you often until the medicines you were given have worn off.  Plan to have someone take you home from the hospital or clinic. This information is not intended to replace advice given to you by your health care provider. Make sure you discuss any questions you have with your health care provider. Document Released: 05/02/2010 Document Revised: 01/27/2017 Document Reviewed: 06/11/2015 Elsevier Interactive Patient Education  2019 [employer]  Colonoscopy, Adult, Care After This sheet gives you information about how to care for yourself after your procedure. Your health care provider may also give you more specific instructions. If you have problems or questions, contact your health care provider. What can I expect after the procedure? After the procedure, it is common to have:  A small amount of blood in your stool for 24 hours after the procedure.  Some gas.  Mild abdominal cramping or bloating. Follow these instructions at home: General instructions  For the first 24 hours after the procedure: ? Do not drive or use machinery. ? Do not sign important documents. ? Do not drink alcohol. ? Do your regular daily activities at a slower pace than normal. ? Eat soft, easy-to-digest foods.  Take over-the-counter or prescription medicines only as told by your health care provider. Relieving cramping and bloating   Try walking around when you have cramps or  feel bloated.  Apply heat to your abdomen as told by your health care provider. Use a heat source that your health care provider recommends, such as a moist heat pack or a heating pad. ? Place a towel between your skin and the heat source. ? Leave the heat on for 20-30 minutes. ? Remove the heat if your skin turns bright red. This is especially important  if you are unable to feel pain, heat, or cold. You may have a greater risk of getting burned. Eating and drinking   Drink enough fluid to keep your urine pale yellow.  Resume your normal diet as instructed by your health care provider. Avoid heavy or fried foods that are hard to digest.  Avoid drinking alcohol for as long as instructed by your health care provider. Contact a health care provider if:  You have blood in your stool 2-3 days after the procedure. Get help right away if:  You have more than a small spotting of blood in your stool.  You pass large blood clots in your stool.  Your abdomen is swollen.  You have nausea or vomiting.  You have a fever.  You have increasing abdominal pain that is not relieved with medicine. Summary  After the procedure, it is common to have a small amount of blood in your stool. You may also have mild abdominal cramping and bloating.  For the first 24 hours after the procedure, do not drive or use machinery, sign important documents, or drink alcohol.  Contact your health care provider if you have a lot of blood in your stool, nausea or vomiting, a fever, or increased abdominal pain. This information is not intended to replace advice given to you by your health care provider. Make sure you discuss any questions you have with your health care provider. Document Released: 11/12/2003 Document Revised: 01/20/2017 Document Reviewed: 06/11/2015 Elsevier Interactive Patient Education  2019 [employer] Anesthesia is a term that refers to techniques, procedures, and medicines that help a person stay safe and comfortable during a medical procedure. Monitored anesthesia care, or sedation, is one type of anesthesia. Your anesthesia specialist may recommend sedation if you will be having a procedure that does not require you to be unconscious, such as:  Cataract surgery.  A dental procedure.  A biopsy.  A  colonoscopy. During the procedure, you may receive a medicine to help you relax (sedative). There are three levels of sedation:  Mild sedation. At this level, you may feel awake and relaxed. You will be able to follow directions.  Moderate sedation. At this level, you will be sleepy. You may not remember the procedure.  Deep sedation. At this level, you will be asleep. You will not remember the procedure. The more medicine you are given, the deeper your level of sedation will be. Depending on how you respond to the procedure, the anesthesia specialist may change your level of sedation or the type of anesthesia to fit your needs. An anesthesia specialist will monitor you closely during the procedure. Let your health care provider know about:  Any allergies you have.  All medicines you are taking, including vitamins, herbs, eye drops, creams, and over-the-counter medicines.  Any use of steroids (by mouth or as a cream).  Any problems you or family members have had with sedatives and anesthetic medicines.  Any blood disorders you have.  Any surgeries you have had.  Any medical conditions you have, such as sleep apnea.  Whether  you are pregnant or may be pregnant.  Any use of cigarettes, alcohol, or street drugs. What are the risks? Generally, this is a safe procedure. However, problems may occur, including:  Getting too much medicine (oversedation).  Nausea.  Allergic reaction to medicines.  Trouble breathing. If this happens, a breathing tube may be used to help with breathing. It will be removed when you are awake and breathing on your own.  Heart trouble.  Lung trouble. Before the procedure Staying hydrated Follow instructions from your health care provider about hydration, which may include:  Up to 2 hours before the procedure - you may continue to drink clear liquids, such as water, clear fruit juice, black coffee, and plain tea. Eating and drinking  restrictions Follow instructions from your health care provider about eating and drinking, which may include:  8 hours before the procedure - stop eating heavy meals or foods such as meat, fried foods, or fatty foods.  6 hours before the procedure - stop eating light meals or foods, such as toast or cereal.  6 hours before the procedure - stop drinking milk or drinks that contain milk.  2 hours before the procedure - stop drinking clear liquids. Medicines Ask your health care provider about:  Changing or stopping your regular medicines. This is especially important if you are taking diabetes medicines or blood thinners.  Taking medicines such as aspirin and ibuprofen. These medicines can thin your blood. Do not take these medicines before your procedure if your health care provider instructs you not to. Tests and exams  You will have a physical exam.  You may have blood tests done to show: ? How well your kidneys and liver are working. ? How well your blood can clot. General instructions  Plan to have someone take you home from the hospital or clinic.  If you will be going home right after the procedure, plan to have someone with you for 24 hours.  What happens during the procedure?  Your blood pressure, heart rate, breathing, level of pain and overall condition will be monitored.  An IV tube will be inserted into one of your veins.  Your anesthesia specialist will give you medicines as needed to keep you comfortable during the procedure. This may mean changing the level of sedation.  The procedure will be performed. After the procedure  Your blood pressure, heart rate, breathing rate, and blood oxygen level will be monitored until the medicines you were given have worn off.  Do not drive for 24 hours if you received a sedative.  You may: ? Feel sleepy, clumsy, or nauseous. ? Feel forgetful about what happened after the procedure. ? Have a sore throat if you had a  breathing tube during the procedure. ? Vomit. This information is not intended to replace advice given to you by your health care provider. Make sure you discuss any questions you have with your health care provider. Document Released: 12/24/2004 Document Revised: 09/06/2015 Document Reviewed: 07/21/2015 Elsevier Interactive Patient Education  2019 [place], Care After These instructions provide you with information about caring for yourself after your procedure. Your health care provider may also give you more specific instructions. Your treatment has been planned according to current medical practices, but problems sometimes occur. Call your health care provider if you have any problems or questions after your procedure. What can I expect after the procedure? After your procedure, you may:  Feel sleepy for several hours.  Feel clumsy and  have poor balance for several hours.  Feel forgetful about what happened after the procedure.  Have poor judgment for several hours.  Feel nauseous or vomit.  Have a sore throat if you had a breathing tube during the procedure. Follow these instructions at home: For at least 24 hours after the procedure:      Have a responsible adult stay with you. It is important to have someone help care for you until you are awake and alert.  Rest as needed.  Do not: ? Participate in activities in which you could fall or become injured. ? Drive. ? Use heavy machinery. ? Drink alcohol. ? Take sleeping pills or medicines that cause drowsiness. ? Make important decisions or sign legal documents. ? Take care of children on your own. Eating and drinking  Follow the diet that is recommended by your health care provider.  If you vomit, drink water, juice, or soup when you can drink without vomiting.  Make sure you have little or no nausea before eating solid foods. General instructions  Take over-the-counter and  prescription medicines only as told by your health care provider.  If you have sleep apnea, surgery and certain medicines can increase your risk for breathing problems. Follow instructions from your health care provider about wearing your sleep device: ? Anytime you are sleeping, including during daytime naps. ? While taking prescription pain medicines, sleeping medicines, or medicines that make you drowsy.  If you smoke, do not smoke without supervision.  Keep all follow-up visits as told by your health care provider. This is important. Contact a health care provider if:  You keep feeling nauseous or you keep vomiting.  You feel light-headed.  You develop a rash.  You have a fever. Get help right away if:  You have trouble breathing. Summary  For several hours after your procedure, you may feel sleepy and have poor judgment.  Have a responsible adult stay with you for at least 24 hours or until you are awake and alert. This information is not intended to replace advice given to you by your health care provider. Make sure you discuss any questions you have with your health care provider. Document Released: 07/21/2015 Document Revised: 11/13/2016 Document Reviewed: 07/21/2015 Elsevier Interactive Patient Education  2019 [employer].

## 2018-04-11 ENCOUNTER — Other Ambulatory Visit: Payer: Self-pay

## 2018-04-11 ENCOUNTER — Encounter (HOSPITAL_COMMUNITY)
Admission: RE | Admit: 2018-04-11 | Discharge: 2018-04-11 | Disposition: A | Discharge status: Home / Self Care | Payer: Medicare Other | Source: Ambulatory Visit | Attending: Internal Medicine | Admitting: Internal Medicine

## 2018-04-11 ENCOUNTER — Encounter (HOSPITAL_COMMUNITY): Payer: Self-pay

## 2018-04-14 ENCOUNTER — Ambulatory Visit (HOSPITAL_COMMUNITY): Payer: Medicare HMO | Admitting: Anesthesiology

## 2018-04-14 ENCOUNTER — Ambulatory Visit (HOSPITAL_COMMUNITY)
Admission: RE | Admit: 2018-04-14 | Discharge: 2018-04-14 | Disposition: A | Discharge status: Home / Self Care | Payer: Medicare HMO | Attending: Internal Medicine | Admitting: Internal Medicine

## 2018-04-14 ENCOUNTER — Encounter (HOSPITAL_COMMUNITY)
Admission: RE | Disposition: A | Discharge status: Home / Self Care | Payer: Self-pay | Source: Home / Self Care | Attending: Internal Medicine

## 2018-04-14 ENCOUNTER — Other Ambulatory Visit: Payer: Self-pay

## 2018-04-14 ENCOUNTER — Encounter (HOSPITAL_COMMUNITY): Payer: Self-pay | Admitting: *Deleted

## 2018-04-14 DIAGNOSIS — Z1211 Encounter for screening for malignant neoplasm of colon: Secondary | ICD-10-CM | POA: Diagnosis not present

## 2018-04-14 DIAGNOSIS — I739 Peripheral vascular disease, unspecified: Secondary | ICD-10-CM | POA: Diagnosis not present

## 2018-04-14 DIAGNOSIS — D123 Benign neoplasm of transverse colon: Secondary | ICD-10-CM | POA: Diagnosis not present

## 2018-04-14 DIAGNOSIS — J449 Chronic obstructive pulmonary disease, unspecified: Secondary | ICD-10-CM | POA: Diagnosis not present

## 2018-04-14 DIAGNOSIS — F17210 Nicotine dependence, cigarettes, uncomplicated: Secondary | ICD-10-CM | POA: Insufficient documentation

## 2018-04-14 DIAGNOSIS — E785 Hyperlipidemia, unspecified: Secondary | ICD-10-CM | POA: Insufficient documentation

## 2018-04-14 DIAGNOSIS — R1310 Dysphagia, unspecified: Secondary | ICD-10-CM | POA: Diagnosis not present

## 2018-04-14 DIAGNOSIS — D122 Benign neoplasm of ascending colon: Secondary | ICD-10-CM | POA: Diagnosis not present

## 2018-04-14 DIAGNOSIS — R197 Diarrhea, unspecified: Secondary | ICD-10-CM

## 2018-04-14 DIAGNOSIS — G8929 Other chronic pain: Secondary | ICD-10-CM

## 2018-04-14 DIAGNOSIS — R1319 Other dysphagia: Secondary | ICD-10-CM

## 2018-04-14 DIAGNOSIS — K642 Third degree hemorrhoids: Secondary | ICD-10-CM | POA: Insufficient documentation

## 2018-04-14 DIAGNOSIS — I10 Essential (primary) hypertension: Secondary | ICD-10-CM | POA: Insufficient documentation

## 2018-04-14 DIAGNOSIS — Z79899 Other long term (current) drug therapy: Secondary | ICD-10-CM | POA: Insufficient documentation

## 2018-04-14 DIAGNOSIS — Z86010 Personal history of colonic polyps: Secondary | ICD-10-CM | POA: Insufficient documentation

## 2018-04-14 DIAGNOSIS — K621 Rectal polyp: Secondary | ICD-10-CM | POA: Diagnosis not present

## 2018-04-14 DIAGNOSIS — R1013 Epigastric pain: Secondary | ICD-10-CM

## 2018-04-14 DIAGNOSIS — K644 Residual hemorrhoidal skin tags: Secondary | ICD-10-CM | POA: Insufficient documentation

## 2018-04-14 DIAGNOSIS — K9289 Other specified diseases of the digestive system: Secondary | ICD-10-CM | POA: Insufficient documentation

## 2018-04-14 DIAGNOSIS — K580 Irritable bowel syndrome with diarrhea: Secondary | ICD-10-CM | POA: Diagnosis not present

## 2018-04-14 HISTORY — PX: MALONEY DILATION: SHX5535

## 2018-04-14 HISTORY — PX: COLONOSCOPY WITH PROPOFOL: SHX5780

## 2018-04-14 HISTORY — PX: BIOPSY: SHX5522

## 2018-04-14 HISTORY — PX: ESOPHAGOGASTRODUODENOSCOPY (EGD) WITH PROPOFOL: SHX5813

## 2018-04-14 HISTORY — PX: POLYPECTOMY: SHX5525

## 2018-04-14 SURGERY — COLONOSCOPY WITH PROPOFOL
Anesthesia: Monitor Anesthesia Care

## 2018-04-14 MED ORDER — PROPOFOL 500 MG/50ML IV EMUL
INTRAVENOUS | Status: DC | PRN
  Administered 2018-04-14 (×2): 150 ug/kg/min via INTRAVENOUS
  Administered 2018-04-14: 75 ug/kg/min via INTRAVENOUS

## 2018-04-14 MED ORDER — PROPOFOL 10 MG/ML IV BOLUS
INTRAVENOUS | Status: DC | PRN
  Administered 2018-04-14 (×2): 20 mg via INTRAVENOUS
  Administered 2018-04-14: 40 mg via INTRAVENOUS
  Administered 2018-04-14: 60 mg via INTRAVENOUS
  Administered 2018-04-14: 40 mg via INTRAVENOUS
  Administered 2018-04-14: 20 mg via INTRAVENOUS

## 2018-04-14 MED ORDER — CHLORHEXIDINE GLUCONATE CLOTH 2 % EX PADS: 6.0000 | MEDICATED_PAD | Freq: Once | CUTANEOUS | Status: DC

## 2018-04-14 MED ORDER — LACTATED RINGERS IV SOLN
INTRAVENOUS | Status: DC
  Administered 2018-04-14: 08:00:00 via INTRAVENOUS

## 2018-04-14 NOTE — Transfer of Care (Signed)
Immediate Anesthesia Transfer of Care Note  Patient: [place]  Procedure(s) Performed: COLONOSCOPY WITH PROPOFOL (N/A ) ESOPHAGOGASTRODUODENOSCOPY (EGD) WITH PROPOFOL (N/A ) MALONEY DILATION (N/A ) BIOPSY POLYPECTOMY  Patient Location: PACU  Anesthesia Type:MAC  Level of Consciousness: awake, alert  and patient cooperative  Airway & Oxygen Therapy: Patient Spontanous Breathing  Post-op Assessment: Report given to RN and Post -op Vital signs reviewed and stable  Post vital signs: Reviewed and stable  Last Vitals:  Vitals Value Taken Time  BP    Temp    Pulse    Resp    SpO2      Last Pain:  Vitals:   04/14/18 0756  TempSrc: Oral  PainSc: 0-No pain      Patients Stated Pain Goal: 5 (99/06/89 3406)  Complications: No apparent anesthesia complications

## 2018-04-14 NOTE — Op Note (Signed)
[facility] [place] Patient Name: [name] Procedure Date: 04/14/2018 8:55 AM MRN: [id number] Date of Birth: Jul 05, 1955 Attending MD: Norvel Richards , MD CSN: [id number] Age: 63 Admit Type: Outpatient Procedure:                Colonoscopy Indications:              High risk colon cancer surveillance: Personal                            history of colonic polyps Providers:                Norvel Richards, MD, Janeece Riggers, RN, Randa Spike, Technician Referring MD:              Medicines:                Propofol per Anesthesia Complications:            No immediate complications. Estimated Blood Loss:     Estimated blood loss was minimal. Procedure:                Pre-Anesthesia Assessment:                           - Prior to the procedure, a History and Physical                            was performed, and patient medications and                            allergies were reviewed. The patient's tolerance of                            previous anesthesia was also reviewed. The risks                            and benefits of the procedure and the sedation                            options and risks were discussed with the patient.                            All questions were answered, and informed consent                            was obtained. Prior Anticoagulants: The patient has                            taken no previous anticoagulant or antiplatelet                            agents. ASA Grade Assessment: II - A patient with  mild systemic disease. After reviewing the risks                            and benefits, the patient was deemed in                            satisfactory condition to undergo the procedure.                           After obtaining informed consent, the colonoscope                            was passed under direct vision. Throughout the                            procedure, the  patient's blood pressure, pulse, and                            oxygen saturations were monitored continuously. The                            [id number]-HQ190L ([id number]) scope was introduced through                            the and advanced to the 10 cm into the ileum. The                            colonoscopy was performed without difficulty. The                            patient tolerated the procedure well. The quality                            of the bowel preparation was adequate. The terminal                            ileum, ileocecal valve, appendiceal orifice, and                            rectum were photographed. The entire colon was well                            visualized. Scope In: 8:59:23 AM Scope Out: 9:15:56 AM Scope Withdrawal Time: 0 hours 12 minutes 4 seconds  Total Procedure Duration: 0 hours 16 minutes 33 seconds  Findings:      Hemorrhoids were found on perianal exam.      External and internal hemorrhoids were found during retroflexion. The       hemorrhoids were moderate, medium-sized and Grade III (internal       hemorrhoids that prolapse but require manual reduction).      Five sessile polyps were found in the rectum, splenic flexure and       ascending colon. The polyps were 4 to 5 mm in size. These polyps were  removed with a cold snare. Resection and retrieval were complete.       Estimated blood loss was minimal. Segmental biopsies biopsies of the       right and left colon taken to evaluate for microscopic colitis. The       distal 10 cm of TI was inspected and appeared normal. Impression:               - Hemorrhoids found on perianal exam.                           - External and internal hemorrhoids.                           - Five 4 to 5 mm polyps in the rectum, at the                            splenic flexure and in the ascending colon, removed                            with a cold snare. Resected and retrieved. Status                             post segmental biopsy. Moderate Sedation:      Moderate (conscious) sedation was personally administered by an       anesthesia professional. The following parameters were monitored: oxygen       saturation, heart rate, blood pressure, respiratory rate, EKG, adequacy       of pulmonary ventilation, and response to care. Recommendation:           - Patient has a contact number available for                            emergencies. The signs and symptoms of potential                            delayed complications were discussed with the                            patient. Return to normal activities tomorrow.                            Written discharge instructions were provided to the                            patient.                           - Advance diet as tolerated.                           - Continue present medications.                           - Repeat colonoscopy date to be determined after  pending pathology results are reviewed for                            surveillance based on pathology results.                           - Return to GI office in 2 months. See EGD report. Procedure Code(s):        --- Professional ---                           [phone], Colonoscopy, flexible; with removal of                            tumor(s), polyp(s), or other lesion(s) by snare                            technique Diagnosis Code(s):        --- Professional ---                           Z86.010, Personal history of colonic polyps                           K64.2, Third degree hemorrhoids                           K62.1, Rectal polyp                           D12.3, Benign neoplasm of transverse colon (hepatic                            flexure or splenic flexure)                           D12.2, Benign neoplasm of ascending colon CPT copyright 2018 American Medical Association. All rights reserved. The codes documented in this report are preliminary and  upon coder review may  be revised to meet current compliance requirements. Cristopher Estimable. Tyrell Seifer, MD Norvel Richards, MD 04/14/2018 9:21:52 AM This report has been signed electronically. Number of Addenda: 0

## 2018-04-14 NOTE — Anesthesia Postprocedure Evaluation (Signed)
Anesthesia Post Note  Patient: [name]  Procedure(s) Performed: COLONOSCOPY WITH PROPOFOL (N/A ) ESOPHAGOGASTRODUODENOSCOPY (EGD) WITH PROPOFOL (N/A ) MALONEY DILATION (N/A ) BIOPSY POLYPECTOMY  Patient location during evaluation: PACU Anesthesia Type: MAC Level of consciousness: awake and alert and patient cooperative Pain management: satisfactory to patient Vital Signs Assessment: post-procedure vital signs reviewed and stable Respiratory status: spontaneous breathing Cardiovascular status: stable Postop Assessment: no apparent nausea or vomiting Anesthetic complications: no     Last Vitals:  Vitals:   04/14/18 0756 04/14/18 0922  BP: (!) 195/85 127/60  Pulse: 80 64  Resp: 18 14  Temp: 37.1 C 36.5 C  SpO2: 94% 98%    Last Pain:  Vitals:   04/14/18 0922  TempSrc:   PainSc: 0-No pain                 Sahira Cataldi

## 2018-04-14 NOTE — Anesthesia Procedure Notes (Signed)
Procedure Name: MAC Date/Time: 04/14/2018 8:37 AM Performed by: Vista Deck, CRNA Pre-anesthesia Checklist: Patient identified, Emergency Drugs available, Suction available, Timeout performed and Patient being monitored Patient Re-evaluated:Patient Re-evaluated prior to induction Oxygen Delivery Method: Nasal Cannula

## 2018-04-14 NOTE — Op Note (Signed)
[facility] Patient Name: [name] Procedure Date: 04/14/2018 8:28 AM MRN: [id number] Date of Birth: 1955-04-25 Attending MD: Norvel Richards , MD CSN: [id number] Age: 63 Admit Type: Outpatient Procedure:                Upper GI endoscopy Indications:              Dysphagia Providers:                Norvel Richards, MD, Janeece Riggers, RN, Randa Spike, Technician Referring MD:             Edwinna Areola. Hall MD Medicines:                Propofol per Anesthesia Complications:            No immediate complications. Estimated Blood Loss:     Estimated blood loss: none. Procedure:                Pre-Anesthesia Assessment:                           - Prior to the procedure, a History and Physical                            was performed, and patient medications and                            allergies were reviewed. The patient's tolerance of                            previous anesthesia was also reviewed. The risks                            and benefits of the procedure and the sedation                            options and risks were discussed with the patient.                            All questions were answered, and informed consent                            was obtained. Prior Anticoagulants: The patient has                            taken no previous anticoagulant or antiplatelet                            agents. ASA Grade Assessment: II - A patient with                            mild systemic disease. After reviewing the risks  and benefits, the patient was deemed in                            satisfactory condition to undergo the procedure.                           After obtaining informed consent, the endoscope was                            passed under direct vision. Throughout the                            procedure, the patient's blood pressure, pulse, and                            oxygen  saturations were monitored continuously. The                            [id number]-H190 ([id number]) was introduced through the and                            advanced to the third part of duodenum. The upper                            GI endoscopy was accomplished without difficulty.                            The patient tolerated the procedure well. Scope In: 8:45:07 AM Scope Out: 8:53:16 AM Total Procedure Duration: 0 hours 8 minutes 9 seconds  Findings:      The examined esophagus was normal aside from a small inlet patch. The       scope was withdrawn. Dilation was performed with a Maloney dilator with       mild resistance at 65 Fr. The scope was withdrawn. Dilation was       performed with a Maloney dilator with mild resistance at 56 Fr. The       dilation site was examined following endoscope reinsertion and showed no       change. Estimated blood loss: none.      The entire examined stomach was normal.      The duodenal bulb, second portion of the duodenum and third portion of       the duodenum were normal. Impression:               - Normal esophagus. Dilated.                           - Normal stomach.                           - Normal duodenal bulb, second portion of the                            duodenum and third portion of the duodenum.                           -  No specimens collected. Moderate Sedation:      Moderate (conscious) sedation was personally administered by an       anesthesia professional. The following parameters were monitored: oxygen       saturation, heart rate, blood pressure, respiratory rate, EKG, adequacy       of pulmonary ventilation, and response to care. Recommendation:           - Patient has a contact number available for                            emergencies. The signs and symptoms of potential                            delayed complications were discussed with the                            patient. Return to normal activities tomorrow.                             Written discharge instructions were provided to the                            patient.                           - Resume previous diet.                           - Continue present medications.                           - No repeat upper endoscopy.                           - Return to GI office in 2 months. See colonoscopy                            report. Procedure Code(s):        --- Professional ---                           [phone], Esophagogastroduodenoscopy, flexible,                            transoral; diagnostic, including collection of                            specimen(s) by brushing or washing, when performed                            (separate procedure)                           43450, Dilation of esophagus, by unguided sound or                            bougie, single or multiple passes Diagnosis  Code(s):        --- Professional ---                           R13.10, Dysphagia, unspecified CPT copyright 2018 American Medical Association. All rights reserved. The codes documented in this report are preliminary and upon coder review may  be revised to meet current compliance requirements. Cristopher Estimable. Rourk, MD Norvel Richards, MD 04/14/2018 9:17:09 AM This report has been signed electronically. Number of Addenda: 0

## 2018-04-14 NOTE — Anesthesia Preprocedure Evaluation (Signed)
Anesthesia Evaluation  Patient identified by MRN, date of birth, ID band Patient awake    Reviewed: Allergy & Precautions, H&P , NPO status , Patient's Chart, lab work & pertinent test results  Airway Mallampati: II  TM Distance: >3 FB Neck ROM: full    Dental  (+) Edentulous Upper, Edentulous Lower   Pulmonary COPD, Current Smoker,    Pulmonary exam normal breath sounds clear to auscultation       Cardiovascular Exercise Tolerance: Good hypertension, + Peripheral Vascular Disease   Rhythm:regular Rate:Normal     Neuro/Psych  Headaches, TIA Neuromuscular disease negative psych ROS   GI/Hepatic Neg liver ROS, GERD  ,  Endo/Other  negative endocrine ROS  Renal/GU negative Renal ROS  negative genitourinary   Musculoskeletal   Abdominal   Peds  Hematology negative hematology ROS (+)   Anesthesia Other Findings   Reproductive/Obstetrics negative OB ROS                             Anesthesia Physical Anesthesia Plan  ASA: III  Anesthesia Plan: MAC   Post-op Pain Management:    Induction:   PONV Risk Score and Plan:   Airway Management Planned:   Additional Equipment:   Intra-op Plan:   Post-operative Plan:   Informed Consent: I have reviewed the patients History and Physical, chart, labs and discussed the procedure including the risks, benefits and alternatives for the proposed anesthesia with the patient or authorized representative who has indicated his/her understanding and acceptance.     Plan Discussed with: CRNA  Anesthesia Plan Comments:         Anesthesia Quick Evaluation
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
## 2018-04-14 NOTE — Discharge Instructions (Signed)
°Colonoscopy °Discharge Instructions ° °Read the instructions outlined below and refer to this sheet in the next few weeks. These discharge instructions provide you with general information on caring for yourself after you leave the hospital. Your doctor may also give you specific instructions. While your treatment has been planned according to the most current medical practices available, unavoidable complications occasionally occur. If you have any problems or questions after discharge, call Dr. Rourk at [phone]. °ACTIVITY °· You may resume your regular activity, but move at a slower pace for the next 24 hours.  °· Take frequent rest periods for the next 24 hours.  °· Walking will help get rid of the air and reduce the bloated feeling in your belly (abdomen).  °· No driving for 24 hours (because of the medicine (anesthesia) used during the test).   °· Do not sign any important legal documents or operate any machinery for 24 hours (because of the anesthesia used during the test).  °NUTRITION °· Drink plenty of fluids.  °· You may resume your normal diet as instructed by your doctor.  °· Begin with a light meal and progress to your normal diet. Heavy or fried foods are harder to digest and may make you feel sick to your stomach (nauseated).  °· Avoid alcoholic beverages for 24 hours or as instructed.  °MEDICATIONS °· You may resume your normal medications unless your doctor tells you otherwise.  °WHAT YOU CAN EXPECT TODAY °· Some feelings of bloating in the abdomen.  °· Passage of more gas than usual.  °· Spotting of blood in your stool or on the toilet paper.  °IF YOU HAD POLYPS REMOVED DURING THE COLONOSCOPY: °· No aspirin products for 7 days or as instructed.  °· No alcohol for 7 days or as instructed.  °· Eat a soft diet for the next 24 hours.  °FINDING OUT THE RESULTS OF YOUR TEST °Not all test results are available during your visit. If your test results are not back during the visit, make an appointment  with your caregiver to find out the results. Do not assume everything is normal if you have not heard from your caregiver or the medical facility. It is important for you to follow up on all of your test results.  °SEEK IMMEDIATE MEDICAL ATTENTION IF: °· You have more than a spotting of blood in your stool.  °· Your belly is swollen (abdominal distention).  °· You are nauseated or vomiting.  °· You have a temperature over 101.  °· You have abdominal pain or discomfort that is severe or gets worse throughout the day.  °EGD °Discharge instructions °Please read the instructions outlined below and refer to this sheet in the next few weeks. These discharge instructions provide you with general information on caring for yourself after you leave the hospital. Your doctor may also give you specific instructions. While your treatment has been planned according to the most current medical practices available, unavoidable complications occasionally occur. If you have any problems or questions after discharge, please call your doctor. °ACTIVITY °· You may resume your regular activity but move at a slower pace for the next 24 hours.  °· Take frequent rest periods for the next 24 hours.  °· Walking will help expel (get rid of) the air and reduce the bloated feeling in your abdomen.  °· No driving for 24 hours (because of the anesthesia (medicine) used during the test).  °· You may shower.  °· Do not sign any important   legal documents or operate any machinery for 24 hours (because of the anesthesia used during the test).  NUTRITION  Drink plenty of fluids.   You may resume your normal diet.   Begin with a light meal and progress to your normal diet.   Avoid alcoholic beverages for 24 hours or as instructed by your caregiver.  MEDICATIONS  You may resume your normal medications unless your caregiver tells you otherwise.  WHAT YOU CAN EXPECT TODAY  You may experience abdominal discomfort such as a feeling of fullness  or gas pains.  FOLLOW-UP  Your doctor will discuss the results of your test with you.  SEEK IMMEDIATE MEDICAL ATTENTION IF ANY OF THE FOLLOWING OCCUR:  Excessive nausea (feeling sick to your stomach) and/or vomiting.   Severe abdominal pain and distention (swelling).   Trouble swallowing.   Temperature over 101 F (37.8 C).   Rectal bleeding or vomiting of blood.    Polyp diverticulosis and hemorrhoid information provided  Further recommendations to follow pending review of pathology report  Office visit with [place] in 2 months    Colon Polyps  Polyps are tissue growths inside the body. Polyps can grow in many places, including the large intestine (colon). A polyp may be a round bump or a mushroom-shaped growth. You could have one polyp or several. Most colon polyps are noncancerous (benign). However, some colon polyps can become cancerous over time. Finding and removing the polyps early can help prevent this. What are the causes? The exact cause of colon polyps is not known. What increases the risk? You are more likely to develop this condition if you:  Have a family history of colon cancer or colon polyps.  Are older than 76 or older than 45 if you are African American.  Have inflammatory bowel disease, such as ulcerative colitis or Crohn's disease.  Have certain hereditary conditions, such as: ? Familial adenomatous polyposis. ? Lynch syndrome. ? Turcot syndrome. ? Peutz-Jeghers syndrome.  Are overweight.  Smoke cigarettes.  Do not get enough exercise.  Drink too much alcohol.  Eat a diet that is high in fat and red meat and low in fiber.  Had childhood cancer that was treated with abdominal radiation. What are the signs or symptoms? Most polyps do not cause symptoms. If you have symptoms, they may include:  Blood coming from your rectum when having a bowel movement.  Blood in your stool. The stool may look dark red or black.  Abdominal pain.  A  change in bowel habits, such as constipation or diarrhea. How is this diagnosed? This condition is diagnosed with a colonoscopy. This is a procedure in which a lighted, flexible scope is inserted into the anus and then passed into the colon to examine the area. Polyps are sometimes found when a colonoscopy is done as part of routine cancer screening tests. How is this treated? Treatment for this condition involves removing any polyps that are found. Most polyps can be removed during a colonoscopy. Those polyps will then be tested for cancer. Additional treatment may be needed depending on the results of testing. Follow these instructions at home: Lifestyle  Maintain a healthy weight, or lose weight if recommended by your health care provider.  Exercise every day or as told by your health care provider.  Do not use any products that contain nicotine or tobacco, such as cigarettes and e-cigarettes. If you need help quitting, ask your health care provider.  If you drink alcohol, limit how much you  have: ? 0-1 drink a day for women. ? 0-2 drinks a day for men.  Be aware of how much alcohol is in your drink. In the [place] one drink equals one 12 oz bottle of beer (355 mL), one 5 oz glass of wine (148 mL), or one 1 oz shot of hard liquor (44 mL). Eating and drinking   Eat foods that are high in fiber, such as fruits, vegetables, and whole grains.  Eat foods that are high in calcium and vitamin D, such as milk, cheese, yogurt, eggs, liver, fish, and broccoli.  Limit foods that are high in fat, such as fried foods and desserts.  Limit the amount of red meat and processed meat you eat, such as hot dogs, sausage, bacon, and lunch meats. General instructions  Keep all follow-up visits as told by your health care provider. This is important. ? This includes having regularly scheduled colonoscopies. ? Talk to your health care provider about when you need a colonoscopy. Contact a health care  provider if:  You have new or worsening bleeding during a bowel movement.  You have new or increased blood in your stool.  You have a change in bowel habits.  You lose weight for no known reason. Summary  Polyps are tissue growths inside the body. Polyps can grow in many places, including the colon.  Most colon polyps are noncancerous (benign), but some can become cancerous over time.  This condition is diagnosed with a colonoscopy.  Treatment for this condition involves removing any polyps that are found. Most polyps can be removed during a colonoscopy. This information is not intended to replace advice given to you by your health care provider. Make sure you discuss any questions you have with your health care provider. Document Released: 12/25/2003 Document Revised: 07/15/2017 Document Reviewed: 07/15/2017 Elsevier Interactive Patient Education  2019 [employer]   Hemorrhoids Hemorrhoids are swollen veins in and around the rectum or anus. There are two types of hemorrhoids:  Internal hemorrhoids. These occur in the veins that are just inside the rectum. They may poke through to the outside and become irritated and painful.  External hemorrhoids. These occur in the veins that are outside the anus and can be felt as a painful swelling or hard lump near the anus. Most hemorrhoids do not cause serious problems, and they can be managed with home treatments such as diet and lifestyle changes. If home treatments do not help the symptoms, procedures can be done to shrink or remove the hemorrhoids. What are the causes? This condition is caused by increased pressure in the anal area. This pressure may result from various things, including:  Constipation.  Straining to have a bowel movement.  Diarrhea.  Pregnancy.  Obesity.  Sitting for long periods of time.  Heavy lifting or other activity that causes you to strain.  Anal sex.  Riding a bike for a long period of  time. What are the signs or symptoms? Symptoms of this condition include:  Pain.  Anal itching or irritation.  Rectal bleeding.  Leakage of stool (feces).  Anal swelling.  One or more lumps around the anus. How is this diagnosed? This condition can often be diagnosed through a visual exam. Other exams or tests may also be done, such as:  An exam that involves feeling the rectal area with a gloved hand (digital rectal exam).  An exam of the anal canal that is done using a small tube (anoscope).  A blood test, if you  have lost a significant amount of blood.  A test to look inside the colon using a flexible tube with a camera on the end (sigmoidoscopy or colonoscopy). How is this treated? This condition can usually be treated at home. However, various procedures may be done if dietary changes, lifestyle changes, and other home treatments do not help your symptoms. These procedures can help make the hemorrhoids smaller or remove them completely. Some of these procedures involve surgery, and others do not. Common procedures include:  Rubber band ligation. Rubber bands are placed at the base of the hemorrhoids to cut off their blood supply.  Sclerotherapy. Medicine is injected into the hemorrhoids to shrink them.  Infrared coagulation. A type of light energy is used to get rid of the hemorrhoids.  Hemorrhoidectomy surgery. The hemorrhoids are surgically removed, and the veins that supply them are tied off.  Stapled hemorrhoidopexy surgery. The surgeon staples the base of the hemorrhoid to the rectal wall. Follow these instructions at home: Eating and drinking   Eat foods that have a lot of fiber in them, such as whole grains, beans, nuts, fruits, and vegetables.  Ask your health care provider about taking products that have added fiber (fiber supplements).  Reduce the amount of fat in your diet. You can do this by eating low-fat dairy products, eating less red meat, and  avoiding processed foods.  Drink enough fluid to keep your urine pale yellow. Managing pain and swelling   Take warm sitz baths for 20 minutes, 3-4 times a day to ease pain and discomfort. You may do this in a bathtub or using a portable sitz bath that fits over the toilet.  If directed, apply ice to the affected area. Using ice packs between sitz baths may be helpful. ? Put ice in a plastic bag. ? Place a towel between your skin and the bag. ? Leave the ice on for 20 minutes, 2-3 times a day. General instructions  Take over-the-counter and prescription medicines only as told by your health care provider.  Use medicated creams or suppositories as told.  Get regular exercise. Ask your health care provider how much and what kind of exercise is best for you. In general, you should do moderate exercise for at least 30 minutes on most days of the week (150 minutes each week). This can include activities such as walking, biking, or yoga.  Go to the bathroom when you have the urge to have a bowel movement. Do not wait.  Avoid straining to have bowel movements.  Keep the anal area dry and clean. Use wet toilet paper or moist towelettes after a bowel movement.  Do not sit on the toilet for long periods of time. This increases blood pooling and pain.  Keep all follow-up visits as told by your health care provider. This is important. Contact a health care provider if you have:  Increasing pain and swelling that are not controlled by treatment or medicine.  Difficulty having a bowel movement, or you are unable to have a bowel movement.  Pain or inflammation outside the area of the hemorrhoids. Get help right away if you have:  Uncontrolled bleeding from your rectum. Summary  Hemorrhoids are swollen veins in and around the rectum or anus.  Most hemorrhoids can be managed with home treatments such as diet and lifestyle changes.  Taking warm sitz baths can help ease pain and  discomfort.  In severe cases, procedures or surgery can be done to shrink or remove the  hemorrhoids. This information is not intended to replace advice given to you by your health care provider. Make sure you discuss any questions you have with your health care provider. Document Released: 03/27/2000 Document Revised: 08/19/2017 Document Reviewed: 08/19/2017 Elsevier Interactive Patient Education  2019 [place] Anesthesia is a term that refers to techniques, procedures, and medicines that help a person stay safe and comfortable during a medical procedure. Monitored anesthesia care, or sedation, is one type of anesthesia. Your anesthesia specialist may recommend sedation if you will be having a procedure that does not require you to be unconscious, such as:  Cataract surgery.  A dental procedure.  A biopsy.  A colonoscopy. During the procedure, you may receive a medicine to help you relax (sedative). There are three levels of sedation:  Mild sedation. At this level, you may feel awake and relaxed. You will be able to follow directions.  Moderate sedation. At this level, you will be sleepy. You may not remember the procedure.  Deep sedation. At this level, you will be asleep. You will not remember the procedure. The more medicine you are given, the deeper your level of sedation will be. Depending on how you respond to the procedure, the anesthesia specialist may change your level of sedation or the type of anesthesia to fit your needs. An anesthesia specialist will monitor you closely during the procedure. Let your health care provider know about:  Any allergies you have.  All medicines you are taking, including vitamins, herbs, eye drops, creams, and over-the-counter medicines.  Any use of steroids (by mouth or as a cream).  Any problems you or family members have had with sedatives and anesthetic medicines.  Any blood disorders you have.  Any  surgeries you have had.  Any medical conditions you have, such as sleep apnea.  Whether you are pregnant or may be pregnant.  Any use of cigarettes, alcohol, or street drugs. What are the risks? Generally, this is a safe procedure. However, problems may occur, including:  Getting too much medicine (oversedation).  Nausea.  Allergic reaction to medicines.  Trouble breathing. If this happens, a breathing tube may be used to help with breathing. It will be removed when you are awake and breathing on your own.  Heart trouble.  Lung trouble. Before the procedure Staying hydrated Follow instructions from your health care provider about hydration, which may include:  Up to 2 hours before the procedure - you may continue to drink clear liquids, such as water, clear fruit juice, black coffee, and plain tea. Eating and drinking restrictions Follow instructions from your health care provider about eating and drinking, which may include:  8 hours before the procedure - stop eating heavy meals or foods such as meat, fried foods, or fatty foods.  6 hours before the procedure - stop eating light meals or foods, such as toast or cereal.  6 hours before the procedure - stop drinking milk or drinks that contain milk.  2 hours before the procedure - stop drinking clear liquids. Medicines Ask your health care provider about:  Changing or stopping your regular medicines. This is especially important if you are taking diabetes medicines or blood thinners.  Taking medicines such as aspirin and ibuprofen. These medicines can thin your blood. Do not take these medicines before your procedure if your health care provider instructs you not to. Tests and exams  You will have a physical exam.  You may have blood tests done to show: ?  How well your kidneys and liver are working. ? How well your blood can clot. General instructions  Plan to have someone take you home from the hospital or  clinic.  If you will be going home right after the procedure, plan to have someone with you for 24 hours.  What happens during the procedure?  Your blood pressure, heart rate, breathing, level of pain and overall condition will be monitored.  An IV tube will be inserted into one of your veins.  Your anesthesia specialist will give you medicines as needed to keep you comfortable during the procedure. This may mean changing the level of sedation.  The procedure will be performed. After the procedure  Your blood pressure, heart rate, breathing rate, and blood oxygen level will be monitored until the medicines you were given have worn off.  Do not drive for 24 hours if you received a sedative.  You may: ? Feel sleepy, clumsy, or nauseous. ? Feel forgetful about what happened after the procedure. ? Have a sore throat if you had a breathing tube during the procedure. ? Vomit. This information is not intended to replace advice given to you by your health care provider. Make sure you discuss any questions you have with your health care provider. Document Released: 12/24/2004 Document Revised: 09/06/2015 Document Reviewed: 07/21/2015 Elsevier Interactive Patient Education  2019 [employer].  Diverticulosis  Diverticulosis is a condition that develops when small pouches (diverticula) form in the wall of the large intestine (colon). The colon is where water is absorbed and stool is formed. The pouches form when the inside layer of the colon pushes through weak spots in the outer layers of the colon. You may have a few pouches or many of them. What are the causes? The cause of this condition is not known. What increases the risk? The following factors may make you more likely to develop this condition:  Being older than age 45. Your risk for this condition increases with age. Diverticulosis is rare among people younger than age 59. By age 40, many people have it.  Eating a low-fiber  diet.  Having frequent constipation.  Being overweight.  Not getting enough exercise.  Smoking.  Taking over-the-counter pain medicines, like aspirin and ibuprofen.  Having a family history of diverticulosis. What are the signs or symptoms? In most people, there are no symptoms of this condition. If you do have symptoms, they may include:  Bloating.  Cramps in the abdomen.  Constipation or diarrhea.  Pain in the lower left side of the abdomen. How is this diagnosed? This condition is most often diagnosed during an exam for other colon problems. Because diverticulosis usually has no symptoms, it often cannot be diagnosed independently. This condition may be diagnosed by:  Using a flexible scope to examine the colon (colonoscopy).  Taking an X-ray of the colon after dye has been put into the colon (barium enema).  Doing a CT scan. How is this treated? You may not need treatment for this condition if you have never developed an infection related to diverticulosis. If you have had an infection before, treatment may include:  Eating a high-fiber diet. This may include eating more fruits, vegetables, and grains.  Taking a fiber supplement.  Taking a live bacteria supplement (probiotic).  Taking medicine to relax your colon.  Taking antibiotic medicines. Follow these instructions at home:  Drink 6-8 glasses of water or more each day to prevent constipation.  Try not to strain when you have  a bowel movement.  If you have had an infection before: ? Eat more fiber as directed by your health care provider or your diet and nutrition specialist (dietitian). ? Take a fiber supplement or probiotic, if your health care provider approves.  Take over-the-counter and prescription medicines only as told by your health care provider.  If you were prescribed an antibiotic, take it as told by your health care provider. Do not stop taking the antibiotic even if you start to feel  better.  Keep all follow-up visits as told by your health care provider. This is important. Contact a health care provider if:  You have pain in your abdomen.  You have bloating.  You have cramps.  You have not had a bowel movement in 3 days. Get help right away if:  Your pain gets worse.  Your bloating becomes very bad.  You have a fever or chills, and your symptoms suddenly get worse.  You vomit.  You have bowel movements that are bloody or black.  You have bleeding from your rectum. Summary  Diverticulosis is a condition that develops when small pouches (diverticula) form in the wall of the large intestine (colon).  You may have a few pouches or many of them.  This condition is most often diagnosed during an exam for other colon problems.  If you have had an infection related to diverticulosis, treatment may include increasing the fiber in your diet, taking supplements, or taking medicines. This information is not intended to replace advice given to you by your health care provider. Make sure you discuss any questions you have with your health care provider. Document Released: 12/26/2003 Document Revised: 02/17/2016 Document Reviewed: 02/17/2016 Elsevier Interactive Patient Education  2019 [employer].

## 2018-04-16 ENCOUNTER — Encounter: Payer: Self-pay | Admitting: Internal Medicine

## 2018-04-19 ENCOUNTER — Telehealth: Payer: Self-pay

## 2018-04-19 ENCOUNTER — Encounter: Payer: Self-pay | Admitting: Internal Medicine

## 2018-04-19 NOTE — Telephone Encounter (Signed)
Per [facility]- Send letter to patient.  Send copy of letter with path to referring provider and PCP.   Should have an appt w extender re: F/u diarrhea.

## 2018-04-19 NOTE — Telephone Encounter (Signed)
APPT MADE AND MAILED LETTER

## 2018-04-20 ENCOUNTER — Encounter (HOSPITAL_COMMUNITY): Payer: Self-pay | Admitting: Internal Medicine
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
## 2018-05-12 ENCOUNTER — Ambulatory Visit: Payer: Medicare Other | Admitting: Student

## 2018-05-24 ENCOUNTER — Encounter: Payer: Self-pay | Admitting: Cardiology

## 2018-05-24 ENCOUNTER — Ambulatory Visit (INDEPENDENT_AMBULATORY_CARE_PROVIDER_SITE_OTHER): Payer: Medicare HMO | Admitting: Cardiology

## 2018-05-24 VITALS — BP 144/78 | HR 85 | Ht 61.0 in | Wt 140.0 lb

## 2018-05-24 DIAGNOSIS — E559 Vitamin D deficiency, unspecified: Secondary | ICD-10-CM | POA: Diagnosis not present

## 2018-05-24 DIAGNOSIS — R011 Cardiac murmur, unspecified: Secondary | ICD-10-CM

## 2018-05-24 DIAGNOSIS — I6523 Occlusion and stenosis of bilateral carotid arteries: Secondary | ICD-10-CM

## 2018-05-24 DIAGNOSIS — Z720 Tobacco use: Secondary | ICD-10-CM | POA: Diagnosis not present

## 2018-05-24 DIAGNOSIS — I10 Essential (primary) hypertension: Secondary | ICD-10-CM

## 2018-05-24 DIAGNOSIS — E782 Mixed hyperlipidemia: Secondary | ICD-10-CM | POA: Diagnosis not present

## 2018-05-24 DIAGNOSIS — R7301 Impaired fasting glucose: Secondary | ICD-10-CM | POA: Diagnosis not present

## 2018-05-24 DIAGNOSIS — E876 Hypokalemia: Secondary | ICD-10-CM | POA: Diagnosis not present

## 2018-05-24 MED ORDER — SPIRONOLACTONE 25 MG PO TABS: 25.0000 mg | ORAL_TABLET | Freq: Every day | ORAL | 3 refills | Status: AC
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
## 2018-05-24 NOTE — Patient Instructions (Signed)
Medication Instructions:  START Aldactone 25 mg daily  If you need a refill on your cardiac medications before your next appointment, please call your pharmacy.   Lab work: In 2 weeks: BMET lab work If you have labs (blood work) drawn today and your tests are completely normal, you will receive your results only by: Marland Kitchen MyChart Message (if you have MyChart) OR . A paper copy in the mail If you have any lab test that is abnormal or we need to change your treatment, we will call you to review the results.  Testing/Procedures: None today  Follow-Up: At [facility], you and your health needs are our priority.  As part of our continuing mission to provide you with exceptional heart care, we have created designated Provider Care Teams.  These Care Teams include your primary Cardiologist (physician) and Advanced Practice Providers (APPs -  Physician Assistants and Nurse Practitioners) who all work together to provide you with the care you need, when you need it. You will need a follow up appointment in 3 months.  Please call our office 2 months in advance to schedule this appointment.  You may see Rozann Lesches, MD or one of the following Advanced Practice Providers on your designated Care Team:   Bernerd Pho, PA-C [facility]) . Ermalinda Barrios, PA-C ([place])  Any Other Special Instructions Will Be Listed Below (If Applicable). None

## 2018-05-25 DIAGNOSIS — I10 Essential (primary) hypertension: Secondary | ICD-10-CM | POA: Diagnosis not present

## 2018-05-25 DIAGNOSIS — Z23 Encounter for immunization: Secondary | ICD-10-CM | POA: Diagnosis not present

## 2018-05-25 DIAGNOSIS — R05 Cough: Secondary | ICD-10-CM | POA: Diagnosis not present

## 2018-05-25 DIAGNOSIS — E559 Vitamin D deficiency, unspecified: Secondary | ICD-10-CM | POA: Diagnosis not present

## 2018-05-25 DIAGNOSIS — Z0000 Encounter for general adult medical examination without abnormal findings: Secondary | ICD-10-CM | POA: Diagnosis not present

## 2018-05-25 DIAGNOSIS — R109 Unspecified abdominal pain: Secondary | ICD-10-CM | POA: Diagnosis not present

## 2018-05-25 DIAGNOSIS — R7301 Impaired fasting glucose: Secondary | ICD-10-CM | POA: Diagnosis not present

## 2018-05-25 DIAGNOSIS — E663 Overweight: Secondary | ICD-10-CM | POA: Diagnosis not present

## 2018-05-25 DIAGNOSIS — E782 Mixed hyperlipidemia: Secondary | ICD-10-CM | POA: Diagnosis not present

## 2018-05-26 ENCOUNTER — Other Ambulatory Visit: Payer: Self-pay | Admitting: Adult Health Nurse Practitioner

## 2018-05-26 DIAGNOSIS — R109 Unspecified abdominal pain: Secondary | ICD-10-CM

## 2018-05-30 DIAGNOSIS — R220 Localized swelling, mass and lump, head: Secondary | ICD-10-CM | POA: Diagnosis not present

## 2018-05-31 ENCOUNTER — Ambulatory Visit (HOSPITAL_COMMUNITY)
Admission: RE | Admit: 2018-05-31 | Discharge: 2018-05-31 | Disposition: A | Discharge status: Home / Self Care | Payer: Medicare HMO | Source: Ambulatory Visit | Attending: Adult Health Nurse Practitioner | Admitting: Adult Health Nurse Practitioner

## 2018-05-31 ENCOUNTER — Telehealth: Payer: Self-pay | Admitting: Cardiology

## 2018-05-31 DIAGNOSIS — K760 Fatty (change of) liver, not elsewhere classified: Secondary | ICD-10-CM | POA: Diagnosis not present

## 2018-05-31 DIAGNOSIS — R109 Unspecified abdominal pain: Secondary | ICD-10-CM | POA: Diagnosis not present

## 2018-05-31 MED ORDER — VERAPAMIL HCL ER 120 MG PO TBCR: EXTENDED_RELEASE_TABLET | ORAL | 3 refills | Status: DC

## 2018-05-31 NOTE — Telephone Encounter (Signed)
Pt returned call. She stated that since starting losartan her face, eyes, lips started swelling. She thought it was sinuses, but when she went to see her pcp, she advised her that it was a reaction to losartan and to discontinue it. She was given several medications to help her adverse reaction. They have helped, and she seems to be doing better. She wanted to see if you wanted to try another medication. Please advise.

## 2018-05-31 NOTE — Telephone Encounter (Signed)
Pt made aware, voiced understanding. Sent in RX to [facility].

## 2018-05-31 NOTE — Telephone Encounter (Signed)
Returned pt call. She had to go out for a procedure, left message for pt to return call once she returns.

## 2018-05-31 NOTE — Telephone Encounter (Signed)
Noted.  We started Cozaar back in November 2019, so this must be a relatively delayed reaction.  We did start Aldactone recently, but I doubt that this was related.  Agree with stopping Cozaar.  Let us try verapamil ER 120 mg to be taken in the morning.

## 2018-05-31 NOTE — Telephone Encounter (Signed)
Pt had an allergic reaction to the losartan (COZAAR) 50 MG tablet [[id number]] over the weekend. She went to see Pablo Lawrence @ Dr. Durene Cal office and she gave her a shot of prednisone.

## 2018-06-23 ENCOUNTER — Encounter: Payer: Self-pay | Admitting: Gastroenterology

## 2018-06-23 ENCOUNTER — Other Ambulatory Visit: Payer: Self-pay

## 2018-06-23 ENCOUNTER — Ambulatory Visit (INDEPENDENT_AMBULATORY_CARE_PROVIDER_SITE_OTHER): Payer: Medicare HMO | Admitting: Gastroenterology

## 2018-06-23 VITALS — BP 148/85 | HR 109 | Temp 97.5°F | Ht 62.0 in | Wt 162.8 lb

## 2018-06-23 DIAGNOSIS — R1319 Other dysphagia: Secondary | ICD-10-CM

## 2018-06-23 DIAGNOSIS — R1310 Dysphagia, unspecified: Secondary | ICD-10-CM | POA: Diagnosis not present

## 2018-06-23 DIAGNOSIS — K580 Irritable bowel syndrome with diarrhea: Secondary | ICD-10-CM | POA: Diagnosis not present

## 2018-06-23 DIAGNOSIS — K760 Fatty (change of) liver, not elsewhere classified: Secondary | ICD-10-CM | POA: Diagnosis not present

## 2018-06-23 DIAGNOSIS — R197 Diarrhea, unspecified: Secondary | ICD-10-CM

## 2018-06-23 MED ORDER — PANCRELIPASE (LIP-PROT-AMYL) 36000-114000 UNITS PO CPEP: ORAL_CAPSULE | ORAL | 0 refills | Status: AC

## 2018-06-23 NOTE — Progress Notes (Signed)
Primary Care Physician: Celene Squibb, MD  Primary Gastroenterologist:  Garfield Cornea, MD   Chief Complaint  Patient presents with  . Diarrhea    HPI: [name] is a 63 y.o. female here for follow-up of chronic diarrhea.  She is seen back in November.  She has a history of IBS-D, small bowel bacterial overgrowth.  Initially did well on Viberzi but had to discontinue because of prior cholecystectomy.  When I saw her last she was due for surveillance colonoscopy for history of colon polyps.  She also complained of dysphagia.  She underwent EGD and colonoscopy. She had normal TI, random colon biopsies were negative, tubular adenomas removed. Next TCS 3 years. EGD normal, s/p esophageal dilation.   Previously failed high-dose Bentyl and Xifaxan.  Previous CTA in October 2016 with widespread vascular disease but no evidence of mesenteric ischemia.  She has been seen at [facility] for second opinion, they agreed diarrhea likely IBS-D with superimposed small bowel bacterial overgrowth.  CT angios chest/abdomen/pelvis January 2019 with no evidence of significant mesenteric artery stenosis.  Abdominal ultrasound 05/31/2018 showed hepatic steatosis.  LFTs normal 05/2018.  Patient states she is swallowing a lot better after EGD/ED. She is getting more active. Walking five days a week. Trying to cut back smoking. Overall bowel habits stable. Having about 5 stools per day. Occasionally gets up at night with diarrhea. No melena, brbpr. No abd pain or heartburn.    Current Outpatient Medications  Medication Sig Dispense Refill  . Cholecalciferol (VITAMIN D3) 5000 units CAPS Take 5,000 Units by mouth daily.     . Omega-3 Fatty Acids (FISH OIL) 500 MG CAPS Take by mouth daily.    . rosuvastatin (CRESTOR) 5 MG tablet Take 10 mg by mouth daily.     Marland Kitchen spironolactone (ALDACTONE) 25 MG tablet Take 1 tablet (25 mg total) by mouth daily. 90 tablet 3  . verapamil (CALAN-SR) 120 MG CR tablet TAKE I TABLET  BY MOUTH DAILY (IN THE MORNING) 90 tablet 3  . zolpidem (AMBIEN) 10 MG tablet Take 10 mg by mouth at bedtime.      No current facility-administered medications for this visit.     Allergies as of 06/23/2018 - Review Complete 06/23/2018  Allergen Reaction Noted  . Propoxyphene n-acetaminophen Itching   . Tape Other (See Comments) 02/10/2012    ROS:  General: Negative for anorexia, weight loss, fever, chills, fatigue, weakness. ENT: Negative for hoarseness, difficulty swallowing , nasal congestion. CV: Negative for chest pain, angina, palpitations, dyspnea on exertion, peripheral edema.  Respiratory: Negative for dyspnea at rest, dyspnea on exertion, cough, sputum, wheezing.  GI: See history of present illness. GU:  Negative for dysuria, hematuria, urinary incontinence, urinary frequency, nocturnal urination.  Endo: Negative for unusual weight change.    Physical Examination:   BP (!) 148/85   Pulse (!) 109   Temp (!) 97.5 F (36.4 C) (Oral)   Ht 5\' 2"  (1.575 m)   Wt 162 lb 12.8 oz (73.8 kg)   BMI 29.78 kg/m   General: Well-nourished, well-developed in no acute distress.  Eyes: No icterus. Mouth: Oropharyngeal mucosa moist and pink , no lesions erythema or exudate. Lungs: Clear to auscultation bilaterally.  Heart: Regular rate and rhythm, no murmurs rubs or gallops.  Abdomen: Bowel sounds are normal, nontender, nondistended, no hepatosplenomegaly or masses, no abdominal bruits or hernia , no rebound or guarding.   Extremities: No lower extremity edema. No clubbing or deformities. Neuro:  Alert and oriented x 4   Skin: Warm and dry, no jaundice.   Psych: Alert and cooperative, normal mood and affect.  Labs: CBC, LFTs normal 05/2018.  Imaging Studies: US Abdomen Complete  Result Date: 05/31/2018 CLINICAL DATA:  Diffuse abdominal pain for the past 3 months. Elevated LFTs. EXAM: ABDOMEN ULTRASOUND COMPLETE COMPARISON:  CT abdomen pelvis dated September 30, 2017. FINDINGS:  Gallbladder: Surgically absent. Common bile duct: Diameter: 4 mm, normal. Liver: No focal lesion identified. Diffusely increased in parenchymal echogenicity. Portal vein is patent on color Doppler imaging with normal direction of blood flow towards the liver. IVC: Not visualized due to overlying bowel gas. Pancreas: Visualized portion unremarkable. Spleen: Size and appearance within normal limits. Right Kidney: Length: 11.5 cm. Echogenicity within normal limits. No mass or hydronephrosis visualized. Left Kidney: Length: 10.4 cm. Echogenicity within normal limits. No mass or hydronephrosis visualized. Abdominal aorta: Not visualized due to overlying bowel gas. Other findings: None. IMPRESSION: 1. No acute abnormality. 2. Unchanged hepatic steatosis. Electronically Signed   By: Titus Dubin M.D.   On: 05/31/2018 14:58

## 2018-06-23 NOTE — Patient Instructions (Signed)
1. Try creon, 2 capsules with meals and 1 capsule with snacks to see if this helps with diarrhea. Samples provided. Call and let me if helps! 2. Return to the office in 1 year to see Dr. Gala Romney. Call sooner if needed.

## 2018-06-26 ENCOUNTER — Encounter: Payer: Self-pay | Admitting: Gastroenterology

## 2018-06-26 NOTE — Assessment & Plan Note (Signed)
Suspected IBS-D. Failed high dose bentyl. Did well with viberzi but not an option given prior cholecystectomy. Also with prior documented bacterial overgrowth but no improvement with antibiotic therapy in the past. Recent colon biopsies were negative. Discussed trial of pancreatic enzymes (weight based) for next 7-10 days. If no significant improvement, then will stop therapy. Samples provided. She will call with progress report. Return to the office in one year for routine follow up.

## 2018-06-26 NOTE — Assessment & Plan Note (Signed)
Symptoms resolved s/p esophageal dilation.

## 2018-06-26 NOTE — Assessment & Plan Note (Signed)
Normal LFTs last month.   Instructions for fatty liver: Recommend 1-2# weight loss per week until ideal body weight through exercise & diet. Low fat/cholesterol diet.   Avoid sweets, sodas, fruit juices, sweetened beverages like tea, etc. Gradually increase exercise from 15 min daily up to 1 hr per day 5 days/week. Limit alcohol use.

## 2018-06-27 NOTE — Progress Notes (Signed)
CC'D TO PCP °

## 2018-06-29 DIAGNOSIS — R05 Cough: Secondary | ICD-10-CM | POA: Diagnosis not present

## 2018-06-29 DIAGNOSIS — I10 Essential (primary) hypertension: Secondary | ICD-10-CM | POA: Diagnosis not present

## 2018-06-29 DIAGNOSIS — J069 Acute upper respiratory infection, unspecified: Secondary | ICD-10-CM | POA: Diagnosis not present

## 2018-07-20 DIAGNOSIS — I10 Essential (primary) hypertension: Secondary | ICD-10-CM | POA: Diagnosis not present

## 2018-07-20 DIAGNOSIS — R05 Cough: Secondary | ICD-10-CM | POA: Diagnosis not present

## 2018-07-20 DIAGNOSIS — J069 Acute upper respiratory infection, unspecified: Secondary | ICD-10-CM | POA: Diagnosis not present

## 2018-07-20 DIAGNOSIS — R0602 Shortness of breath: Secondary | ICD-10-CM | POA: Diagnosis not present

## 2018-07-25 DIAGNOSIS — R69 Illness, unspecified: Secondary | ICD-10-CM | POA: Diagnosis not present

## 2018-07-25 DIAGNOSIS — I10 Essential (primary) hypertension: Secondary | ICD-10-CM | POA: Diagnosis not present

## 2018-07-25 DIAGNOSIS — R1011 Right upper quadrant pain: Secondary | ICD-10-CM | POA: Diagnosis not present

## 2018-08-11 ENCOUNTER — Telehealth: Payer: Self-pay | Admitting: Cardiology

## 2018-08-11 NOTE — Telephone Encounter (Signed)
Virtual Visit Pre-Appointment Phone Call  "(Name), I am calling you today to discuss your upcoming appointment. We are currently trying to limit exposure to the virus that causes COVID-19 by seeing patients at home rather than in the office."  1. "What is the BEST phone number to call the day of the visit?" - include this in appointment notes  2. Do you have or have access to (through a family member/friend) a smartphone with video capability that we can use for your visit?" a. If yes - list this number in appt notes as cell (if different from BEST phone #) and list the appointment type as a VIDEO visit in appointment notes b. If no - list the appointment type as a PHONE visit in appointment notes  3. Confirm consent - "In the setting of the current Covid19 crisis, you are scheduled for a (phone or video) visit with your provider on (date) at (time).  Just as we do with many in-office visits, in order for you to participate in this visit, we must obtain consent.  If you'd like, I can send this to your mychart (if signed up) or email for you to review.  Otherwise, I can obtain your verbal consent now.  All virtual visits are billed to your insurance company just like a normal visit would be.  By agreeing to a virtual visit, we'd like you to understand that the technology does not allow for your provider to perform an examination, and thus may limit your provider's ability to fully assess your condition. If your provider identifies any concerns that need to be evaluated in person, we will make arrangements to do so.  Finally, though the technology is pretty good, we cannot assure that it will always work on either your or our end, and in the setting of a video visit, we may have to convert it to a phone-only visit.  In either situation, we cannot ensure that we have a secure connection.  Are you willing to proceed?" STAFF: Did the patient verbally acknowledge consent to telehealth visit? Document  YES/NO here: Yes  4. Advise patient to be prepared - "Two hours prior to your appointment, go ahead and check your blood pressure, pulse, oxygen saturation, and your weight (if you have the equipment to check those) and write them all down. When your visit starts, your provider will ask you for this information. If you have an Apple Watch or Kardia device, please plan to have heart rate information ready on the day of your appointment. Please have a pen and paper handy nearby the day of the visit as well."  5. Give patient instructions for MyChart download to smartphone OR [email or web address] as below if video visit (depending on what platform provider is using)  6. Inform patient they will receive a phone call 15 minutes prior to their appointment time (may be from unknown caller ID) so they should be prepared to answer    [place] has been deemed a candidate for a follow-up tele-health visit to limit community exposure during the Covid-19 pandemic. I spoke with the patient via phone to ensure availability of phone/video source, confirm preferred email & phone number, and discuss instructions and expectations.  I reminded Henrene Hawking to be prepared with any vital sign and/or heart rhythm information that could potentially be obtained via home monitoring, at the time of her visit. I reminded Henrene Hawking to expect a phone call prior to  her visit.  Bertram Gala Goins 08/11/2018 11:43 AM
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
## 2018-08-22 ENCOUNTER — Encounter: Payer: Self-pay | Admitting: Cardiology

## 2018-08-22 ENCOUNTER — Telehealth (INDEPENDENT_AMBULATORY_CARE_PROVIDER_SITE_OTHER): Payer: Medicare HMO | Admitting: Cardiology

## 2018-08-22 VITALS — BP 184/76 | HR 78 | Ht 61.0 in | Wt 147.0 lb

## 2018-08-22 DIAGNOSIS — I6523 Occlusion and stenosis of bilateral carotid arteries: Secondary | ICD-10-CM

## 2018-08-22 DIAGNOSIS — I10 Essential (primary) hypertension: Secondary | ICD-10-CM

## 2018-08-22 DIAGNOSIS — Z7189 Other specified counseling: Secondary | ICD-10-CM

## 2018-08-22 DIAGNOSIS — Z833 Family history of diabetes mellitus: Secondary | ICD-10-CM | POA: Diagnosis not present

## 2018-08-22 DIAGNOSIS — G8929 Other chronic pain: Secondary | ICD-10-CM | POA: Diagnosis not present

## 2018-08-22 DIAGNOSIS — G4700 Insomnia, unspecified: Secondary | ICD-10-CM | POA: Diagnosis not present

## 2018-08-22 DIAGNOSIS — Z720 Tobacco use: Secondary | ICD-10-CM

## 2018-08-22 DIAGNOSIS — R69 Illness, unspecified: Secondary | ICD-10-CM | POA: Diagnosis not present

## 2018-08-22 MED ORDER — VERAPAMIL HCL ER 180 MG PO TBCR: 180.0000 mg | EXTENDED_RELEASE_TABLET | Freq: Every day | ORAL | 3 refills | Status: AC

## 2018-08-22 NOTE — Patient Instructions (Signed)
Medication Instructions: INCREASE Calan SR to 180 mg daily  Labwork: None today  Procedures/Testing: None today  Follow-Up: 3 months with Dr.McDowell  Any Additional Special Instructions Will Be Listed Below (If Applicable).     If you need a refill on your cardiac medications before your next appointment, please call your pharmacy.

## 2018-08-24 DIAGNOSIS — R1011 Right upper quadrant pain: Secondary | ICD-10-CM | POA: Diagnosis not present

## 2018-08-30 ENCOUNTER — Other Ambulatory Visit: Payer: Self-pay | Admitting: Internal Medicine

## 2018-08-30 DIAGNOSIS — R634 Abnormal weight loss: Secondary | ICD-10-CM

## 2018-08-30 DIAGNOSIS — R1084 Generalized abdominal pain: Secondary | ICD-10-CM

## 2018-09-12 ENCOUNTER — Other Ambulatory Visit: Payer: Self-pay | Admitting: Internal Medicine

## 2018-09-12 DIAGNOSIS — R1084 Generalized abdominal pain: Secondary | ICD-10-CM

## 2018-09-12 DIAGNOSIS — R109 Unspecified abdominal pain: Secondary | ICD-10-CM

## 2018-09-16 DIAGNOSIS — I10 Essential (primary) hypertension: Secondary | ICD-10-CM | POA: Diagnosis not present

## 2018-09-16 DIAGNOSIS — R69 Illness, unspecified: Secondary | ICD-10-CM | POA: Diagnosis not present

## 2018-09-16 DIAGNOSIS — L2084 Intrinsic (allergic) eczema: Secondary | ICD-10-CM | POA: Diagnosis not present

## 2018-09-16 DIAGNOSIS — G4700 Insomnia, unspecified: Secondary | ICD-10-CM | POA: Diagnosis not present

## 2018-09-20 ENCOUNTER — Ambulatory Visit (HOSPITAL_COMMUNITY): Payer: Medicare HMO

## 2018-10-27 DIAGNOSIS — E559 Vitamin D deficiency, unspecified: Secondary | ICD-10-CM | POA: Diagnosis not present

## 2018-10-27 DIAGNOSIS — E782 Mixed hyperlipidemia: Secondary | ICD-10-CM | POA: Diagnosis not present

## 2018-10-27 DIAGNOSIS — I10 Essential (primary) hypertension: Secondary | ICD-10-CM | POA: Diagnosis not present

## 2018-11-02 DIAGNOSIS — R7303 Prediabetes: Secondary | ICD-10-CM | POA: Diagnosis not present

## 2018-11-02 DIAGNOSIS — E782 Mixed hyperlipidemia: Secondary | ICD-10-CM | POA: Diagnosis not present

## 2018-11-02 DIAGNOSIS — R109 Unspecified abdominal pain: Secondary | ICD-10-CM | POA: Diagnosis not present

## 2018-11-02 DIAGNOSIS — E6609 Other obesity due to excess calories: Secondary | ICD-10-CM | POA: Diagnosis not present

## 2018-11-02 DIAGNOSIS — Z6830 Body mass index (BMI) 30.0-30.9, adult: Secondary | ICD-10-CM | POA: Diagnosis not present

## 2018-11-02 DIAGNOSIS — E559 Vitamin D deficiency, unspecified: Secondary | ICD-10-CM | POA: Diagnosis not present

## 2018-11-02 DIAGNOSIS — I10 Essential (primary) hypertension: Secondary | ICD-10-CM | POA: Diagnosis not present

## 2018-11-02 DIAGNOSIS — R69 Illness, unspecified: Secondary | ICD-10-CM | POA: Diagnosis not present

## 2018-11-02 DIAGNOSIS — R7301 Impaired fasting glucose: Secondary | ICD-10-CM | POA: Diagnosis not present

## 2018-11-16 DIAGNOSIS — R69 Illness, unspecified: Secondary | ICD-10-CM | POA: Diagnosis not present
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
## 2018-11-28 ENCOUNTER — Ambulatory Visit: Payer: Medicare HMO | Admitting: Cardiology

## 2018-12-06 DIAGNOSIS — M545 Low back pain: Secondary | ICD-10-CM | POA: Diagnosis not present

## 2018-12-06 DIAGNOSIS — M546 Pain in thoracic spine: Secondary | ICD-10-CM | POA: Diagnosis not present

## 2018-12-09 DIAGNOSIS — G4700 Insomnia, unspecified: Secondary | ICD-10-CM | POA: Diagnosis not present

## 2018-12-09 DIAGNOSIS — R69 Illness, unspecified: Secondary | ICD-10-CM | POA: Diagnosis not present

## 2019-01-11 ENCOUNTER — Ambulatory Visit: Payer: Medicare HMO | Admitting: Cardiology

## 2019-01-17 DIAGNOSIS — Z23 Encounter for immunization: Secondary | ICD-10-CM | POA: Diagnosis not present

## 2019-01-17 DIAGNOSIS — R69 Illness, unspecified: Secondary | ICD-10-CM | POA: Diagnosis not present

## 2019-01-17 DIAGNOSIS — N393 Stress incontinence (female) (male): Secondary | ICD-10-CM | POA: Diagnosis not present

## 2019-01-17 DIAGNOSIS — G4700 Insomnia, unspecified: Secondary | ICD-10-CM | POA: Diagnosis not present

## 2019-02-17 ENCOUNTER — Ambulatory Visit: Payer: Medicare HMO | Admitting: Cardiology
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
## 2019-02-20 DIAGNOSIS — L659 Nonscarring hair loss, unspecified: Secondary | ICD-10-CM | POA: Diagnosis not present

## 2019-02-20 DIAGNOSIS — Z7689 Persons encountering health services in other specified circumstances: Secondary | ICD-10-CM | POA: Diagnosis not present

## 2019-02-20 DIAGNOSIS — Z6830 Body mass index (BMI) 30.0-30.9, adult: Secondary | ICD-10-CM | POA: Diagnosis not present

## 2019-02-20 DIAGNOSIS — E661 Drug-induced obesity: Secondary | ICD-10-CM | POA: Diagnosis not present

## 2019-02-20 DIAGNOSIS — Z712 Person consulting for explanation of examination or test findings: Secondary | ICD-10-CM | POA: Diagnosis not present

## 2019-02-20 DIAGNOSIS — Z6829 Body mass index (BMI) 29.0-29.9, adult: Secondary | ICD-10-CM | POA: Diagnosis not present

## 2019-02-20 DIAGNOSIS — E6609 Other obesity due to excess calories: Secondary | ICD-10-CM | POA: Diagnosis not present

## 2019-02-20 DIAGNOSIS — I10 Essential (primary) hypertension: Secondary | ICD-10-CM | POA: Diagnosis not present

## 2019-02-20 DIAGNOSIS — R7303 Prediabetes: Secondary | ICD-10-CM | POA: Diagnosis not present

## 2019-02-20 DIAGNOSIS — R220 Localized swelling, mass and lump, head: Secondary | ICD-10-CM | POA: Diagnosis not present

## 2019-02-20 DIAGNOSIS — R0602 Shortness of breath: Secondary | ICD-10-CM | POA: Diagnosis not present

## 2019-02-20 DIAGNOSIS — M791 Myalgia, unspecified site: Secondary | ICD-10-CM | POA: Diagnosis not present

## 2019-02-20 DIAGNOSIS — R69 Illness, unspecified: Secondary | ICD-10-CM | POA: Diagnosis not present

## 2019-02-20 DIAGNOSIS — R7301 Impaired fasting glucose: Secondary | ICD-10-CM | POA: Diagnosis not present

## 2019-02-20 DIAGNOSIS — E876 Hypokalemia: Secondary | ICD-10-CM | POA: Diagnosis not present

## 2019-02-21 DIAGNOSIS — R7301 Impaired fasting glucose: Secondary | ICD-10-CM | POA: Diagnosis not present

## 2019-02-21 DIAGNOSIS — M545 Low back pain: Secondary | ICD-10-CM | POA: Diagnosis not present

## 2019-02-21 DIAGNOSIS — I10 Essential (primary) hypertension: Secondary | ICD-10-CM | POA: Diagnosis not present

## 2019-02-21 DIAGNOSIS — E6609 Other obesity due to excess calories: Secondary | ICD-10-CM | POA: Diagnosis not present

## 2019-02-21 DIAGNOSIS — E559 Vitamin D deficiency, unspecified: Secondary | ICD-10-CM | POA: Diagnosis not present

## 2019-02-21 DIAGNOSIS — R7303 Prediabetes: Secondary | ICD-10-CM | POA: Diagnosis not present

## 2019-02-21 DIAGNOSIS — R69 Illness, unspecified: Secondary | ICD-10-CM | POA: Diagnosis not present

## 2019-02-21 DIAGNOSIS — R5383 Other fatigue: Secondary | ICD-10-CM | POA: Diagnosis not present

## 2019-02-21 DIAGNOSIS — E782 Mixed hyperlipidemia: Secondary | ICD-10-CM | POA: Diagnosis not present

## 2019-02-21 DIAGNOSIS — R109 Unspecified abdominal pain: Secondary | ICD-10-CM | POA: Diagnosis not present

## 2019-02-24 ENCOUNTER — Ambulatory Visit: Payer: Medicare HMO | Admitting: Cardiology

## 2019-02-24 ENCOUNTER — Encounter: Payer: Self-pay | Admitting: Cardiology

## 2019-02-24 ENCOUNTER — Other Ambulatory Visit: Payer: Self-pay

## 2019-02-24 VITALS — BP 144/71 | HR 74 | Temp 97.8°F | Ht 61.0 in | Wt 156.0 lb

## 2019-02-24 DIAGNOSIS — I6523 Occlusion and stenosis of bilateral carotid arteries: Secondary | ICD-10-CM | POA: Diagnosis not present

## 2019-02-24 DIAGNOSIS — I10 Essential (primary) hypertension: Secondary | ICD-10-CM

## 2019-02-24 DIAGNOSIS — Z720 Tobacco use: Secondary | ICD-10-CM
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
## 2019-02-24 NOTE — Patient Instructions (Signed)
Medication Instructions:  Your physician recommends that you continue on your current medications as directed. Please refer to the Current Medication list given to you today.  *If you need a refill on your cardiac medications before your next appointment, please call your pharmacy*  Lab Work: None today If you have labs (blood work) drawn today and your tests are completely normal, you will receive your results only by: Marland Kitchen MyChart Message (if you have MyChart) OR . A paper copy in the mail If you have any lab test that is abnormal or we need to change your treatment, we will call you to review the results.  Testing/Procedures: None today  Follow-Up: At [facility], you and your health needs are our priority.  As part of our continuing mission to provide you with exceptional heart care, we have created designated Provider Care Teams.  These Care Teams include your primary Cardiologist (physician) and Advanced Practice Providers (APPs -  Physician Assistants and Nurse Practitioners) who all work together to provide you with the care you need, when you need it.  Your next appointment:   As needed, enjoy [place] !!       Thank you for choosing [facility] !

## 2019-03-15 ENCOUNTER — Ambulatory Visit: Payer: Medicare HMO | Admitting: Cardiology

## 2019-03-20 DIAGNOSIS — M25559 Pain in unspecified hip: Secondary | ICD-10-CM | POA: Diagnosis not present

## 2019-03-20 DIAGNOSIS — K219 Gastro-esophageal reflux disease without esophagitis: Secondary | ICD-10-CM | POA: Diagnosis not present

## 2019-03-20 DIAGNOSIS — N760 Acute vaginitis: Secondary | ICD-10-CM | POA: Diagnosis not present

## 2019-03-22 DIAGNOSIS — R5383 Other fatigue: Secondary | ICD-10-CM | POA: Diagnosis not present

## 2019-03-22 DIAGNOSIS — R69 Illness, unspecified: Secondary | ICD-10-CM | POA: Diagnosis not present

## 2019-04-26 DIAGNOSIS — M5126 Other intervertebral disc displacement, lumbar region: Secondary | ICD-10-CM | POA: Diagnosis not present

## 2019-04-26 DIAGNOSIS — E785 Hyperlipidemia, unspecified: Secondary | ICD-10-CM | POA: Diagnosis not present

## 2019-04-26 DIAGNOSIS — E559 Vitamin D deficiency, unspecified: Secondary | ICD-10-CM | POA: Diagnosis not present

## 2019-04-26 DIAGNOSIS — K635 Polyp of colon: Secondary | ICD-10-CM | POA: Diagnosis not present

## 2019-04-26 DIAGNOSIS — Z6827 Body mass index (BMI) 27.0-27.9, adult: Secondary | ICD-10-CM | POA: Diagnosis not present

## 2019-04-26 DIAGNOSIS — I10 Essential (primary) hypertension: Secondary | ICD-10-CM | POA: Diagnosis not present

## 2019-05-05 DIAGNOSIS — S8002XA Contusion of left knee, initial encounter: Secondary | ICD-10-CM | POA: Diagnosis not present

## 2019-05-05 DIAGNOSIS — W19XXXA Unspecified fall, initial encounter: Secondary | ICD-10-CM | POA: Diagnosis not present

## 2019-05-05 DIAGNOSIS — R69 Illness, unspecified: Secondary | ICD-10-CM | POA: Diagnosis not present

## 2019-05-09 DIAGNOSIS — E782 Mixed hyperlipidemia: Secondary | ICD-10-CM | POA: Diagnosis not present

## 2019-05-09 DIAGNOSIS — R011 Cardiac murmur, unspecified: Secondary | ICD-10-CM | POA: Diagnosis not present

## 2019-05-09 DIAGNOSIS — I10 Essential (primary) hypertension: Secondary | ICD-10-CM | POA: Diagnosis not present

## 2019-05-09 DIAGNOSIS — R0609 Other forms of dyspnea: Secondary | ICD-10-CM | POA: Diagnosis not present

## 2019-05-09 DIAGNOSIS — Z6827 Body mass index (BMI) 27.0-27.9, adult: Secondary | ICD-10-CM | POA: Diagnosis not present

## 2019-05-10 DIAGNOSIS — Z6827 Body mass index (BMI) 27.0-27.9, adult: Secondary | ICD-10-CM | POA: Diagnosis not present

## 2019-05-10 DIAGNOSIS — M47896 Other spondylosis, lumbar region: Secondary | ICD-10-CM | POA: Diagnosis not present

## 2019-05-10 DIAGNOSIS — M961 Postlaminectomy syndrome, not elsewhere classified: Secondary | ICD-10-CM | POA: Diagnosis not present

## 2019-05-10 DIAGNOSIS — M546 Pain in thoracic spine: Secondary | ICD-10-CM | POA: Diagnosis not present

## 2019-05-10 DIAGNOSIS — M5416 Radiculopathy, lumbar region: Secondary | ICD-10-CM | POA: Diagnosis not present

## 2019-05-12 DIAGNOSIS — E785 Hyperlipidemia, unspecified: Secondary | ICD-10-CM | POA: Diagnosis not present

## 2019-05-12 DIAGNOSIS — E559 Vitamin D deficiency, unspecified: Secondary | ICD-10-CM | POA: Diagnosis not present

## 2019-05-12 DIAGNOSIS — N390 Urinary tract infection, site not specified: Secondary | ICD-10-CM | POA: Diagnosis not present

## 2019-05-29 ENCOUNTER — Encounter: Payer: Self-pay | Admitting: Internal Medicine

## 2021-04-28 ENCOUNTER — Encounter: Payer: Self-pay | Admitting: *Deleted

## 2021-05-20 ENCOUNTER — Encounter: Payer: Self-pay | Admitting: *Deleted

## 2021-07-17 ENCOUNTER — Ambulatory Visit: Payer: Self-pay
